# Patient Record
Sex: Female | Born: 1935 | Race: White | Hispanic: No | Marital: Married | Smoking: Former smoker
Health system: Southern US, Community
[De-identification: ages and names within clinical notes are randomized; demographics above are authoritative.]

## PROBLEM LIST (undated history)

## (undated) DIAGNOSIS — I10 Essential (primary) hypertension: Secondary | ICD-10-CM

## (undated) DIAGNOSIS — K5730 Diverticulosis of large intestine without perforation or abscess without bleeding: Secondary | ICD-10-CM

## (undated) DIAGNOSIS — M810 Age-related osteoporosis without current pathological fracture: Secondary | ICD-10-CM

## (undated) DIAGNOSIS — E559 Vitamin D deficiency, unspecified: Secondary | ICD-10-CM

## (undated) DIAGNOSIS — J449 Chronic obstructive pulmonary disease, unspecified: Secondary | ICD-10-CM

## (undated) DIAGNOSIS — E785 Hyperlipidemia, unspecified: Secondary | ICD-10-CM

## (undated) DIAGNOSIS — C801 Malignant (primary) neoplasm, unspecified: Secondary | ICD-10-CM

## (undated) HISTORY — DX: Essential (primary) hypertension: I10

## (undated) HISTORY — DX: Hyperlipidemia, unspecified: E78.5

## (undated) HISTORY — DX: Age-related osteoporosis without current pathological fracture: M81.0

## (undated) HISTORY — DX: Vitamin D deficiency, unspecified: E55.9

## (undated) HISTORY — DX: Diverticulosis of large intestine without perforation or abscess without bleeding: K57.30

## (undated) HISTORY — DX: Chronic obstructive pulmonary disease, unspecified: J44.9

---

## 1997-10-07 ENCOUNTER — Other Ambulatory Visit: Admission: RE | Admit: 1997-10-07 | Discharge: 1997-10-07 | Payer: Self-pay | Admitting: *Deleted

## 1998-01-02 ENCOUNTER — Other Ambulatory Visit: Admission: RE | Admit: 1998-01-02 | Discharge: 1998-01-02 | Payer: Self-pay | Admitting: Gastroenterology

## 1998-05-22 ENCOUNTER — Ambulatory Visit (HOSPITAL_COMMUNITY): Admission: RE | Admit: 1998-05-22 | Discharge: 1998-05-22 | Payer: Self-pay | Admitting: Gastroenterology

## 1999-08-07 ENCOUNTER — Other Ambulatory Visit: Admission: RE | Admit: 1999-08-07 | Discharge: 1999-08-07 | Payer: Self-pay | Admitting: *Deleted

## 1999-08-10 ENCOUNTER — Encounter: Payer: Self-pay | Admitting: *Deleted

## 1999-08-10 ENCOUNTER — Encounter: Admission: RE | Admit: 1999-08-10 | Discharge: 1999-08-10 | Payer: Self-pay | Admitting: *Deleted

## 2001-11-27 ENCOUNTER — Encounter: Admission: RE | Admit: 2001-11-27 | Discharge: 2001-11-27 | Payer: Self-pay

## 2001-12-07 ENCOUNTER — Other Ambulatory Visit: Admission: RE | Admit: 2001-12-07 | Discharge: 2001-12-07 | Payer: Self-pay | Admitting: Family Medicine

## 2002-06-02 ENCOUNTER — Encounter: Payer: Self-pay | Admitting: *Deleted

## 2002-06-02 ENCOUNTER — Encounter: Admission: RE | Admit: 2002-06-02 | Discharge: 2002-06-02 | Payer: Self-pay | Admitting: *Deleted

## 2002-12-17 ENCOUNTER — Encounter: Admission: RE | Admit: 2002-12-17 | Discharge: 2002-12-17 | Payer: Self-pay

## 2003-12-19 ENCOUNTER — Ambulatory Visit (HOSPITAL_COMMUNITY): Admission: RE | Admit: 2003-12-19 | Discharge: 2003-12-19 | Payer: Self-pay | Admitting: Family Medicine

## 2004-03-23 ENCOUNTER — Encounter: Payer: Self-pay | Admitting: Internal Medicine

## 2005-01-31 ENCOUNTER — Encounter: Admission: RE | Admit: 2005-01-31 | Discharge: 2005-01-31 | Payer: Self-pay | Admitting: Family Medicine

## 2005-02-22 ENCOUNTER — Ambulatory Visit (HOSPITAL_COMMUNITY): Admission: RE | Admit: 2005-02-22 | Discharge: 2005-02-22 | Payer: Self-pay | Admitting: Family Medicine

## 2005-03-07 HISTORY — PX: VERTEBROPLASTY: SHX113

## 2005-03-22 ENCOUNTER — Encounter: Admission: RE | Admit: 2005-03-22 | Discharge: 2005-03-22 | Payer: Self-pay | Admitting: Family Medicine

## 2005-03-28 ENCOUNTER — Encounter: Admission: RE | Admit: 2005-03-28 | Discharge: 2005-03-28 | Payer: Self-pay | Admitting: Orthopedic Surgery

## 2005-03-29 ENCOUNTER — Encounter (INDEPENDENT_AMBULATORY_CARE_PROVIDER_SITE_OTHER): Payer: Self-pay | Admitting: *Deleted

## 2005-03-29 ENCOUNTER — Encounter: Admission: RE | Admit: 2005-03-29 | Discharge: 2005-03-29 | Payer: Self-pay | Admitting: Family Medicine

## 2005-04-29 ENCOUNTER — Encounter: Admission: RE | Admit: 2005-04-29 | Discharge: 2005-04-29 | Payer: Self-pay | Admitting: Orthopedic Surgery

## 2005-05-09 ENCOUNTER — Encounter: Admission: RE | Admit: 2005-05-09 | Discharge: 2005-05-09 | Payer: Self-pay | Admitting: Orthopedic Surgery

## 2005-07-05 HISTORY — PX: CYSTOSCOPY: SUR368

## 2006-03-03 ENCOUNTER — Ambulatory Visit (HOSPITAL_COMMUNITY): Admission: RE | Admit: 2006-03-03 | Discharge: 2006-03-03 | Payer: Self-pay | Admitting: Family Medicine

## 2006-06-04 ENCOUNTER — Encounter: Admission: RE | Admit: 2006-06-04 | Discharge: 2006-06-04 | Payer: Self-pay | Admitting: Family Medicine

## 2007-03-06 ENCOUNTER — Ambulatory Visit (HOSPITAL_COMMUNITY): Admission: RE | Admit: 2007-03-06 | Discharge: 2007-03-06 | Payer: Self-pay | Admitting: *Deleted

## 2007-04-24 ENCOUNTER — Encounter: Payer: Self-pay | Admitting: Internal Medicine

## 2007-04-24 LAB — CONVERTED CEMR LAB
AST: 27 units/L
Albumin: 4.6 g/dL
Alkaline Phosphatase: 70 units/L
BUN: 15 mg/dL
Basophils Relative: 1 %
Calcium: 9.9 mg/dL
Cholesterol: 231 mg/dL
Creatinine, Ser: 0.64 mg/dL
Glucose, Bld: 95 mg/dL
HCT: 42.7 %
Hemoglobin: 14.1 g/dL
Platelets: 275 10*3/uL
RBC: 4.5 M/uL
Sodium: 141 meq/L
WBC: 5.9 10*3/uL

## 2007-07-06 LAB — HM COLONOSCOPY: HM Colonoscopy: NORMAL

## 2008-03-02 ENCOUNTER — Encounter: Admission: RE | Admit: 2008-03-02 | Discharge: 2008-03-02 | Payer: Self-pay | Admitting: Orthopedic Surgery

## 2008-07-11 ENCOUNTER — Ambulatory Visit (HOSPITAL_COMMUNITY): Admission: RE | Admit: 2008-07-11 | Discharge: 2008-07-11 | Payer: Self-pay | Admitting: Family Medicine

## 2009-03-08 ENCOUNTER — Encounter: Admission: RE | Admit: 2009-03-08 | Discharge: 2009-03-08 | Payer: Self-pay | Admitting: Orthopedic Surgery

## 2009-03-08 ENCOUNTER — Encounter: Payer: Self-pay | Admitting: Orthopedic Surgery

## 2009-03-10 ENCOUNTER — Ambulatory Visit (HOSPITAL_COMMUNITY): Admission: RE | Admit: 2009-03-10 | Discharge: 2009-03-10 | Payer: Self-pay | Admitting: Interventional Radiology

## 2009-03-17 LAB — HM MAMMOGRAPHY

## 2009-03-18 ENCOUNTER — Encounter: Admission: RE | Admit: 2009-03-18 | Discharge: 2009-03-18 | Payer: Self-pay | Admitting: Family Medicine

## 2009-03-22 ENCOUNTER — Encounter: Payer: Self-pay | Admitting: Internal Medicine

## 2009-03-22 LAB — CONVERTED CEMR LAB
Albumin: 4.2 g/dL
CO2: 26 meq/L
Chloride: 100 meq/L
Creatinine, Ser: 0.58 mg/dL
Eosinophils Relative: 2 %
MCV: 93 fL
Neutrophils Relative %: 68 %
Potassium: 4.3 meq/L
RBC: 4.35 M/uL
RDW: 12.8 %
Total Bilirubin: 0.4 mg/dL
Total Protein: 6.8 g/dL

## 2009-03-24 ENCOUNTER — Encounter: Payer: Self-pay | Admitting: Interventional Radiology

## 2009-10-12 ENCOUNTER — Encounter: Payer: Self-pay | Admitting: Internal Medicine

## 2009-10-12 LAB — CONVERTED CEMR LAB
AST: 36 units/L
Basophils Relative: 0 %
CO2: 24 meq/L
Cholesterol: 239 mg/dL
Eosinophils Relative: 1 %
LDL Cholesterol: 104 mg/dL
Lymphocytes, automated: 20 %
Neutrophils Relative %: 69 %
Platelets: 286 10*3/uL
Potassium: 4.6 meq/L
RDW: 13.7 %
Sodium: 138 meq/L
Triglyceride fasting, serum: 91 mg/dL
WBC: 9 10*3/uL

## 2009-11-17 ENCOUNTER — Encounter: Payer: Self-pay | Admitting: Internal Medicine

## 2009-11-17 ENCOUNTER — Emergency Department (HOSPITAL_COMMUNITY): Admission: EM | Admit: 2009-11-17 | Discharge: 2009-11-17 | Payer: Self-pay | Admitting: Emergency Medicine

## 2009-11-17 LAB — CONVERTED CEMR LAB
BUN: 6 mg/dL
Basophils Relative: 0 %
Creatinine, Ser: 0.5 mg/dL
Glucose, Bld: 95 mg/dL
Hemoglobin: 16.3 g/dL
Platelets: 241 10*3/uL
RBC: 4.53 M/uL
Sodium: 138 meq/L
WBC: 5.9 10*3/uL

## 2009-12-19 ENCOUNTER — Encounter: Admission: RE | Admit: 2009-12-19 | Discharge: 2009-12-19 | Payer: Self-pay | Admitting: Orthopedic Surgery

## 2010-01-11 ENCOUNTER — Encounter: Payer: Self-pay | Admitting: Internal Medicine

## 2010-03-15 ENCOUNTER — Encounter: Payer: Self-pay | Admitting: Internal Medicine

## 2010-03-15 DIAGNOSIS — M810 Age-related osteoporosis without current pathological fracture: Secondary | ICD-10-CM | POA: Insufficient documentation

## 2010-03-19 ENCOUNTER — Ambulatory Visit (INDEPENDENT_AMBULATORY_CARE_PROVIDER_SITE_OTHER): Payer: Medicare Other | Admitting: Internal Medicine

## 2010-03-19 ENCOUNTER — Encounter: Payer: Self-pay | Admitting: Internal Medicine

## 2010-03-19 ENCOUNTER — Other Ambulatory Visit: Payer: Self-pay | Admitting: Internal Medicine

## 2010-03-19 DIAGNOSIS — E559 Vitamin D deficiency, unspecified: Secondary | ICD-10-CM | POA: Insufficient documentation

## 2010-03-19 DIAGNOSIS — I10 Essential (primary) hypertension: Secondary | ICD-10-CM

## 2010-03-19 DIAGNOSIS — Z9189 Other specified personal risk factors, not elsewhere classified: Secondary | ICD-10-CM | POA: Insufficient documentation

## 2010-03-19 DIAGNOSIS — Z1231 Encounter for screening mammogram for malignant neoplasm of breast: Secondary | ICD-10-CM

## 2010-03-19 DIAGNOSIS — K5730 Diverticulosis of large intestine without perforation or abscess without bleeding: Secondary | ICD-10-CM | POA: Insufficient documentation

## 2010-03-19 DIAGNOSIS — M810 Age-related osteoporosis without current pathological fracture: Secondary | ICD-10-CM

## 2010-03-19 DIAGNOSIS — E785 Hyperlipidemia, unspecified: Secondary | ICD-10-CM | POA: Insufficient documentation

## 2010-03-28 NOTE — Assessment & Plan Note (Signed)
Summary: NEW/MEDICARE/UHC/NWS #   Vital Signs:  Patient profile:   77 year old female Height:      58 inches (147.32 cm) Weight:      92.12 pounds (41.87 kg) BMI:     19.32 O2 Sat:      95 % on Room air Temp:     98.5 degrees F (36.94 degrees C) oral Pulse rate:   84 / minute BP sitting:   130 / 86  (left arm) Cuff size:   regular  Vitals Entered By: Orlan Leavens RMA (March 19, 2010 8:12 AM)  O2 Flow:  Room air CC: New patient Is Patient Diabetic? No Pain Assessment Patient in pain? no        Primary Care Provider:  Newt Lukes MD  CC:  New patient.  History of Present Illness: new pt to me and our practice, here to est care prev with mazzochi and stone  reviewed chronic med issues: HTN - started ARB for same fall 2011 -reports compliance with ongoing medical treatment and no changes in medication dose or frequency. denies adverse side effects related to current therapy.   osteoporosis - hx compression fx, KP T10 03/2009-  prev tx efforts with actonel, boniva and fosamax untol due to GI SE - takes Calcium + vit d daily - WB exercise - walking as tol -   dyslipidemia - never rec rx med for same - controls with diet and exercise  Preventive Screening-Counseling & Management  Alcohol-Tobacco     Alcohol drinks/day: <1     Alcohol Counseling: not indicated; patient does not drink     Smoking Status: quit     Year Started: 1980     Tobacco Counseling: not to resume use of tobacco products  Caffeine-Diet-Exercise     Does Patient Exercise: no     Exercise Counseling: not indicated; exercise is adequate     Depression Counseling: not indicated; screening negative for depression  Safety-Violence-Falls     Seat Belt Counseling: not indicated; patient wears seat belts     Helmet Counseling: not applicable     Firearm Counseling: not indicated; uses recommended firearm safety measures     Violence Counseling: not indicated; no violence risk noted     Fall Risk  Counseling: not indicated; no significant falls noted  Clinical Review Panels:  Prevention   [facility]  [facility] No specific mammographic evidence of malignancy.  Assessment: BIRADS 1. (03/17/2009)   Last Colonoscopy:  Pt states done by Dr. Ewing Schlein, results normal (07/06/2007)  Immunizations   Last Tetanus Booster:  Historical (02/04/1997)   Last Flu Vaccine:  Historical (11/04/2009)  Lipid Management   Cholesterol:  239 (10/12/2009)   LDL (bad choesterol):  104 (10/12/2009)   HDL (good cholesterol):  117 (10/12/2009)   Triglycerides:  91 (10/12/2009)  CBC   WBC:  5.9 (11/17/2009)   RBC:  4.53 (11/17/2009)   Hgb:  16.3 (11/17/2009)   Hct:  48.0 (11/17/2009)   Platelets:  241 (11/17/2009)   MCV  94.9 (11/17/2009)   RDW  13.9 (11/17/2009)   PMN:  64 (11/17/2009)   Monos:  11 (11/17/2009)   Eosinophils:  5 (11/17/2009)   Basophil:  0 (11/17/2009)  Complete Metabolic Panel   Glucose:  95 (11/17/2009)   Sodium:  138 (11/17/2009)   Potassium:  4.5 (11/17/2009)   Chloride:  101 (11/17/2009)   CO2:  24 (10/12/2009)   BUN:  6 (11/17/2009)   Creatinine:  0.5 (  11/17/2009)   Albumin:  4.5 (10/12/2009)   Total Protein:  7.7 (10/12/2009)   Calcium:  10.3 (10/12/2009)   Total Bili:  0.6 (10/12/2009)   Alk Phos:  81 (10/12/2009)   SGPT (ALT):  19 (10/12/2009)   SGOT (AST):  36 (10/12/2009)   -  Date:  10/12/2009    TSH: 0.776    CO2 Total: 24    SGOT (AST): 36    SGPT (ALT): 19    T. Bilirubin: 0.6    Alk Phos: 81    Calcium: 10.3    Total Protein: 7.7    Albumin: 4.5    Cholesterol: 239    LDL: 104    HDL: 117    Triglycerides: 91  Date:  03/22/2009    CO2 Total: 26    SGOT (AST): 24    SGPT (ALT): 11    T. Bilirubin: 0.4    Alk Phos: 106    Calcium: 9.2    Total Protein: 6.8    Albumin: 4.2  Date:  04/24/2007    CO2 Total: 24    SGOT (AST): 27    SGPT (ALT): 10    T. Bilirubin: 0.6    Alk Phos: 70    Calcium: 9.9    Total Protein:  7.5    Albumin: 4.6    Cholesterol: 231    LDL: 108    HDL: 101    Triglycerides: 112  Current Medications (verified): 1)  Gummy Multivitamin .... Once Daily 2)  Losartan Potassium 25 Mg Tabs (Losartan Potassium) .... Take 1 By Mouth Once Daily 3)  Citracal Calcium+d 600-40-500 Mg-Mg-Unit Xr24h-Tab (Calcium-Magnesium-Vitamin D) .... Take 2 By Mouth Once Daily 4)  Vitamin D 1000 Unit Tabs (Cholecalciferol) .... Take 1 By Mouth Once Daily  Allergies: 1)  ! Penicillin 2)  ! Aspirin 3)  ! * Shrimp  Past History:  Past Medical History: Osteoporosis Hypertension Diverticulosis, colon Hyperlipidemia  MD roster: ortho - [facility] - [facility]  Past Surgical History: Cystoscopy (07/2005)  Vertebroplasty (03/2005) Dr. Deanne Coffer  Family History: Mother died in her 88's from heart disease, HTN Father died @ 75 from heart disease, HTN 1 brother died from pancreatic cancer age 3y  Social History: Former Smoker, quit 15 years ago married, lives with spouse retired Does Patient Exercise:  no  Review of Systems       see HPI above. I have reviewed all other systems and they were negative.   Physical Exam  General:  small, fit woman, NAD - alert, well-developed, well-nourished, and cooperative to examination.    Head:  Normocephalic and atraumatic without obvious abnormalities. No apparent alopecia or balding. Eyes:  vision grossly intact; pupils equal, round and reactive to light.  conjunctiva and lids normal.    Ears:  R ear normal and L ear normal.   Mouth:  teeth in good repair, receeding gum line; mucous membranes moist, without lesions or ulcers. oropharynx clear without exudate, erythema.  Lungs:  normal respiratory effort, no intercostal retractions or use of accessory muscles; normal breath sounds bilaterally - no crackles and no wheezes.    Heart:  normal rate, regular rhythm, no murmur, and no rub. BLE without edema. normal DP pulses and normal cap refill in all 4  extremities    Abdomen:  soft, non-tender, normal bowel sounds, no distention; no masses and no appreciable hepatomegaly or splenomegaly.   Genitalia:  defer Msk:  No deformity or scoliosis noted of thoracic or lumbar spine.  Neurologic:  alert & oriented X3 and cranial nerves II-XII symetrically intact.  strength normal in all extremities, sensation intact to light touch, and gait normal. speech fluent without dysarthria or aphasia; follows commands with good comprehension.  Skin:  no rashes, vesicles, ulcers, or erythema. No nodules or irregularity to palpation. L palmar surface of thumb with tendon cyst Psych:  Oriented X3, memory intact for recent and remote, normally interactive, good eye contact, not anxious appearing, not depressed appearing, and not agitated.      Impression & Recommendations:  Problem # 1:  HYPERTENSION (ICD-401.9)  inc dose ARB now for better control reviewed prior PCP notes and hx/labs -  Her updated medication list for this problem includes:    Losartan Potassium 50 Mg Tabs (Losartan potassium) .Marland Kitchen... 1 by mouth once daily  BP today: 130/86  Labs Reviewed: K+: 4.5 (11/17/2009) Creat: : 0.5 (11/17/2009)   Chol: 239 (10/12/2009)   HDL: 117 (10/12/2009)   LDL: 104 (10/12/2009)   TG: 91 (10/12/2009)  Orders: Prescription Created Electronically [phone])  Problem # 2:  OSTEOPOROSIS (ICD-733.00) intol several prior oral tx due to GI SE - consider reclast/prolia hx T10 comp fx and KP reviewed -  cont Ca+D as ongoing  Complete Medication List: 1)  Gummy Multivitamin  .... Once daily 2)  Losartan Potassium 50 Mg Tabs (Losartan potassium) .Marland Kitchen.. 1 by mouth once daily 3)  Citracal Calcium+d 600-40-500 Mg-mg-unit Xr24h-tab (Calcium-magnesium-vitamin d) .... Take 2 by mouth once daily 4)  Vitamin D 1000 Unit Tabs (Cholecalciferol) .... Take 1 by mouth once daily  Patient Instructions: 1)  it was good to see you today.  2)  your medical history and medications have  been reviewed today 3)  increase losartan to 50mg  once daily - your prescription has been electronically submitted to your pharmacy. Please take as directed. Contact our office if you believe you're having problems with the medication(s).  4)  read about reclast and prolia - options for treating your osteoporosis - we will choose one of these treatment options next visit and do labs (calcium, kidney function) 5)  Please schedule a follow-up appointment in 6 weeks to recheck blood pressure and treatment for your bones, call sooner if problems.  Prescriptions: LOSARTAN POTASSIUM 50 MG TABS (LOSARTAN POTASSIUM) 1 by mouth once daily  #90 x 1   Entered and Authorized by:   Newt Lukes MD   Signed by:   Newt Lukes MD on 03/19/2010   Method used:   Electronically to        [facility]. [phone]* (retail)       [address]       [place], [place]  [address]       Ph: [phone]       Fax: [phone]   RxID:   [id number]    Orders Added: 1)  New Patient Level III [27253] 2)  Prescription Created Electronically [phone]   Immunization History:  Influenza Immunization History:    Influenza:  historical (11/04/2009)  Tetanus/Td Immunization History:    Tetanus/Td:  historical (02/04/1997)   Immunization History:  Influenza Immunization History:    Influenza:  Historical (11/04/2009)  Tetanus/Td Immunization History:    Tetanus/Td:  Historical (02/04/1997)    Colonoscopy  Procedure date:  07/06/2007  Findings:      Pt states done by Dr. Ewing Schlein, results normal  Bone Density  Procedure date:  01/11/2010  Findings:  Done @ [facility]  Comments:      Assessment:  Osteoporosis.

## 2010-03-29 ENCOUNTER — Ambulatory Visit (HOSPITAL_COMMUNITY)
Admission: RE | Admit: 2010-03-29 | Discharge: 2010-03-29 | Disposition: A | Discharge status: Home / Self Care | Payer: Medicare Other | Source: Ambulatory Visit | Attending: Internal Medicine | Admitting: Internal Medicine

## 2010-03-29 ENCOUNTER — Encounter: Payer: Self-pay | Admitting: Internal Medicine

## 2010-03-29 DIAGNOSIS — Z1231 Encounter for screening mammogram for malignant neoplasm of breast: Secondary | ICD-10-CM

## 2010-04-09 ENCOUNTER — Encounter: Payer: Self-pay | Admitting: Internal Medicine

## 2010-04-09 ENCOUNTER — Other Ambulatory Visit: Payer: Self-pay | Admitting: Internal Medicine

## 2010-04-09 ENCOUNTER — Ambulatory Visit (INDEPENDENT_AMBULATORY_CARE_PROVIDER_SITE_OTHER): Payer: Medicare Other | Admitting: Internal Medicine

## 2010-04-09 ENCOUNTER — Other Ambulatory Visit: Payer: Medicare Other

## 2010-04-09 DIAGNOSIS — I10 Essential (primary) hypertension: Secondary | ICD-10-CM

## 2010-04-09 LAB — BASIC METABOLIC PANEL
CO2: 28 mEq/L (ref 19–32)
Calcium: 9.8 mg/dL (ref 8.4–10.5)
Chloride: 99 mEq/L (ref 96–112)
Sodium: 137 mEq/L (ref 135–145)

## 2010-04-17 NOTE — Assessment & Plan Note (Signed)
Summary: BP IS ELEV/NWS   Vital Signs:  Patient profile:   77 year old female Height:      58 inches (147.32 cm) Weight:      92.25 pounds (41.93 kg) BMI:     19.35 O2 Sat:      96 % on Room air Temp:     97.5 degrees F (36.39 degrees C) oral Pulse rate:   84 / minute Resp:     14 per minute BP sitting:   138 / 74  (left arm) Cuff size:   regular  Vitals Entered By: Burnard Leigh CMA(AAMA) (April 09, 2010 10:58 AM)  O2 Flow:  Room air CC: Pt had elevated BP reading at Plastic Surgeon office this AM/sls,cma   Primary Care Provider:  Newt Lukes MD  CC:  Pt had elevated BP reading at Plastic Surgeon office this AM/sls and cma.  History of Present Illness: here for elev BP - noted BP 188/100 prior to procedure at plastic surg office this AM (local anesth for face injection) no CP, Ha or vision changes - BP has been 110-120s since inc ARB dose few weeks ago (also caused fatigue)  also reviewed chronic med issues: HTN - started ARB for same fall 2011, dose inc 03/2010 -reports compliance with ongoing medical treatment and no changes in medication dose or frequency. denies adverse side effects related to current therapy.   osteoporosis - hx compression fx, KP T10 03/2009-  prev tx efforts with actonel, boniva and fosamax untol due to GI SE - takes Calcium + vit d daily - WB exercise - walking as tol -   dyslipidemia - never rec rx med for same - controls with diet and exercise  Clinical Review Panels:  Prevention   [facility]  [facility] No specific mammographic evidence of malignancy.  Assessment: BIRADS 1. (03/17/2009)   Last Colonoscopy:  Pt states done by Dr. Ewing Schlein, results normal (07/06/2007)  Lipid Management   Cholesterol:  239 (10/12/2009)   LDL (bad choesterol):  104 (10/12/2009)   HDL (good cholesterol):  117 (10/12/2009)   Triglycerides:  91 (10/12/2009)  CBC   WBC:  5.9 (11/17/2009)   RBC:  4.53 (11/17/2009)   Hgb:  16.3 (11/17/2009)  Hct:  48.0 (11/17/2009)   Platelets:  241 (11/17/2009)   MCV  94.9 (11/17/2009)   RDW  13.9 (11/17/2009)   PMN:  64 (11/17/2009)   Monos:  11 (11/17/2009)   Eosinophils:  5 (11/17/2009)   Basophil:  0 (11/17/2009)  Complete Metabolic Panel   Glucose:  95 (11/17/2009)   Sodium:  138 (11/17/2009)   Potassium:  4.5 (11/17/2009)   Chloride:  101 (11/17/2009)   CO2:  24 (10/12/2009)   BUN:  6 (11/17/2009)   Creatinine:  0.5 (11/17/2009)   Albumin:  4.5 (10/12/2009)   Total Protein:  7.7 (10/12/2009)   Calcium:  10.3 (10/12/2009)   Total Bili:  0.6 (10/12/2009)   Alk Phos:  81 (10/12/2009)   SGPT (ALT):  19 (10/12/2009)   SGOT (AST):  36 (10/12/2009)   Current Medications (verified): 1)  Gummy Multivitamin .... Two Times A Day 2)  Losartan Potassium 50 Mg Tabs (Losartan Potassium) .Marland Kitchen.. 1 By Mouth Once Daily 3)  Citracal Calcium+d 600-40-500 Mg-Mg-Unit Xr24h-Tab (Calcium-Magnesium-Vitamin D) .... Take 2 By Mouth Once Daily 4)  Vitamin D 1000 Unit Tabs (Cholecalciferol) .... Take 1 By Mouth Once Daily  Allergies (verified): 1)  ! Penicillin 2)  ! Aspirin 3)  ! *  Shrimp  Past History:  Past Medical History: Osteoporosis Hypertension Diverticulosis, colon Hyperlipidemia  MD roster: ortho - [facility] - [facility] - [facility]  Review of Systems  The patient denies vision loss, chest pain, syncope, headaches, and abdominal pain.    Physical Exam  General:  small, fit woman, NAD - alert, well-developed, well-nourished, and cooperative to examination.    Lungs:  normal respiratory effort, no intercostal retractions or use of accessory muscles; normal breath sounds bilaterally - no crackles and no wheezes.    Heart:  normal rate, regular rhythm, no murmur, and no rub. BLE without edema. Neurologic:  alert & oriented X3 and cranial nerves II-XII symetrically intact.  strength normal in all extremities, sensation intact to light touch, and gait normal. speech fluent  without dysarthria or aphasia; follows commands with good comprehension.    Impression & Recommendations:  Problem # 1:  HYPERTENSION (ICD-401.9)  Her updated medication list for this problem includes:    Losartan Potassium 50 Mg Tabs (Losartan potassium) .Marland Kitchen... 1 by mouth once daily    Hydrochlorothiazide 12.5 Mg Tabs (Hydrochlorothiazide) .Marland Kitchen... 1 by mouth once daily as needed for sbp >140  Orders: TLB-BMP (Basic Metabolic Panel-BMET) (80048-METABOL) Prescription Created Electronically [phone])  s/p titration ARB 03/2010 for better control - improved until anxiety provoking situation today - BP now normal again add as needed diuretic for SBP>140 and xanax as needed for upcoming procedure  BP today: 138/74 Prior BP: 130/86 (03/19/2010)  Labs Reviewed: K+: 4.5 (11/17/2009) Creat: : 0.5 (11/17/2009)   Chol: 239 (10/12/2009)   HDL: 117 (10/12/2009)   LDL: 104 (10/12/2009)   TG: 91 (10/12/2009)  Complete Medication List: 1)  Gummy Multivitamin  .... Two times a day 2)  Losartan Potassium 50 Mg Tabs (Losartan potassium) .Marland Kitchen.. 1 by mouth once daily 3)  Citracal Calcium+d 600-40-500 Mg-mg-unit Xr24h-tab (Calcium-magnesium-vitamin d) .... Take 2 by mouth once daily 4)  Vitamin D 1000 Unit Tabs (Cholecalciferol) .... Take 1 by mouth once daily 5)  Hydrochlorothiazide 12.5 Mg Tabs (Hydrochlorothiazide) .Marland Kitchen.. 1 by mouth once daily as needed for sbp >140 6)  Alprazolam 0.25 Mg Tabs (Alprazolam) .Marland Kitchen.. 1 by mouth every 12hours as needed for anxiety symptoms  Patient Instructions: 1)  it was good to see you today. 2)  test(s) ordered today - your results will be called to you after review 3)  use diuretic as needed for SBP >140 and generic low dose xanax as needed for anxiety - your prescriptions have been electronically to your pharmacy. Please take as directed. Contact our office if you believe you're having problems with the medication(s).  4)  Please reschedule your follow-up appointment from  3/26 to 3-4 months from now, call sooner if problems.  Prescriptions: ALPRAZOLAM 0.25 MG TABS (ALPRAZOLAM) 1 by mouth every 12hours as needed for anxiety symptoms  #10 x 0   Entered and Authorized by:   Newt Lukes MD   Signed by:   Newt Lukes MD on 04/09/2010   Method used:   Printed then faxed to ...       [facility] [phone]* (retail)       [address]       [place], [place]  [address]       Ph: [phone]       Fax: [phone]   RxID:   [id number] HYDROCHLOROTHIAZIDE 12.5 MG TABS (HYDROCHLOROTHIAZIDE) 1 by mouth once daily as needed for SBP >  140  #30 x 3   Entered and Authorized by:   Newt Lukes MD   Signed by:   Newt Lukes MD on 04/09/2010   Method used:   Electronically to        [facility]. [phone]* (retail)       [address]       [place], [place]  [address]       Ph: [phone]       Fax: [phone]   RxID:   [id number]    Orders Added: 1)  TLB-BMP (Basic Metabolic Panel-BMET) [80048-METABOL] 2)  Est. Patient Level IV [75643] 3)  Prescription Created Electronically [phone]

## 2010-04-19 LAB — POCT CARDIAC MARKERS
CKMB, poc: 2.6 ng/mL (ref 1.0–8.0)
Myoglobin, poc: 57 ng/mL (ref 12–200)
Troponin i, poc: <0.05 ng/mL (ref 0.00–0.09)

## 2010-04-19 LAB — CBC
HCT: 43 % (ref 36.0–46.0)
MCV: 94.9 fL (ref 78.0–100.0)
Platelets: 241 10*3/uL (ref 150–400)
RBC: 4.53 MIL/uL (ref 3.87–5.11)
WBC: 5.9 10*3/uL (ref 4.0–10.5)

## 2010-04-19 LAB — D-DIMER, QUANTITATIVE: D-Dimer, Quant: 1.77 ug/mL-FEU — ABNORMAL HIGH (ref 0.00–0.48)

## 2010-04-19 LAB — POCT I-STAT, CHEM 8
BUN: 6 mg/dL (ref 6–23)
Chloride: 101 mEq/L (ref 96–112)
HCT: 48 % — ABNORMAL HIGH (ref 36.0–46.0)
Potassium: 4.5 mEq/L (ref 3.5–5.1)
Sodium: 138 mEq/L (ref 135–145)

## 2010-04-19 LAB — URINALYSIS, ROUTINE W REFLEX MICROSCOPIC
Glucose, UA: NEGATIVE mg/dL
Ketones, ur: NEGATIVE mg/dL
Nitrite: NEGATIVE
Specific Gravity, Urine: 1.008 (ref 1.005–1.030)
pH: 7 (ref 5.0–8.0)

## 2010-04-19 LAB — DIFFERENTIAL
Basophils Absolute: 0 10*3/uL (ref 0.0–0.1)
Eosinophils Relative: 5 % (ref 0–5)
Lymphocytes Relative: 20 % (ref 12–46)
Lymphs Abs: 1.1 10*3/uL (ref 0.7–4.0)
Neutro Abs: 3.7 10*3/uL (ref 1.7–7.7)

## 2010-04-19 LAB — BRAIN NATRIURETIC PEPTIDE: Pro B Natriuretic peptide (BNP): 53.9 pg/mL (ref 0.0–100.0)

## 2010-04-25 ENCOUNTER — Encounter: Payer: Self-pay | Admitting: *Deleted

## 2010-04-26 LAB — CBC
HCT: 44.2 % (ref 36.0–46.0)
Platelets: 283 10*3/uL (ref 150–400)
RDW: 13.1 % (ref 11.5–15.5)
WBC: 5.9 10*3/uL (ref 4.0–10.5)

## 2010-04-26 LAB — BASIC METABOLIC PANEL
BUN: 17 mg/dL (ref 6–23)
Creatinine, Ser: 0.64 mg/dL (ref 0.4–1.2)
GFR calc non Af Amer: >60 mL/min (ref >60)
Glucose, Bld: 159 mg/dL — ABNORMAL HIGH (ref 70–99)

## 2010-04-26 LAB — APTT: aPTT: 33 seconds (ref 24–37)

## 2010-04-26 LAB — PROTIME-INR: Prothrombin Time: 13.3 seconds (ref 11.6–15.2)

## 2010-04-30 ENCOUNTER — Ambulatory Visit: Payer: Medicare Other | Admitting: Internal Medicine

## 2010-06-04 ENCOUNTER — Telehealth: Payer: Self-pay

## 2010-06-04 MED ORDER — ALBUTEROL 90 MCG/ACT IN AERS: 2.0000 | INHALATION_SPRAY | Freq: Four times a day (QID) | RESPIRATORY_TRACT | Status: DC | PRN

## 2010-06-04 NOTE — Telephone Encounter (Signed)
Pt request refill of Ventolin inhaler she was given while in hospital 11/2009. Pt states she uses it occasionally and has advised Dr Felicity Coyer of same at [facility]. Pt has ROV scheduled in June 2012

## 2010-07-03 ENCOUNTER — Ambulatory Visit (INDEPENDENT_AMBULATORY_CARE_PROVIDER_SITE_OTHER): Payer: Medicare Other | Admitting: Internal Medicine

## 2010-07-03 ENCOUNTER — Encounter: Payer: Self-pay | Admitting: Internal Medicine

## 2010-07-03 VITALS — BP 120/82 | HR 75 | Temp 97.5°F | Ht <= 58 in | Wt 91.2 lb

## 2010-07-03 DIAGNOSIS — I951 Orthostatic hypotension: Secondary | ICD-10-CM

## 2010-07-03 DIAGNOSIS — I10 Essential (primary) hypertension: Secondary | ICD-10-CM

## 2010-07-03 NOTE — Progress Notes (Signed)
  Subjective:    Patient ID: Bianca [name], female    DOB: 29-Aug-1930, 77 y.o.   MRN: [id number]  HPI  here for "weakness" - Ongoing x 3-4 weeks - describes as orthostatic: "lightheaded with movement" - symptoms worst in AM until lunch - no HA, falls or ear pressure/pain No vision changes or unilateral weakness No shortness of breath, dyspnea on exertion, chest pain or change meds No fever, no dysuria, no depression   also reviewed chronic med issues:   HTN - started ARB for same fall 2011, dose inc 03/2010 -reports compliance with ongoing medical treatment and no changes in medication dose or frequency. denies adverse side effects related to current therapy.   osteoporosis - hx compression fx, KP T10 03/2009- intol of prev tx efforts with actonel, boniva and fosamax due to GI SE - takes Calcium + vit d daily - WB exercise - walking as tol -   dyslipidemia - never rec rx med for same - controls with diet and exercise   Past Medical History  Diagnosis Date  . DIVERTICULOSIS, COLON   . VITAMIN D DEFICIENCY   . HYPERLIPIDEMIA   . HYPERTENSION   . OSTEOPOROSIS      Review of Systems  Respiratory: Negative for shortness of breath.   Cardiovascular: Negative for chest pain.  Musculoskeletal: Negative for joint swelling.  Neurological: Positive for light-headedness. Negative for tremors, seizures, syncope, numbness and headaches.       Objective:   Physical Exam BP 120/82  Pulse 75  Temp(Src) 97.5 F (36.4 C) (Oral)  Ht 4\' 10"  (1.473 m)  Wt 91 lb 3.2 oz (41.368 kg)  BMI 19.06 kg/m2  SpO2 97% Physical Exam  Constitutional: She is petite; oriented to person, place, and time. She appears well-developed and well-nourished. No distress.  Cardiovascular: Normal rate, regular rhythm and normal heart sounds.  No murmur heard. No BLE edema. Pulmonary/Chest: Effort normal and breath sounds normal. No respiratory distress. She has no wheezes.  Neuro: AAOx4, CN 2-12 symmetrically  intact, no nystagmus, balance and gait normal Psychiatric: She has a normal mood and affect. Her behavior is normal. Judgment and thought content normal.       BP Readings from Last 3 Encounters:  07/03/10 120/82  04/09/10 138/74  03/19/10 130/86   Lab Results  Component Value Date   WBC 5.9 11/17/2009   HGB 16.3* 11/17/2009   HCT 48.0* 11/17/2009   PLT 241 11/17/2009   CHOL 239 10/12/2009   HDL 117 10/12/2009   ALT 19 4/0/9811   AST 36 10/12/2009   NA 137 04/09/2010   K 4.4 04/09/2010   CL 99 04/09/2010   CREATININE 0.6 04/09/2010   BUN 14 04/09/2010   CO2 28 04/09/2010   TSH 0.776 10/12/2009   INR 1.02 03/10/2009     Assessment & Plan:  Orthostatic symptoms - suspect over treated HTN - reduce dose ARB now, off diuretic - exam otherwise benign - reviewed prior labs - if persisting symptoms, will plan lab eval next OV (4weeks), pt to call sooner if worse

## 2010-07-03 NOTE — Patient Instructions (Signed)
It was good to see you today. Reduce dose of losartan by 1/2 (take 25mg  daily) - Other medications reviewed, no other changes at this time. Please keep schedule followup in 07/2010 to review, call sooner if problems.

## 2010-07-03 NOTE — Assessment & Plan Note (Signed)
?  overtx at home contributing to orthostatic symptoms -  Off hctz and will reduce ARB dose - to call for repeat labs if persisting symptoms despite med changes, sooner if worse BP Readings from Last 3 Encounters:  07/03/10 120/82  04/09/10 138/74  03/19/10 130/86

## 2010-07-31 ENCOUNTER — Ambulatory Visit: Payer: PRIVATE HEALTH INSURANCE | Admitting: Internal Medicine

## 2010-11-13 ENCOUNTER — Other Ambulatory Visit: Payer: Self-pay | Admitting: Internal Medicine

## 2011-01-20 ENCOUNTER — Emergency Department (HOSPITAL_COMMUNITY): Payer: Medicare Other

## 2011-01-20 ENCOUNTER — Encounter (HOSPITAL_COMMUNITY): Payer: Self-pay | Admitting: *Deleted

## 2011-01-20 ENCOUNTER — Emergency Department (HOSPITAL_COMMUNITY)
Admission: EM | Admit: 2011-01-20 | Discharge: 2011-01-20 | Disposition: A | Discharge status: Home / Self Care | Payer: Medicare Other | Attending: Emergency Medicine | Admitting: Emergency Medicine

## 2011-01-20 DIAGNOSIS — R0989 Other specified symptoms and signs involving the circulatory and respiratory systems: Secondary | ICD-10-CM | POA: Insufficient documentation

## 2011-01-20 DIAGNOSIS — R1032 Left lower quadrant pain: Secondary | ICD-10-CM | POA: Insufficient documentation

## 2011-01-20 DIAGNOSIS — R05 Cough: Secondary | ICD-10-CM | POA: Insufficient documentation

## 2011-01-20 DIAGNOSIS — R0609 Other forms of dyspnea: Secondary | ICD-10-CM | POA: Insufficient documentation

## 2011-01-20 DIAGNOSIS — R197 Diarrhea, unspecified: Secondary | ICD-10-CM

## 2011-01-20 DIAGNOSIS — Z79899 Other long term (current) drug therapy: Secondary | ICD-10-CM | POA: Insufficient documentation

## 2011-01-20 DIAGNOSIS — M810 Age-related osteoporosis without current pathological fracture: Secondary | ICD-10-CM | POA: Insufficient documentation

## 2011-01-20 DIAGNOSIS — E785 Hyperlipidemia, unspecified: Secondary | ICD-10-CM | POA: Insufficient documentation

## 2011-01-20 DIAGNOSIS — R0602 Shortness of breath: Secondary | ICD-10-CM | POA: Insufficient documentation

## 2011-01-20 DIAGNOSIS — R059 Cough, unspecified: Secondary | ICD-10-CM | POA: Insufficient documentation

## 2011-01-20 DIAGNOSIS — E860 Dehydration: Secondary | ICD-10-CM | POA: Insufficient documentation

## 2011-01-20 DIAGNOSIS — I10 Essential (primary) hypertension: Secondary | ICD-10-CM | POA: Insufficient documentation

## 2011-01-20 LAB — DIFFERENTIAL
Basophils Absolute: 0 10*3/uL (ref 0.0–0.1)
Basophils Relative: 1 % (ref 0–1)
Eosinophils Relative: 5 % (ref 0–5)
Lymphocytes Relative: 15 % (ref 12–46)
Monocytes Absolute: 0.5 10*3/uL (ref 0.1–1.0)
Neutro Abs: 5 10*3/uL (ref 1.7–7.7)

## 2011-01-20 LAB — URINALYSIS, ROUTINE W REFLEX MICROSCOPIC
Bilirubin Urine: NEGATIVE
Glucose, UA: NEGATIVE mg/dL
Hgb urine dipstick: NEGATIVE
Specific Gravity, Urine: 1.015 (ref 1.005–1.030)
pH: 5 (ref 5.0–8.0)

## 2011-01-20 LAB — CBC
HCT: 42.4 % (ref 36.0–46.0)
MCHC: 33 g/dL (ref 30.0–36.0)
MCV: 95.7 fL (ref 78.0–100.0)
Platelets: 287 10*3/uL (ref 150–400)
RDW: 12.9 % (ref 11.5–15.5)
WBC: 7 10*3/uL (ref 4.0–10.5)

## 2011-01-20 LAB — COMPREHENSIVE METABOLIC PANEL
ALT: 14 U/L (ref 0–35)
AST: 27 U/L (ref 0–37)
Albumin: 3.9 g/dL (ref 3.5–5.2)
Calcium: 9.5 mg/dL (ref 8.4–10.5)
Creatinine, Ser: 0.46 mg/dL — ABNORMAL LOW (ref 0.50–1.10)
Sodium: 136 mEq/L (ref 135–145)

## 2011-01-20 MED ORDER — DIPHENOXYLATE-ATROPINE 2.5-0.025 MG PO TABS
2.0000 | ORAL_TABLET | ORAL | Status: AC
  Administered 2011-01-20: 2 via ORAL
  Filled 2011-01-20: qty 2

## 2011-01-20 MED ORDER — ALBUTEROL SULFATE (5 MG/ML) 0.5% IN NEBU
INHALATION_SOLUTION | RESPIRATORY_TRACT | Status: AC
  Administered 2011-01-20: 5 mg via RESPIRATORY_TRACT
  Filled 2011-01-20: qty 1

## 2011-01-20 MED ORDER — SODIUM CHLORIDE 0.9 % IV BOLUS (SEPSIS)
500.0000 mL | Freq: Once | INTRAVENOUS | Status: AC
  Administered 2011-01-20: 1000 mL via INTRAVENOUS

## 2011-01-20 MED ORDER — SODIUM CHLORIDE 0.9 % IV SOLN
Freq: Once | INTRAVENOUS | Status: AC
  Administered 2011-01-20: 08:00:00 via INTRAVENOUS

## 2011-01-20 MED ORDER — ONDANSETRON 4 MG PO TBDP: 4.0000 mg | ORAL_TABLET | Freq: Three times a day (TID) | ORAL | Status: DC | PRN

## 2011-01-20 MED ORDER — ALBUTEROL SULFATE HFA 108 (90 BASE) MCG/ACT IN AERS: 2.0000 | INHALATION_SPRAY | RESPIRATORY_TRACT | Status: DC | PRN

## 2011-01-20 MED ORDER — DIPHENOXYLATE-ATROPINE 2.5-0.025 MG PO TABS: 1.0000 | ORAL_TABLET | Freq: Four times a day (QID) | ORAL | Status: DC | PRN

## 2011-01-20 MED ORDER — ALBUTEROL SULFATE (5 MG/ML) 0.5% IN NEBU
5.0000 mg | INHALATION_SOLUTION | RESPIRATORY_TRACT | Status: AC
  Administered 2011-01-20: 5 mg via RESPIRATORY_TRACT
  Filled 2011-01-20: qty 1

## 2011-01-20 NOTE — ED Notes (Signed)
Pt aware of the need for a urine sample however would like to wait until she gets IV fluids.Will check back later.

## 2011-01-20 NOTE — ED Notes (Signed)
ZOX:[id number]<AV> Expected date:01/20/11<BR> Expected time: 5:31 AM<BR> Means of arrival:Ambulance<BR> Comments:<BR> Shortness of breath, wheezing, diarrhea

## 2011-01-20 NOTE — ED Notes (Signed)
Pt c/o runny stool since Friday.  More than ten stools in 24hrs.  The pt states that she has not taken any meds for runny stool or SOB. She also c/o SOB and chest congestion.

## 2011-01-20 NOTE — Discharge Instructions (Signed)
Please followup with your family Dr. in the next 2 days for a recheck. Return to the emergency department if you develop severe or worsening diarrhea, vomiting, abdominal pain. Please use your albuterol inhaler 2 puffs every 4 hours as needed for shortness of breath

## 2011-01-20 NOTE — ED Notes (Signed)
Per EMS Pt is from home lives with  Husband c/o N/V/D unable to keep food down x 2 day. EMS called Pt became SOB while walking to EMS truck 02 was 90% on R/A .5mg  of  Albuterol neb given  pt tol well, sat 100% after neb treatment. Hr 110 family at bedside will monitor.

## 2011-01-20 NOTE — ED Provider Notes (Addendum)
History     CSN: [id number] Arrival date & time: 01/20/2011  5:55 AM   First MD Initiated Contact with Patient 01/20/11 [phone]      Chief Complaint  Patient presents with  . Shortness of Breath  . Diarrhea    (Consider location/radiation/quality/duration/timing/severity/associated sxs/prior treatment) HPI Comments: 77 year old female with a history of hyperlipidemia and hypertension who presents with 2 days of persistent watery diarrhea and lower abdominal pain, progressive shortness of breath with dyspnea on exertion and cough. She states that her symptoms came on gradually, gradually getting worse and not associated with swelling, rash, chest pain, back pain.  Patient is a 77 y.o. female presenting with shortness of breath and diarrhea. The history is provided by the patient and a relative.  Shortness of Breath  Associated symptoms include shortness of breath.  Diarrhea The primary symptoms include diarrhea.    Past Medical History  Diagnosis Date  . DIVERTICULOSIS, COLON   . VITAMIN D DEFICIENCY   . HYPERLIPIDEMIA   . HYPERTENSION   . OSTEOPOROSIS     Past Surgical History  Procedure Date  . Cystoscopy 07/2005  . Vertebroplasty 03/2005    Dr. Deanne Coffer    Family History  Problem Relation Age of Onset  . Heart disease Mother   . Hypertension Mother   . Heart disease Father   . Hypertension Father     History  Substance Use Topics  . Smoking status: Former Smoker -- 20 years  . Smokeless tobacco: Not on file   Comment: Married, lives with spouse. retired  . Alcohol Use: 1.2 oz/week    2 Glasses of wine per week    OB History    Grav Para Term Preterm Abortions TAB SAB Ect Mult Living                  Review of Systems  Respiratory: Positive for shortness of breath.   Gastrointestinal: Positive for diarrhea.  All other systems reviewed and are negative.    Allergies  Aspirin; Penicillins; and Shrimp  Home Medications   Current Outpatient Rx  Name  Route Sig Dispense Refill  . ALBUTEROL 90 MCG/ACT IN AERS Inhalation Inhale 2 puffs into the lungs every 6 (six) hours as needed for wheezing. 17 g 1  . VITAMIN D 1000 UNITS PO TABS Oral Take 1,000 Units by mouth daily.      Marland Kitchen LOSARTAN POTASSIUM 50 MG PO TABS  TAKE 1 TABLET BY MOUTH EVERY DAY 90 tablet 1  . NON FORMULARY  Adult Gummy multivitamin take 2 daily     . ALBUTEROL SULFATE HFA 108 (90 BASE) MCG/ACT IN AERS Inhalation Inhale 2 puffs into the lungs every 4 (four) hours as needed for wheezing or shortness of breath. 1 Inhaler 3  . DIPHENOXYLATE-ATROPINE 2.5-0.025 MG PO TABS Oral Take 1 tablet by mouth 4 (four) times daily as needed for diarrhea or loose stools. 30 tablet 0  . ONDANSETRON 4 MG PO TBDP Oral Take 1 tablet (4 mg total) by mouth every 8 (eight) hours as needed for nausea. 10 tablet 0    BP 126/64  Pulse 88  Temp(Src) 98.3 F (36.8 C) (Oral)  Resp 20  SpO2 96%  Physical Exam  Nursing note and vitals reviewed. Constitutional: She appears well-developed and well-nourished. No distress.  HENT:  Head: Normocephalic and atraumatic.  Mouth/Throat: Oropharynx is clear and moist. No oropharyngeal exudate.  Eyes: Conjunctivae and EOM are normal. Pupils are equal, round, and reactive to light. Right  eye exhibits no discharge. Left eye exhibits no discharge. No scleral icterus.  Neck: Normal range of motion. Neck supple. No JVD present. No thyromegaly present.  Cardiovascular: Normal rate, regular rhythm, normal heart sounds and intact distal pulses.  Exam reveals no gallop and no friction rub.   No murmur heard. Pulmonary/Chest: Effort normal and breath sounds normal. No respiratory distress. She has no wheezes. She has no rales.  Abdominal: Soft. Bowel sounds are normal. She exhibits no distension and no mass. There is tenderness ( Mild left lower quadrant tenderness).  Musculoskeletal: Normal range of motion. She exhibits no edema and no tenderness.  Lymphadenopathy:    She  has no cervical adenopathy.  Neurological: She is alert. Coordination normal.  Skin: Skin is warm and dry. No rash noted. No erythema.  Psychiatric: She has a normal mood and affect. Her behavior is normal.    ED Course  Procedures (including critical care time)  Labs Reviewed  COMPREHENSIVE METABOLIC PANEL - Abnormal; Notable for the following:    Glucose, Bld 110 (*)    Creatinine, Ser 0.46 (*)    All other components within normal limits  GLUCOSE, CAPILLARY - Abnormal; Notable for the following:    Glucose-Capillary 105 (*)    All other components within normal limits  CBC  DIFFERENTIAL  LIPASE, BLOOD  URINALYSIS, ROUTINE W REFLEX MICROSCOPIC  POCT CBG MONITORING   Dg Chest 2 View  01/20/2011  *RADIOLOGY REPORT*  Clinical Data: Shortness of breath, weakness, cough.  History of hypertension, smoking.  CHEST - 2 VIEW  Comparison: 11/17/2009  Findings: The lungs are hyperinflated.  There are perihilar bronchitic changes.  Heart size is normal.  No evidence for pulmonary edema.  There are no focal consolidations or pleural effusions.  Patient has had prior to level vertebral plasty.  Bones appear osteopenic.  Multiple old bilateral rib fractures.  IMPRESSION:  1.  COPD and bronchitic changes. 2. No focal pulmonary abnormality.  Original Report Authenticated By: Patterson Hammersmith, M.D.     1. Diarrhea   2. Dehydration       MDM  Patient has improved significantly after IV fluids and Lomotil, pulse has improved to 97, blood pressure remained normal, afebrile. Oxygen saturation 95% or better on room air, chest x-ray negative for infiltrates showing only chronic changes. Blood work without electrolyte abnormalities or renal dysfunction.  Urinalysis pending at this time.  Patient states that she would like to go home if possible and is willing to followup with her family Dr.  Verl Bangs of shift - care signed out to Dr. Preston Fleeting pending UA.   Urinalysis has come back unremarkable.  Patient will be discharged.    Vida Roller, MD 01/20/11 1610  Dione Booze, MD 01/20/11 [phone]

## 2011-01-20 NOTE — ED Notes (Signed)
Pt deneis need for w/c at d/c

## 2011-01-23 ENCOUNTER — Encounter: Payer: Self-pay | Admitting: Internal Medicine

## 2011-01-23 ENCOUNTER — Ambulatory Visit (INDEPENDENT_AMBULATORY_CARE_PROVIDER_SITE_OTHER): Payer: Medicare Other | Admitting: Internal Medicine

## 2011-01-23 VITALS — BP 132/90 | HR 86 | Temp 98.2°F | Wt 88.8 lb

## 2011-01-23 DIAGNOSIS — R197 Diarrhea, unspecified: Secondary | ICD-10-CM

## 2011-01-23 DIAGNOSIS — I10 Essential (primary) hypertension: Secondary | ICD-10-CM

## 2011-01-23 DIAGNOSIS — J209 Acute bronchitis, unspecified: Secondary | ICD-10-CM

## 2011-01-23 MED ORDER — AZITHROMYCIN 250 MG PO TABS: ORAL_TABLET | ORAL | Status: DC

## 2011-01-23 NOTE — Assessment & Plan Note (Signed)
?  overtx at home contributing to orthostatic symptoms -  Off hctz and reduced ARB dose 06/2010 - tPt monitors at home and takes 25-50mg  qd prn for blood pressure  BP Readings from Last 3 Encounters:  01/23/11 132/90  01/20/11 150/79  07/03/10 120/82

## 2011-01-23 NOTE — Progress Notes (Signed)
Subjective:    Patient ID: Bianca [name], female    DOB: 1930/04/25, 77 y.o.   MRN: [id number]  HPI   here for ER follow up - seen 12/16 for diarrhea and cough symptoms -  tx dehydration with IVF Home rx : zofran, lomotil and refill Alb MDI GI symptoms resolved, less weak - Still with chest congestion, but better with Alb MDI  also reviewed chronic med issues:   HTN - started ARB for same fall 2011, dose inc 03/2010 -reports compliance with ongoing medical treatment and no changes in medication dose or frequency. denies adverse side effects related to current therapy.   osteoporosis - hx compression fx, KP T10 03/2009- intol of prev tx efforts with actonel, boniva and fosamax due to GI SE - takes Calcium + vit d daily - WB exercise - walking as tol -   dyslipidemia - never rec rx med for same - controls with diet and exercise   Past Medical History  Diagnosis Date  . DIVERTICULOSIS, COLON   . VITAMIN D DEFICIENCY   . HYPERLIPIDEMIA   . HYPERTENSION   . OSTEOPOROSIS      Review of Systems  Respiratory: Positive for cough, shortness of breath and wheezing.   Cardiovascular: Negative for chest pain and leg swelling.  Musculoskeletal: Negative for joint swelling.  Neurological: Negative for tremors, seizures, syncope, light-headedness, numbness and headaches.       Objective:   Physical Exam  BP 132/90  Pulse 86  Temp(Src) 98.2 F (36.8 C) (Oral)  Wt 88 lb 12.8 oz (40.279 kg)  SpO2 90% Wt Readings from Last 3 Encounters:  01/23/11 88 lb 12.8 oz (40.279 kg)  07/03/10 91 lb 3.2 oz (41.368 kg)  04/09/10 92 lb 4 oz (41.844 kg)     Constitutional: She is petite; but appears well-developed and well-nourished. No distress.  Cardiovascular: Normal rate, regular rhythm and normal heart sounds.  No murmur. No BLE edema. Pulmonary/Chest: Effort normal  But few R side rhonchi - no wheeze or increase wob Neuro: AAOx4, CN 2-12 symmetrically intact, no nystagmus, balance and gait  normal Psychiatric: She has a normal mood and affect. Her behavior is normal. Judgment and thought content normal.       Lab Results  Component Value Date   WBC 7.0 01/20/2011   HGB 14.0 01/20/2011   HCT 42.4 01/20/2011   PLT 287 01/20/2011   CHOL 239 10/12/2009   HDL 117 10/12/2009   ALT 14 78/29/5621   AST 27 01/20/2011   NA 136 01/20/2011   K 3.6 01/20/2011   CL 98 01/20/2011   CREATININE 0.46* 01/20/2011   BUN 10 01/20/2011   CO2 27 01/20/2011   TSH 0.776 10/12/2009   INR 1.02 03/10/2009   Dg Chest 2 View  01/20/2011  *RADIOLOGY REPORT*  Clinical Data: Shortness of breath, weakness, cough.  History of hypertension, smoking.  CHEST - 2 VIEW  Comparison: 11/17/2009  Findings: The lungs are hyperinflated.  There are perihilar bronchitic changes.  Heart size is normal.  No evidence for pulmonary edema.  There are no focal consolidations or pleural effusions.  Patient has had prior to level vertebral plasty.  Bones appear osteopenic.  Multiple old bilateral rib fractures.  IMPRESSION:  1.  COPD and bronchitic changes. 2. No focal pulmonary abnormality.  Original Report Authenticated By: Patterson Hammersmith, M.D.   Assessment & Plan:   Acute bronchitis with cough - Zpak, continue Alb MDI and OTC robitussin - CXR 12/16  reviewed  GI illness with diarrhea and dehydration - resolved

## 2011-01-23 NOTE — Patient Instructions (Signed)
It was good to see you today. We have reviewed your prior records including labs and tests today Add Zpak antibiotics - Your prescription(s) have been submitted to your pharmacy. Please take as directed and contact our office if you believe you are having problem(s) with the medication(s). Continue Robitussin and Inhaler - also stay hydrated and rest

## 2011-01-28 ENCOUNTER — Telehealth: Payer: Self-pay

## 2011-01-28 NOTE — Telephone Encounter (Signed)
No additional antibiotics at this time - should take Mucinex DM 2x/day with lots of hydration x 1 week - also continue the Alb inhaler for cough/shortness of breath and lomotil as needed for diarrhea (as rx'd by ER and reviewed at OV last week) - if symptoms worse or unimproved after this tx for next 7-10d, please ROV for recheck

## 2011-01-28 NOTE — Telephone Encounter (Signed)
Patients husband called to inform the patient still has congestion, shortness of breath and some diarrhea. Please advise, call back number is [phone]

## 2011-01-28 NOTE — Telephone Encounter (Signed)
Patient informed of MD's instructions

## 2011-02-06 ENCOUNTER — Encounter: Payer: Self-pay | Admitting: Internal Medicine

## 2011-02-06 ENCOUNTER — Ambulatory Visit (INDEPENDENT_AMBULATORY_CARE_PROVIDER_SITE_OTHER): Payer: Medicare Other | Admitting: Internal Medicine

## 2011-02-06 VITALS — BP 130/90 | HR 90 | Temp 97.1°F | Wt 84.0 lb

## 2011-02-06 DIAGNOSIS — J441 Chronic obstructive pulmonary disease with (acute) exacerbation: Secondary | ICD-10-CM

## 2011-02-06 DIAGNOSIS — J449 Chronic obstructive pulmonary disease, unspecified: Secondary | ICD-10-CM | POA: Insufficient documentation

## 2011-02-06 MED ORDER — PREDNISONE (PAK) 10 MG PO TABS: 10.0000 mg | ORAL_TABLET | ORAL | Status: AC

## 2011-02-06 MED ORDER — TIOTROPIUM BROMIDE MONOHYDRATE 18 MCG IN CAPS: 18.0000 ug | ORAL_CAPSULE | Freq: Every day | RESPIRATORY_TRACT | Status: DC

## 2011-02-06 MED ORDER — DOXYCYCLINE HYCLATE 100 MG PO TABS: 100.0000 mg | ORAL_TABLET | Freq: Two times a day (BID) | ORAL | Status: AC

## 2011-02-06 NOTE — Patient Instructions (Addendum)
It was good to see you today. We have reviewed your prior records including chest xray, labs and tests from ER 12/16 visit today Add doxycycline antibiotics, prednisone taper and Spiriva inhaler  - Your prescription(s) have been submitted to your pharmacy. Please take as directed and contact our office if you believe you are having problem(s) with the medication(s). Continue Robitussin and rescue Albuterol Inhaler as needed - also stay hydrated and rest Please schedule followup in 3-4 weeks to recheck breathing/cough, call sooner if problems.

## 2011-02-06 NOTE — Progress Notes (Signed)
Subjective:    Patient ID: Bianca [name], female    DOB: 06/05/1930, 78 y.o.   MRN: [id number]  HPI   here for follow up - seen in ER 12/16 for diarrhea and cough symptoms -  tx dehydration with IVF Home rx : zofran, lomotil and refill Alb MDI GI symptoms resolved, less weak - Still with chest congestion, but better with Alb MDI 12/19 OV here> tx bronchitis with Zpak  Continued "congestion and cough", mild wheeze and dyspnea on exertion - no fever or leg swelling  also reviewed chronic med issues:   HTN - started ARB for same fall 2011, dose inc 03/2010 -reports compliance with ongoing medical treatment and no changes in medication dose or frequency. denies adverse side effects related to current therapy.   osteoporosis - hx compression fx, KP T10 03/2009- intol of prev tx efforts with actonel, boniva and fosamax due to GI SE - takes Calcium + vit d daily - WB exercise - walking as tol -   dyslipidemia - never rec rx med for same - controls with diet and exercise   Past Medical History  Diagnosis Date  . DIVERTICULOSIS, COLON   . VITAMIN D DEFICIENCY   . HYPERLIPIDEMIA   . HYPERTENSION   . OSTEOPOROSIS   . COPD (chronic obstructive pulmonary disease)      Review of Systems  Respiratory: Positive for cough, shortness of breath and wheezing.   Cardiovascular: Negative for chest pain and leg swelling.  Musculoskeletal: Negative for joint swelling.  Neurological: Negative for tremors, seizures, syncope, light-headedness, numbness and headaches.       Objective:   Physical Exam  BP 130/90  Pulse 90  Temp(Src) 97.1 F (36.2 C) (Oral)  Wt 84 lb (38.102 kg)  SpO2 91% Wt Readings from Last 3 Encounters:  02/06/11 84 lb (38.102 kg)  01/23/11 88 lb 12.8 oz (40.279 kg)  07/03/10 91 lb 3.2 oz (41.368 kg)     Constitutional: She is petite; but appears well-developed and well-nourished. No distress.  Cardiovascular: Normal rate, regular rhythm and normal heart sounds.  No  murmur. No BLE edema. Pulmonary/Chest: Effort normal  But few L base rhonchi - no wheeze or increased wob Neuro: AAOx4, CN 2-12 symmetrically intact, no nystagmus, balance and gait normal Psychiatric: She has a normal mood and affect. Her behavior is normal. Judgment and thought content normal.       Lab Results  Component Value Date   WBC 7.0 01/20/2011   HGB 14.0 01/20/2011   HCT 42.4 01/20/2011   PLT 287 01/20/2011   CHOL 239 10/12/2009   HDL 117 10/12/2009   ALT 14 40/98/1191   AST 27 01/20/2011   NA 136 01/20/2011   K 3.6 01/20/2011   CL 98 01/20/2011   CREATININE 0.46* 01/20/2011   BUN 10 01/20/2011   CO2 27 01/20/2011   TSH 0.776 10/12/2009   INR 1.02 03/10/2009   Dg Chest 2 View  01/20/2011  *RADIOLOGY REPORT*  Clinical Data: Shortness of breath, weakness, cough.  History of hypertension, smoking.  CHEST - 2 VIEW  Comparison: 11/17/2009  Findings: The lungs are hyperinflated.  There are perihilar bronchitic changes.  Heart size is normal.  No evidence for pulmonary edema.  There are no focal consolidations or pleural effusions.  Patient has had prior to level vertebral plasty.  Bones appear osteopenic.  Multiple old bilateral rib fractures.  IMPRESSION:  1.  COPD and bronchitic changes. 2. No focal pulmonary abnormality.  Original  Report Authenticated By: Patterson Hammersmith, M.D.   Assessment & Plan:  COPD - ongoing exac with bronchitis Repeat antibiotics - doxy x 7 d and tx pred taper Add Spiriva daily to rescue inhaler follow up 2-3 week, sooner if problems  CXR 12/16 reviewed  GI illness with diarrhea and dehydration - resolved

## 2011-02-27 ENCOUNTER — Telehealth: Payer: Self-pay | Admitting: *Deleted

## 2011-02-27 NOTE — Telephone Encounter (Signed)
Pt left on vm Dr. Charlett Blake is recommending that she get the bone growth injection. MD suppose to be send Dr. Felicity Coyer office notes recommending her to have pt set-up...02/27/11@11 :35am/LMB

## 2011-02-28 NOTE — Telephone Encounter (Signed)
Notified pt with md response...02/28/11@12 :39pm/LMB

## 2011-02-28 NOTE — Telephone Encounter (Signed)
i am uncertain what pt is referring to... Prolia? i will await and review Dr Eilleen Kempf note once available and call pt with information after review - Pt to check back in 2 weeks if she has not heard from [place] by that time - thanks

## 2011-04-15 ENCOUNTER — Other Ambulatory Visit: Payer: Self-pay | Admitting: Internal Medicine

## 2011-04-15 DIAGNOSIS — Z1231 Encounter for screening mammogram for malignant neoplasm of breast: Secondary | ICD-10-CM

## 2011-05-08 ENCOUNTER — Ambulatory Visit (HOSPITAL_COMMUNITY): Payer: Medicare Other

## 2011-05-22 ENCOUNTER — Ambulatory Visit (HOSPITAL_COMMUNITY)
Admission: RE | Admit: 2011-05-22 | Discharge: 2011-05-22 | Disposition: A | Discharge status: Home / Self Care | Payer: Medicare Other | Source: Ambulatory Visit | Attending: Internal Medicine | Admitting: Internal Medicine

## 2011-05-22 DIAGNOSIS — Z1231 Encounter for screening mammogram for malignant neoplasm of breast: Secondary | ICD-10-CM | POA: Insufficient documentation

## 2011-05-24 ENCOUNTER — Other Ambulatory Visit: Payer: Self-pay | Admitting: Internal Medicine

## 2011-05-24 DIAGNOSIS — R928 Other abnormal and inconclusive findings on diagnostic imaging of breast: Secondary | ICD-10-CM

## 2011-05-27 ENCOUNTER — Ambulatory Visit
Admission: RE | Admit: 2011-05-27 | Discharge: 2011-05-27 | Disposition: A | Discharge status: Home / Self Care | Payer: Medicare Other | Source: Ambulatory Visit | Attending: Internal Medicine | Admitting: Internal Medicine

## 2011-05-27 ENCOUNTER — Encounter: Payer: Self-pay | Admitting: Internal Medicine

## 2011-05-27 DIAGNOSIS — R928 Other abnormal and inconclusive findings on diagnostic imaging of breast: Secondary | ICD-10-CM

## 2011-05-27 LAB — HM MAMMOGRAPHY

## 2011-06-05 ENCOUNTER — Telehealth: Payer: Self-pay

## 2011-06-05 ENCOUNTER — Other Ambulatory Visit: Payer: Self-pay | Admitting: Internal Medicine

## 2011-06-05 MED ORDER — LOSARTAN POTASSIUM 50 MG PO TABS: 50.0000 mg | ORAL_TABLET | Freq: Every day | ORAL | Status: DC

## 2011-06-05 NOTE — Telephone Encounter (Signed)
Notified pt rx sent to [facility],,,, 06/05/11@10 :43am/LMB

## 2011-06-05 NOTE — Telephone Encounter (Signed)
Pt requesting losartan--pt says she is out and she said pharm called this office and have not heard from this office.  Pharm will not fill prescription until they hear from this office--[facility]--pt ph# [phone]

## 2011-06-05 NOTE — Telephone Encounter (Signed)
A user error has taken place: encounter opened in error, closed for administrative reasons.

## 2011-09-06 ENCOUNTER — Encounter: Payer: Self-pay | Admitting: Internal Medicine

## 2011-09-06 ENCOUNTER — Ambulatory Visit (INDEPENDENT_AMBULATORY_CARE_PROVIDER_SITE_OTHER): Payer: Medicare Other | Admitting: Internal Medicine

## 2011-09-06 VITALS — BP 120/70 | HR 92 | Temp 97.9°F | Ht <= 58 in | Wt 83.4 lb

## 2011-09-06 DIAGNOSIS — J209 Acute bronchitis, unspecified: Secondary | ICD-10-CM

## 2011-09-06 DIAGNOSIS — M810 Age-related osteoporosis without current pathological fracture: Secondary | ICD-10-CM

## 2011-09-06 DIAGNOSIS — I10 Essential (primary) hypertension: Secondary | ICD-10-CM

## 2011-09-06 DIAGNOSIS — J440 Chronic obstructive pulmonary disease with acute lower respiratory infection: Secondary | ICD-10-CM

## 2011-09-06 MED ORDER — DOXYCYCLINE HYCLATE 100 MG PO TABS: 100.0000 mg | ORAL_TABLET | Freq: Two times a day (BID) | ORAL | Status: AC

## 2011-09-06 MED ORDER — TRAMADOL HCL 50 MG PO TABS: 50.0000 mg | ORAL_TABLET | Freq: Four times a day (QID) | ORAL | Status: AC | PRN

## 2011-09-06 MED ORDER — PREDNISONE (PAK) 10 MG PO TABS: 10.0000 mg | ORAL_TABLET | ORAL | Status: AC

## 2011-09-06 MED ORDER — VITAMIN D 400 UNITS PO TABS: 1200.0000 [IU] | ORAL_TABLET | Freq: Every day | ORAL | Status: DC

## 2011-09-06 MED ORDER — LOSARTAN POTASSIUM 50 MG PO TABS: 25.0000 mg | ORAL_TABLET | Freq: Every day | ORAL | Status: DC

## 2011-09-06 NOTE — Assessment & Plan Note (Signed)
Severe dz - working with ortho and receiving Prolia through their office Continue Vit D and calcium - reviewed dosing nd med mgmt today No new bone pain

## 2011-09-06 NOTE — Patient Instructions (Signed)
It was good to see you today. We have reviewed your interval medical records including meds, labs and tests today Use doxycycline antibiotics 2x/day, prednisone taper x 6 days Your prescription(s) have been submitted to your pharmacy. Please take as directed and contact our office if you believe you are having problem(s) with the medication(s). Use the Spiriva inhaler daily to prevent flares-  Continue Robitussin and rescue Albuterol Inhaler as needed for shortness of breath - also stay hydrated and rest Other medications reviewed, updated at this time.  Please schedule followup in 6 months to recheck blood pressure, call sooner if problems.

## 2011-09-06 NOTE — Progress Notes (Signed)
  Subjective:    Patient ID: Bianca [name], female    DOB: [birth year], 78 y.o.   MRN: [id number]  HPI  here for "congestion and cough" associated with mild wheeze and dyspnea on exertion -  Also green-yellow sputum x 2 days no fever or leg swelling  also reviewed chronic med issues:   hypertension - started ARB for same fall 2011 -reports compliance with ongoing medical treatment and no changes in medication dose or frequency. denies adverse side effects related to current therapy.   osteoporosis - hx compression fx, KP T10 03/2009- intol of prev tx efforts with actonel, boniva and fosamax due to GI SE, started Prolia 04/2011 thru [facility]/ortho office- also takes Calcium + vit d daily - WB exercise - walking as tolerated by pain -   dyslipidemia - never rec rx med for same - controls with diet and exercise   Past Medical History  Diagnosis Date  . DIVERTICULOSIS, COLON   . VITAMIN D DEFICIENCY   . HYPERLIPIDEMIA   . HYPERTENSION   . OSTEOPOROSIS   . COPD (chronic obstructive pulmonary disease)      Review of Systems  Constitutional: Negative for fever and fatigue.  Respiratory: Positive for cough and wheezing.   Cardiovascular: Negative for chest pain and leg swelling.  Musculoskeletal: Negative for joint swelling.  Neurological: Negative for tremors, seizures, syncope, light-headedness and numbness.       Objective:   Physical Exam  BP 120/70  Pulse 92  Temp 97.9 F (36.6 C) (Oral)  Ht 4\' 10"  (1.473 m)  Wt 83 lb 6 oz (37.819 kg)  BMI 17.43 kg/m2  SpO2 95% Wt Readings from Last 3 Encounters:  09/06/11 83 lb 6 oz (37.819 kg)  02/06/11 84 lb (38.102 kg)  01/23/11 88 lb 12.8 oz (40.279 kg)     Constitutional: She is petite; but appears well-developed and well-nourished. No distress.  Cardiovascular: Normal rate, regular rhythm and normal heart sounds.  No murmur. No BLE edema. Pulmonary/Chest: Effort normal  But few scattered rhonchi - R side exp wheeze, but no  increased wob Psychiatric: She has a normal mood and affect. Her behavior is normal. Judgment and thought content normal.       Lab Results  Component Value Date   WBC 7.0 01/20/2011   HGB 14.0 01/20/2011   HCT 42.4 01/20/2011   PLT 287 01/20/2011   CHOL 239 10/12/2009   HDL 117 10/12/2009   ALT 14 40/98/1191   AST 27 01/20/2011   NA 136 01/20/2011   K 3.6 01/20/2011   CL 98 01/20/2011   CREATININE 0.46* 01/20/2011   BUN 10 01/20/2011   CO2 27 01/20/2011   TSH 0.776 10/12/2009   INR 1.02 03/10/2009     Assessment & Plan:  COPD - ongoing exacerbation with bronchitis treat antibiotics - doxy x 7 d and tx pred taper contnue Spiriva daily with prn rescue inhaler  CXR 01/20/11 reviewed

## 2011-09-06 NOTE — Assessment & Plan Note (Signed)
Off hctz and reduced ARB dose 06/2010 -  Pt monitors at home and takes 25-50mg  qd prn for blood pressure  The current medical regimen is effective;  continue present plan and medications.  BP Readings from Last 3 Encounters:  09/06/11 120/70  02/06/11 130/90  01/23/11 132/90

## 2011-09-30 ENCOUNTER — Ambulatory Visit (INDEPENDENT_AMBULATORY_CARE_PROVIDER_SITE_OTHER): Payer: Medicare Other | Admitting: Internal Medicine

## 2011-09-30 ENCOUNTER — Ambulatory Visit (INDEPENDENT_AMBULATORY_CARE_PROVIDER_SITE_OTHER)
Admission: RE | Admit: 2011-09-30 | Discharge: 2011-09-30 | Disposition: A | Discharge status: Home / Self Care | Payer: Medicare Other | Source: Ambulatory Visit | Attending: Internal Medicine | Admitting: Internal Medicine

## 2011-09-30 ENCOUNTER — Encounter: Payer: Self-pay | Admitting: Internal Medicine

## 2011-09-30 ENCOUNTER — Telehealth: Payer: Self-pay | Admitting: Internal Medicine

## 2011-09-30 VITALS — BP 122/90 | HR 95 | Temp 98.1°F | Ht <= 58 in | Wt 82.1 lb

## 2011-09-30 DIAGNOSIS — J441 Chronic obstructive pulmonary disease with (acute) exacerbation: Secondary | ICD-10-CM

## 2011-09-30 MED ORDER — METHYLPREDNISOLONE 4 MG PO KIT: PACK | ORAL | Status: AC

## 2011-09-30 MED ORDER — LEVOFLOXACIN 500 MG PO TABS: 500.0000 mg | ORAL_TABLET | Freq: Every day | ORAL | Status: AC

## 2011-09-30 MED ORDER — METHYLPREDNISOLONE ACETATE 80 MG/ML IJ SUSP
120.0000 mg | Freq: Once | INTRAMUSCULAR | Status: AC
  Administered 2011-09-30: 120 mg via INTRAMUSCULAR

## 2011-09-30 MED ORDER — GUAIFENESIN ER 600 MG PO TB12: 1200.0000 mg | ORAL_TABLET | Freq: Two times a day (BID) | ORAL | Status: DC

## 2011-09-30 NOTE — Progress Notes (Signed)
Subjective:    Patient ID: Bianca [name], female    DOB: July 26, 1930, 78 y.o.   MRN: [id number]  Cough This is a recurrent problem. The current episode started 1 to 4 weeks ago. The problem has been waxing and waning. The problem occurs every few minutes. The cough is productive of sputum. Associated symptoms include shortness of breath and wheezing. Pertinent negatives include no chest pain, chills, fever, headaches, heartburn, hemoptysis, nasal congestion, postnasal drip, rhinorrhea, sore throat or sweats. The symptoms are aggravated by lying down. Risk factors for lung disease include smoking/tobacco exposure. She has tried a beta-agonist inhaler, ipratropium inhaler and OTC cough suppressant for the symptoms. The treatment provided mild relief. Her past medical history is significant for bronchitis and COPD. There is no history of environmental allergies.   also reviewed chronic medical issues:   hypertension - started ARB for same fall 2011 -reports compliance with ongoing medical treatment and no changes in medication dose or frequency. denies adverse side effects related to current therapy.   osteoporosis - hx compression fx, KP T10 03/2009- intol of prev tx efforts with actonel, boniva and fosamax due to GI SE, started Prolia 04/2011 thru sport med/ortho office- also takes Calcium + vit d daily - WB exercise - walking as tolerated by pain -   dyslipidemia - never rec rx med for same due to excellent HDL>100 - controls with diet and exercise   Past Medical History  Diagnosis Date  . DIVERTICULOSIS, COLON   . VITAMIN D DEFICIENCY   . HYPERLIPIDEMIA   . HYPERTENSION   . OSTEOPOROSIS   . COPD (chronic obstructive pulmonary disease)      Review of Systems  Constitutional: Negative for fever, chills and fatigue.  HENT: Negative for sore throat, rhinorrhea and postnasal drip.   Respiratory: Positive for cough, shortness of breath and wheezing. Negative for hemoptysis.   Cardiovascular:  Negative for chest pain and leg swelling.  Gastrointestinal: Negative for heartburn.  Musculoskeletal: Negative for joint swelling.  Neurological: Negative for tremors, seizures, syncope, light-headedness, numbness and headaches.  Hematological: Negative for environmental allergies.       Objective:   Physical Exam  BP 122/90  Pulse 95  Temp 98.1 F (36.7 C) (Oral)  Ht 4\' 10"  (1.473 m)  Wt 82 lb 1.9 oz (37.249 kg)  BMI 17.16 kg/m2  SpO2 89% Wt Readings from Last 3 Encounters:  09/30/11 82 lb 1.9 oz (37.249 kg)  09/06/11 83 lb 6 oz (37.819 kg)  02/06/11 84 lb (38.102 kg)     Constitutional: She is petite; but appears well-developed and well-nourished. No distress.  Cardiovascular: Normal rate, regular rhythm and normal heart sounds.  No murmur. No BLE edema. Pulmonary/Chest: Effort normal at rest and conversation, but deep congested cough with bilateral rhonchi - mild exp wheeze, but no increased work of breathing at rest Psychiatric: She has a normal mood and affect. Her behavior is normal. Judgment and thought content normal.       Lab Results  Component Value Date   WBC 7.0 01/20/2011   HGB 14.0 01/20/2011   HCT 42.4 01/20/2011   PLT 287 01/20/2011   CHOL 239 10/12/2009   HDL 117 10/12/2009   ALT 14 82/95/6213   AST 27 01/20/2011   NA 136 01/20/2011   K 3.6 01/20/2011   CL 98 01/20/2011   CREATININE 0.46* 01/20/2011   BUN 10 01/20/2011   CO2 27 01/20/2011   TSH 0.776 10/12/2009   INR 1.02 03/10/2009  Assessment & Plan:  COPD - ongoing exacerbation with bronchitis s/p doxy and pred taper 2 weeks ago - temporary improved Check CXR now tx Levaquin and steroid taper + IM medrol now contnue Spiriva daily with prn rescue inhaler  CXR 01/20/11 reviewed

## 2011-09-30 NOTE — Patient Instructions (Signed)
It was good to see you today. Medrol shot given today in office Chest xray ordered today. Your results will be called to you after review (48-72hours after test completion). If any changes need to be made, you will be notified at that time. Levaquin antibiotics daily x 10 days and Medrol taper in place of prior prednisone Your prescription(s) have been submitted to your pharmacy. Please take as directed and contact our office if you believe you are having problem(s) with the medication(s). Continue to use the Spiriva inhaler daily to prevent flares- no samples in office today but can call as needed to check on same Continue the rescue Albuterol Inhaler as needed for shortness of breath -  Mucinex 2x/day x 10 days (over the counter) for mucus production Other medications reviewed, updated at this time.  Please schedule followup in 6 months to recheck blood pressure, call sooner if problems.

## 2011-09-30 NOTE — Telephone Encounter (Signed)
Caller: Maurice/Patient; Patient Name: Vita Erm; PCP: [name] (Adults only); Best Callback Phone Number: [phone] Seen in office first week of August for deep hacking cough and was prescribed Prednisone and Doxycycline. Symptoms had improved for one week  and now cough is back and is as bad as it was when first seen. Takes Nyquil at [facility] and Spriva Inhaler once daily which helps. Afebrile. Cough Non productive, feels short of breath and chest hurts after coughing. Triage and Care advice per Cough Protocol and appnt advised within 4 hours for "...worsening cough AND asthma with increasing frequency of flair-ups since last scheduled appnt". Appnt scheduled for 09/30/11 @ 1445 with Dr. Felicity Coyer.

## 2011-10-25 ENCOUNTER — Telehealth: Payer: Self-pay | Admitting: Internal Medicine

## 2011-10-25 ENCOUNTER — Encounter: Payer: Self-pay | Admitting: Internal Medicine

## 2011-10-25 ENCOUNTER — Ambulatory Visit (INDEPENDENT_AMBULATORY_CARE_PROVIDER_SITE_OTHER): Payer: Medicare Other | Admitting: Internal Medicine

## 2011-10-25 VITALS — BP 110/82 | HR 99 | Temp 100.6°F | Ht 59.0 in | Wt 79.5 lb

## 2011-10-25 DIAGNOSIS — J209 Acute bronchitis, unspecified: Secondary | ICD-10-CM | POA: Insufficient documentation

## 2011-10-25 DIAGNOSIS — J441 Chronic obstructive pulmonary disease with (acute) exacerbation: Secondary | ICD-10-CM

## 2011-10-25 DIAGNOSIS — I10 Essential (primary) hypertension: Secondary | ICD-10-CM

## 2011-10-25 MED ORDER — LEVOFLOXACIN 500 MG PO TABS: 500.0000 mg | ORAL_TABLET | Freq: Every day | ORAL | Status: DC

## 2011-10-25 MED ORDER — METHYLPREDNISOLONE ACETATE 80 MG/ML IJ SUSP: 120.0000 mg | Freq: Once | INTRAMUSCULAR | Status: DC

## 2011-10-25 MED ORDER — METHYLPREDNISOLONE ACETATE 80 MG/ML IJ SUSP
120.0000 mg | Freq: Once | INTRAMUSCULAR | Status: AC
  Administered 2011-10-25: 120 mg via INTRAMUSCULAR

## 2011-10-25 MED ORDER — HYDROCODONE-HOMATROPINE 5-1.5 MG/5ML PO SYRP: 5.0000 mL | ORAL_SOLUTION | Freq: Four times a day (QID) | ORAL | Status: DC | PRN

## 2011-10-25 MED ORDER — PREDNISONE 10 MG PO TABS: ORAL_TABLET | ORAL | Status: DC

## 2011-10-25 NOTE — Progress Notes (Signed)
Subjective:    Patient ID: Bianca [name], female    DOB: 1930-12-30, 78 y.o.   MRN: [id number]  HPI  Here with acute onset mild to mod 4-5 days ST, HA, general weakness and malaise, with prod cough greenish sputum, but Pt denies chest pain, increased sob or doe, wheezing, orthopnea, PND, increased LE swelling, palpitations, dizziness or syncope, but with onset midl to mod sob/wheezing x 3 days as well.  No chills,   Denies urinary symptoms such as dysuria, frequency, urgency,or hematuria.  3rd episode similar this yr, this episode seems to be the worst. Pt denies new neurological symptoms such as new headache, or facial or extremity weakness or numbness   Pt denies polydipsia, polyuria Past Medical History  Diagnosis Date  . DIVERTICULOSIS, COLON   . VITAMIN D DEFICIENCY   . HYPERLIPIDEMIA   . HYPERTENSION   . OSTEOPOROSIS   . COPD (chronic obstructive pulmonary disease)    Past Surgical History  Procedure Date  . Cystoscopy 07/2005  . Vertebroplasty 03/2005    Dr. Deanne Coffer    reports that she has quit smoking. She does not have any smokeless tobacco history on file. She reports that she drinks about 1.2 ounces of alcohol per week. She reports that she does not use illicit drugs. family history includes Heart disease in her father and mother and Hypertension in her father and mother. Allergies  Allergen Reactions  . Aspirin   . Penicillins   . Shrimp (Shellfish Allergy)    Current Outpatient Prescriptions on File Prior to Visit  Medication Sig Dispense Refill  . albuterol (PROVENTIL HFA;VENTOLIN HFA) 108 (90 BASE) MCG/ACT inhaler Inhale 2 puffs into the lungs every 4 (four) hours as needed for wheezing or shortness of breath.  1 Inhaler  3  . cholecalciferol 400 UNITS tablet Take 3 tablets (1,200 Units total) by mouth daily.      Marland Kitchen denosumab (PROLIA) 60 MG/ML SOLN injection Inject 60 mg into the skin every 6 (six) months. Administer in upper arm, thigh, or abdomen. Dr Hoy Register office   1 mL    . guaiFENesin (MUCINEX) 600 MG 12 hr tablet Take 2 tablets (1,200 mg total) by mouth 2 (two) times daily.  40 tablet  0  . losartan (COZAAR) 50 MG tablet Take 0.5 tablets (25 mg total) by mouth daily.  30 tablet  5  . NON FORMULARY Adult Gummy multivitamin take 2 daily       . tiotropium (SPIRIVA HANDIHALER) 18 MCG inhalation capsule Place 1 capsule (18 mcg total) into inhaler and inhale daily.  30 capsule  12   No current facility-administered medications on file prior to visit.   Review of Systems  Constitutional: Negative for wt loss  HENT: Negative for tinnitus.   Eyes: Negative for photophobia and visual disturbance.  Respiratory: Negative for choking and stridor.   Gastrointestinal: Negative for vomiting and blood in stool.  Genitourinary: Negative for hematuria and decreased urine volume.  Musculoskeletal: Negative for gait problem.  Skin: Negative for color change and wound.  Neurological: Negative for tremors and numbness.     Objective:   Physical Exam BP 110/82  Pulse 99  Temp 100.6 F (38.1 C) (Oral)  Ht 4\' 11"  (1.499 m)  Wt 79 lb 8 oz (36.061 kg)  BMI 16.06 kg/m2  SpO2 91% Physical Exam  VS noted, mild ill Constitutional: Pt appears well-developed and well-nourished.  HENT: Head: Normocephalic.  Right Ear: External ear normal.  Left Ear: External ear normal.  Bilat tm's mild erythema.  Sinus nontender.  Pharynx mild erythema Eyes: Conjunctivae and EOM are normal. Pupils are equal, round, and reactive to light.  Neck: Normal range of motion. Neck supple.  Cardiovascular: Normal rate and regular rhythm.   Pulmonary/Chest: Effort normal and breath sounds mild decreased bilat with mild wheeze bilat Neurological: Pt is alert. Not confused  Skin: Skin is warm. No erythema.  Psychiatric: Pt behavior is normal. Thought content normal.     Assessment & Plan:

## 2011-10-25 NOTE — Assessment & Plan Note (Signed)
stable overall by hx and exam, most recent data reviewed with pt, and pt to continue medical treatment as before SpO2 Readings from Last 3 Encounters:  10/25/11 91%  09/30/11 89%  09/06/11 95%

## 2011-10-25 NOTE — Assessment & Plan Note (Addendum)
Mild to mod, for depomedrol IM, predpack asd,  to f/u any worsening symptoms or concerns, consider add steroid inhaler given 3rd episode this yr, to f/u with PCP

## 2011-10-25 NOTE — Telephone Encounter (Signed)
Called the patient and she agreed to see Dr. Jonny Ruiz at 4:30 today per MD request. Transferred to scheduler.

## 2011-10-25 NOTE — Patient Instructions (Addendum)
You had the steroid shot today Take all new medications as prescribed - the antibiotic, cough medicine, and prednisone Continue all other medications as before

## 2011-10-25 NOTE — Telephone Encounter (Signed)
Please advise in Dr Leschber's absence, thanks! 

## 2011-10-25 NOTE — Telephone Encounter (Signed)
EMERGENT CALL:THE PATIENT REFUSED 911: Caller: [name]/Patient; Patient Name: Bianca [name]; PCP: [name] (Adults only); Best Callback Phone Number: [phone]; Reason for call: Cough/Congestion; symptoms started approximately sometime this month in September; pt was seen in the office and treated with Prednisone around 10/06/11; symptoms worsened on 10/20/11; was sleeping in the chair due to the coughing; up all night with cough last night; used Spiriva and helped some; completed 2nd round of Prednisone and Doxicycline; denies shortness of breath at this time; Triaged per Cough-Adult Guideline; See in ED immediately due to new or worsening cough and know respiratory condition not responding to treatment; pt prefers office visit over ED; no appointments available;  OFFICE PLEASE REVIEW AND CALL PT BACK REGARDING APPOINTMENT  FOR A SEE IN ED DISPOSTION; pt aware that she will be receiving a call back shortly and if symptoms worsen prior to call please call back; will comply

## 2011-10-25 NOTE — Assessment & Plan Note (Signed)
Mild to mod, for antibx course,  to f/u any worsening symptoms or concerns, and cough med 

## 2011-10-25 NOTE — Telephone Encounter (Signed)
Ok for 430 today if ok with pt, or to ER or UC

## 2011-12-02 ENCOUNTER — Ambulatory Visit (INDEPENDENT_AMBULATORY_CARE_PROVIDER_SITE_OTHER): Payer: Medicare Other | Admitting: Internal Medicine

## 2011-12-02 ENCOUNTER — Encounter: Payer: Self-pay | Admitting: Internal Medicine

## 2011-12-02 VITALS — BP 128/88 | HR 98 | Temp 97.7°F

## 2011-12-02 DIAGNOSIS — J449 Chronic obstructive pulmonary disease, unspecified: Secondary | ICD-10-CM

## 2011-12-02 DIAGNOSIS — M810 Age-related osteoporosis without current pathological fracture: Secondary | ICD-10-CM

## 2011-12-02 DIAGNOSIS — J441 Chronic obstructive pulmonary disease with (acute) exacerbation: Secondary | ICD-10-CM

## 2011-12-02 DIAGNOSIS — R05 Cough: Secondary | ICD-10-CM

## 2011-12-02 MED ORDER — OMEPRAZOLE 20 MG PO CPDR: 20.0000 mg | DELAYED_RELEASE_CAPSULE | Freq: Every day | ORAL | Status: DC

## 2011-12-02 MED ORDER — BENZONATATE 200 MG PO CAPS
200.0000 mg | ORAL_CAPSULE | Freq: Three times a day (TID) | ORAL | Status: DC | PRN
Start: 2011-12-02 — End: 2012-01-11

## 2011-12-02 MED ORDER — LEVOFLOXACIN 500 MG PO TABS: 500.0000 mg | ORAL_TABLET | Freq: Every day | ORAL | Status: DC

## 2011-12-02 NOTE — Patient Instructions (Addendum)
It was good to see you today. We have reviewed your prior records including labs and tests today Levaquin antibiotics daily x 10days Also use Tessalon 3 times a day and Prilosec once daily for the next 30 days to help suppress cough Your prescription(s) have been submitted to your pharmacy. Please take as directed and contact our office if you believe you are having problem(s) with the medication(s). we'll make referral to pulmonary specialist to help [place] with the treatment of your lung disease and cough. Our office will contact you regarding appointment(s) once made. Please schedule followup in 3-4 months, call sooner if problems.

## 2011-12-02 NOTE — Progress Notes (Signed)
  Subjective:    Patient ID: Bianca [name], female    DOB: 06/25/1930, 78 y.o.   MRN: [id number]  HPI  complains of cough Ongoing greater than 6 weeks Associated with chest congestion but little sputum Has been seen here for same and treated for acute exacerbation of COPD and bronchitis Denies improvement while on prednisone Compliant with inhalers albuterol and Spiriva as prescribed Denies chest pain or ankle swelling  Past Medical History  Diagnosis Date  . DIVERTICULOSIS, COLON   . VITAMIN D DEFICIENCY   . HYPERLIPIDEMIA   . HYPERTENSION   . OSTEOPOROSIS   . COPD (chronic obstructive pulmonary disease)      Review of Systems  Constitutional: Positive for activity change and fatigue. Negative for fever.  Respiratory: Positive for cough and shortness of breath. Negative for choking, chest tightness and wheezing.   Cardiovascular: Negative for chest pain, palpitations and leg swelling.       Objective:   Physical Exam  BP 128/88  Pulse 98  Temp 97.7 F (36.5 C) (Oral)  SpO2 91% Wt Readings from Last 3 Encounters:  10/25/11 79 lb 8 oz (36.061 kg)  09/30/11 82 lb 1.9 oz (37.249 kg)  09/06/11 83 lb 6 oz (37.819 kg)   General: Frail, elderly white woman. Deep chest congestion with cough intermittent during history and exam Lungs: Decreased breath sounds bilateral bases, scattered rhonchi without wheeze -no increased work of breathing at rest Cardiovascular: Regular rate and rhythm, no edema bilateral ankles Musculoskeletal: Kyphotic, petite. No gross peripheral deformities  Lab Results  Component Value Date   WBC 7.0 01/20/2011   HGB 14.0 01/20/2011   HCT 42.4 01/20/2011   PLT 287 01/20/2011   GLUCOSE 110* 01/20/2011   CHOL 239 10/12/2009   HDL 117 10/12/2009   LDLCALC 104 10/12/2009   ALT 14 01/20/2011   AST 27 01/20/2011   NA 136 01/20/2011   K 3.6 01/20/2011   CL 98 01/20/2011   CREATININE 0.46* 01/20/2011   BUN 10 01/20/2011   CO2 27 01/20/2011   TSH 0.776  10/12/2009   INR 1.02 03/10/2009   Dg Chest 2 View  09/30/2011  *RADIOLOGY REPORT*  Clinical Data: Chronic obstructive lung disease.  Chest congestion.  CHEST - 2 VIEW  Comparison: 01/20/2011  Findings: Heart size and pulmonary vascularity are normal.  Lungs are hyperinflated with chronic emphysematous changes.  No acute infiltrates or effusions.  No acute osseous abnormality.  Diffuse osteopenia with multiple old compression fractures treated with vertebroplasty.  IMPRESSION: No acute abnormality.  Extensive emphysematous changes.   Original Report Authenticated By: Gwynn Burly, M.D.      Assessment & Plan:   Cough Advanced COPD with exacerbation Osteoporosis, on prolia  Reviewed prior eval and tx -denies improvement on steroid course x3 in past 6 weeks On Spiriva with prn Alb MDI Add alternate cough suppression with tessalon and PPI daily times next 30 days Also retreat 10d Levaquin for ?subacute infection given advanced COPD Refer to pulm for additional assistance with med mgmt

## 2011-12-05 ENCOUNTER — Encounter: Payer: Self-pay | Admitting: Internal Medicine

## 2011-12-05 ENCOUNTER — Ambulatory Visit (INDEPENDENT_AMBULATORY_CARE_PROVIDER_SITE_OTHER): Payer: Medicare Other | Admitting: Internal Medicine

## 2011-12-05 VITALS — BP 138/86 | HR 113 | Temp 98.0°F | Ht 60.0 in | Wt 77.2 lb

## 2011-12-05 DIAGNOSIS — J449 Chronic obstructive pulmonary disease, unspecified: Secondary | ICD-10-CM

## 2011-12-05 MED ORDER — MOMETASONE FURO-FORMOTEROL FUM 100-5 MCG/ACT IN AERO: INHALATION_SPRAY | RESPIRATORY_TRACT | Status: DC

## 2011-12-05 MED ORDER — FAMOTIDINE 20 MG PO TABS: ORAL_TABLET | ORAL | Status: DC

## 2011-12-05 NOTE — Progress Notes (Signed)
  Subjective:    Patient ID: Bianca [name], female    DOB: 18-Jul-1930   MRN: [id number]  HPI  60 yowf remote smoker quit 1983 with new onset cough  referred by Bayard Hugger 12/05/2011 to pulmonary clinic.   12/05/2011 1st pulmonary eval cc persistent cough x 8 months indolent onset mostly dry only better p prednisone for about a month after finished course, then  Some better p use albuterol vs not much change with spiriva.  Variably productive, mucoid sputum not more than a tbsp or two esp in ams. No obvious daytime variabilty or assoc  cp or chest tightness, subjective wheeze overt sinus or hb symptoms. No unusual exp hx - does note doe x > slow adls assoc with cough, also some better p saba.   Sleeping ok without nocturnal  or early am exacerbation  of respiratory  c/o's or need for noct saba. Also denies any obvious fluctuation of symptoms with weather or environmental changes or other aggravating or alleviating factors except as outlined above   Review of Systems  Constitutional: Negative for fever, chills and unexpected weight change.  HENT: Negative for ear pain, nosebleeds, congestion, sore throat, rhinorrhea, sneezing, trouble swallowing, dental problem, voice change, postnasal drip and sinus pressure.   Eyes: Negative for visual disturbance.  Respiratory: Positive for cough and shortness of breath. Negative for choking.   Cardiovascular: Negative for chest pain and leg swelling.  Gastrointestinal: Negative for vomiting, abdominal pain and diarrhea.  Genitourinary: Negative for difficulty urinating.  Musculoskeletal: Negative for arthralgias.  Skin: Negative for rash.  Neurological: Negative for tremors, syncope and headaches.  Hematological: Does not bruise/bleed easily.       Objective:   Physical Exam   Wt Readings from Last 3 Encounters:  12/05/11 77 lb 3.2 oz (35.018 kg)  10/25/11 79 lb 8 oz (36.061 kg)  09/30/11 82 lb 1.9 oz (37.249 kg)   HEENT mild turbinate edema.   Oropharynx no thrush or excess pnd or cobblestoning.  No JVD or cervical adenopathy. Mild accessory muscle hypertrophy. Trachea midline, nl thryroid. Chest was hyperinflated by percussion with diminished breath sounds and moderate increased exp time without wheeze. Hoover sign positive at mid inspiration. Regular rate and rhythm without murmur gallop or rub or increase P2 or edema.  Abd: no hsm, nl excursion. Ext warm without cyanosis or clubbing.   cxr 09/30/11 No acute abnormality. Extensive emphysematous changes      Assessment & Plan:

## 2011-12-05 NOTE — Patient Instructions (Addendum)
Pepcid 20 mg one at bedtime  Omeprazole (prilosec) 20mg   Take 30-60 min before first meal of the day   Dulera 100 Take 2 puffs first thing in am and then another 2 puffs about 12 hours later.   Only use your albuterol (ventolin)as a rescue medication to be used if you can't catch your breath by resting or doing a relaxed purse lip breathing pattern. The less you use it, the better it will work when you need it.   Work on inhaler technique:  relax and gently blow all the way out then take a nice smooth deep breath back in, triggering the inhaler at same time you start breathing in.  Hold for up to 5 seconds if you can.  Rinse and gargle with water when done   If your mouth or throat starts to bother you,   I suggest you time the inhaler to your dental care and after using the inhaler(s) brush teeth and tongue with a baking soda containing toothpaste and when you rinse this out, gargle with it first to see if this helps your mouth and throat.     Please schedule a follow up office visit in 2 weeks, sooner if needed

## 2011-12-06 NOTE — Assessment & Plan Note (Signed)
Clinically moderate with very significant ab/ streroid responsive component not better on lama so rec  The proper method of use, as well as anticipated side effects, of a metered-dose inhaler are discussed and demonstrated to the patient. Improved effectiveness after extensive coaching during this visit to a level of approximately  50% > trial of dulera 100 (the mildere strenght since cough so much of an issue)  ? gerd related tendency to aecop (not typical purulent sputum hx) > trial of gerd rx and diet.Marland Kitchen

## 2011-12-17 ENCOUNTER — Ambulatory Visit (INDEPENDENT_AMBULATORY_CARE_PROVIDER_SITE_OTHER): Payer: Medicare Other | Admitting: Internal Medicine

## 2011-12-17 ENCOUNTER — Encounter: Payer: Self-pay | Admitting: Internal Medicine

## 2011-12-17 VITALS — BP 110/78 | HR 112 | Temp 98.0°F | Ht 60.0 in | Wt 76.0 lb

## 2011-12-17 DIAGNOSIS — J449 Chronic obstructive pulmonary disease, unspecified: Secondary | ICD-10-CM

## 2011-12-17 DIAGNOSIS — J9610 Chronic respiratory failure, unspecified whether with hypoxia or hypercapnia: Secondary | ICD-10-CM

## 2011-12-17 NOTE — Progress Notes (Signed)
  Subjective:    Patient ID: Bianca [name], female    DOB: [birth year]   MRN: [id number]  HPI  78 yowf remote smoker quit 1983 with new onset cough  referred by Bayard Hugger 12/05/2011 to pulmonary clinic.   12/05/2011 1st pulmonary eval cc persistent cough x 8 months indolent onset mostly dry only better p prednisone for about a month after finished course, then  Some better p use albuterol vs not much change with spiriva.  Variably productive, mucoid sputum not more than a tbsp or two esp in ams.  rec Pepcid 20 mg one at bedtime Omeprazole (prilosec) 20mg   Take 30-60 min before first meal of the day  Dulera 100 Take 2 puffs first thing in am and then another 2 puffs about 12 hours later.  Only use your albuterol (ventolin)as a rescue medication to be used if you can't catch your breath Work on inhaler technique:      12/17/2011 f/u ov/Infantof Villagomez stopped spiriva and not needing rescue much vs before started dulera despite very poor hfa (see a/p) .  No obvious daytime variabilty or assoc chronic cough or cp or chest tightness, subjective wheeze overt sinus or hb symptoms. No unusual exp hx     Sleeping ok without nocturnal  or early am exacerbation  of respiratory  c/o's or need for noct saba. Also denies any obvious fluctuation of symptoms with weather or environmental changes or other aggravating or alleviating factors except as outlined above.  ROS  The following are not active complaints unless bolded sore throat, dysphagia, dental problems, itching, sneezing,  nasal congestion or excess/ purulent secretions, ear ache,   fever, chills, sweats, unintended wt loss, pleuritic or exertional cp, hemoptysis,  orthopnea pnd or leg swelling, presyncope, palpitations, heartburn, abdominal pain, anorexia, nausea, vomiting, diarrhea  or change in bowel or urinary habits, change in stools or urine, dysuria,hematuria,  rash, arthralgias, visual complaints, headache, numbness weakness or ataxia or problems with  walking or coordination,  change in mood/affect or memory.           Objective:   Physical Exam  Wt 12/17/2011  78 Wt Readings from Last 3 Encounters:  12/05/11 77 lb 3.2 oz (35.018 kg)  10/25/11 79 lb 8 oz (36.061 kg)  09/30/11 82 lb 1.9 oz (37.249 kg)   HEENT mild turbinate edema.  Oropharynx no thrush or excess pnd or cobblestoning.  No JVD or cervical adenopathy. Mild accessory muscle hypertrophy. Trachea midline, nl thryroid. Chest was hyperinflated by percussion with diminished breath sounds and moderate increased exp time without wheeze. Hoover sign positive at mid inspiration. Regular rate and rhythm without murmur gallop or rub or increase P2 or edema.  Abd: no hsm, nl excursion. Ext warm without cyanosis or clubbing.   cxr 09/30/11 No acute abnormality. Extensive emphysematous changes      Assessment & Plan:

## 2011-12-17 NOTE — Assessment & Plan Note (Signed)
-   sats 80% on arrival to clinic 12/17/2011   She qualifies for amb 02 but is not enthusiastic about using it > will try to optimize rx then bring back for pft's and walking sats next ov

## 2011-12-17 NOTE — Patient Instructions (Addendum)
Add back spiriva each am  Continue dulera Take 2 puffs first thing in am and then another 2 puffs about 12 hours later.   Only use your albuterol (ventolin) as a rescue medication to be used if you can't catch your breath by resting or doing a relaxed purse lip breathing pattern. The less you use it, the better it will work when you need it.   Please schedule a follow up office visit in 6 weeks, call sooner if needed with pft's on return.

## 2011-12-17 NOTE — Assessment & Plan Note (Signed)
-   hfa 50% 12/06/2011 > 50% 12/17/2011   She is symptomatically improved despite lack of efficacy with hfa and did much better with dpi so will add back spiriva then return for pft's.    Each maintenance medication was reviewed in detail including most importantly the difference between maintenance and as needed and under what circumstances the prns are to be used.  Please see instructions for details which were reviewed in writing and the patient given a copy.    The proper method of use, as well as anticipated side effects, of a metered-dose inhaler are discussed and demonstrated to the patient. Improved effectiveness after extensive coaching during this visit to a level of approximately  50% at most, no better

## 2012-01-06 ENCOUNTER — Emergency Department (HOSPITAL_COMMUNITY): Payer: Medicare Other

## 2012-01-06 ENCOUNTER — Emergency Department (HOSPITAL_COMMUNITY)
Admission: EM | Admit: 2012-01-06 | Discharge: 2012-01-06 | Disposition: A | Discharge status: Home / Self Care | Payer: Medicare Other | Attending: Emergency Medicine | Admitting: Emergency Medicine

## 2012-01-06 ENCOUNTER — Encounter (HOSPITAL_COMMUNITY): Payer: Self-pay | Admitting: Emergency Medicine

## 2012-01-06 DIAGNOSIS — Z79899 Other long term (current) drug therapy: Secondary | ICD-10-CM | POA: Insufficient documentation

## 2012-01-06 DIAGNOSIS — E785 Hyperlipidemia, unspecified: Secondary | ICD-10-CM | POA: Insufficient documentation

## 2012-01-06 DIAGNOSIS — M62838 Other muscle spasm: Secondary | ICD-10-CM

## 2012-01-06 DIAGNOSIS — Z8739 Personal history of other diseases of the musculoskeletal system and connective tissue: Secondary | ICD-10-CM | POA: Insufficient documentation

## 2012-01-06 DIAGNOSIS — I10 Essential (primary) hypertension: Secondary | ICD-10-CM | POA: Insufficient documentation

## 2012-01-06 DIAGNOSIS — J4489 Other specified chronic obstructive pulmonary disease: Secondary | ICD-10-CM | POA: Insufficient documentation

## 2012-01-06 DIAGNOSIS — IMO0002 Reserved for concepts with insufficient information to code with codable children: Secondary | ICD-10-CM | POA: Insufficient documentation

## 2012-01-06 DIAGNOSIS — Z8719 Personal history of other diseases of the digestive system: Secondary | ICD-10-CM | POA: Insufficient documentation

## 2012-01-06 DIAGNOSIS — E559 Vitamin D deficiency, unspecified: Secondary | ICD-10-CM | POA: Insufficient documentation

## 2012-01-06 DIAGNOSIS — J189 Pneumonia, unspecified organism: Secondary | ICD-10-CM | POA: Insufficient documentation

## 2012-01-06 DIAGNOSIS — Z87891 Personal history of nicotine dependence: Secondary | ICD-10-CM | POA: Insufficient documentation

## 2012-01-06 DIAGNOSIS — J449 Chronic obstructive pulmonary disease, unspecified: Secondary | ICD-10-CM

## 2012-01-06 LAB — CBC WITH DIFFERENTIAL/PLATELET
Eosinophils Absolute: 0 10*3/uL (ref 0.0–0.7)
Hemoglobin: 12.8 g/dL (ref 12.0–15.0)
Lymphocytes Relative: 3 % — ABNORMAL LOW (ref 12–46)
Lymphs Abs: 0.5 10*3/uL — ABNORMAL LOW (ref 0.7–4.0)
MCH: 32.2 pg (ref 26.0–34.0)
MCV: 95.7 fL (ref 78.0–100.0)
Monocytes Relative: 10 % (ref 3–12)
Neutrophils Relative %: 87 % — ABNORMAL HIGH (ref 43–77)
RBC: 3.98 MIL/uL (ref 3.87–5.11)
WBC: 17.6 10*3/uL — ABNORMAL HIGH (ref 4.0–10.5)

## 2012-01-06 LAB — BASIC METABOLIC PANEL
CO2: 25 mEq/L (ref 19–32)
GFR calc non Af Amer: >90 mL/min (ref >90)
Glucose, Bld: 99 mg/dL (ref 70–99)
Potassium: 3.8 mEq/L (ref 3.5–5.1)
Sodium: 131 mEq/L — ABNORMAL LOW (ref 135–145)

## 2012-01-06 MED ORDER — IPRATROPIUM BROMIDE 0.02 % IN SOLN
0.5000 mg | Freq: Once | RESPIRATORY_TRACT | Status: AC
  Administered 2012-01-06: 0.5 mg via RESPIRATORY_TRACT
  Filled 2012-01-06: qty 2.5

## 2012-01-06 MED ORDER — ALBUTEROL SULFATE (5 MG/ML) 0.5% IN NEBU
2.5000 mg | INHALATION_SOLUTION | Freq: Once | RESPIRATORY_TRACT | Status: AC
  Administered 2012-01-06: 2.5 mg via RESPIRATORY_TRACT
  Filled 2012-01-06: qty 0.5

## 2012-01-06 MED ORDER — LEVOFLOXACIN 750 MG PO TABS: 750.0000 mg | ORAL_TABLET | Freq: Every day | ORAL | Status: AC

## 2012-01-06 MED ORDER — MIDAZOLAM HCL 2 MG/ML PO SYRP
2.0000 mg | ORAL_SOLUTION | Freq: Once | ORAL | Status: AC
  Administered 2012-01-06: 2 mg via ORAL
  Filled 2012-01-06: qty 1
  Filled 2012-01-06: qty 2

## 2012-01-06 MED ORDER — METHOCARBAMOL 500 MG PO TABS
500.0000 mg | ORAL_TABLET | Freq: Once | ORAL | Status: AC
  Administered 2012-01-06: 500 mg via ORAL
  Filled 2012-01-06: qty 1

## 2012-01-06 MED ORDER — LEVOFLOXACIN 500 MG PO TABS
750.0000 mg | ORAL_TABLET | Freq: Every day | ORAL | Status: DC
  Administered 2012-01-06: 750 mg via ORAL
  Filled 2012-01-06: qty 2

## 2012-01-06 MED ORDER — METHOCARBAMOL 500 MG PO TABS: 500.0000 mg | ORAL_TABLET | Freq: Two times a day (BID) | ORAL | Status: DC

## 2012-01-06 MED ORDER — IBUPROFEN 600 MG PO TABS: 600.0000 mg | ORAL_TABLET | Freq: Four times a day (QID) | ORAL | Status: DC | PRN

## 2012-01-06 NOTE — ED Notes (Signed)
Per EMS, pt has a "crick" in her neck on the left side that radiates to the shoulder causing significant pain. Started 3 days ago. Pt also been treated recently x 3 for bronchitis.

## 2012-01-06 NOTE — ED Notes (Signed)
AVW:[id number]<XB> Expected date:01/06/12<BR> Expected time: 6:29 AM<BR> Means of arrival:Ambulance<BR> Comments:<BR> 72 Female left shoulder pain

## 2012-01-06 NOTE — ED Notes (Addendum)
Pt states that she has had bronchitis for many weeks. She has had 3 different types of abx therapy and is still rhonchus with dyspnea upon exertion. Pt also has pain in her left neck that runs to her shoulder with limited ROM. C/o nausea/diarrhea but denies vomiting. Pt denies chest pain.

## 2012-01-06 NOTE — ED Provider Notes (Addendum)
History     CSN: [id number]  Arrival date & time 01/06/12  4098   First MD Initiated Contact with Patient 01/06/12 [phone]      Chief Complaint  Patient presents with  . Torticollis    (Consider location/radiation/quality/duration/timing/severity/associated sxs/prior treatment) HPI Comments: Pt comes in with cc of neck pain. Pt reports waking up on Friday with pain in the neck, and that the pain has progressively gotten worse. The pain is primarily located on the left side of her neck, and it radiates up to her occiput and down to her scapula, and is worse with any kind of movement of the neck. The pain is mostly constant. There is no associated n/v/f/c/visual complains/weakness/numbness. She has no hx of similar complains in the past. Denies any sore throat.  Pt has hx of COPD, sat in the 80s at baseline, and is noted to be saturating in the 80s in the ED. Denies any sob, chest pain, has baseline cough. Deneis any hx pf PE, DVT. Has a pulmonologist following.  The history is provided by the patient, medical records and the spouse.    Past Medical History  Diagnosis Date  . DIVERTICULOSIS, COLON   . VITAMIN D DEFICIENCY   . HYPERLIPIDEMIA   . HYPERTENSION   . OSTEOPOROSIS   . COPD (chronic obstructive pulmonary disease)     Past Surgical History  Procedure Date  . Cystoscopy 07/2005  . Vertebroplasty 03/2005    Dr. Deanne Coffer    Family History  Problem Relation Age of Onset  . Heart disease Mother   . Hypertension Mother   . Heart disease Father   . Hypertension Father     History  Substance Use Topics  . Smoking status: Former Smoker -- 0.3 packs/day for 31 years    Types: Cigarettes    Start date: 02/04/1950    Quit date: 02/04/1981  . Smokeless tobacco: Never Used     Comment: Married, lives with spouse. retired  . Alcohol Use: 1.2 oz/week    2 Glasses of wine per week    OB History    Grav Para Term Preterm Abortions TAB SAB Ect Mult Living                   Review of Systems  Constitutional: Positive for activity change.  HENT: Positive for neck pain and neck stiffness.   Respiratory: Negative for shortness of breath.   Cardiovascular: Negative for chest pain.  Gastrointestinal: Negative for nausea, vomiting and abdominal pain.  Genitourinary: Negative for dysuria.  Neurological: Negative for dizziness, syncope, facial asymmetry, speech difficulty, weakness, numbness and headaches.    Allergies  Aspirin; Codeine; Penicillins; and Shrimp  Home Medications   Current Outpatient Rx  Name  Route  Sig  Dispense  Refill  . ALBUTEROL SULFATE HFA 108 (90 BASE) MCG/ACT IN AERS   Inhalation   Inhale 2 puffs into the lungs every 4 (four) hours as needed for wheezing or shortness of breath.   1 Inhaler   3   . BENZONATATE 200 MG PO CAPS   Oral   Take 1 capsule (200 mg total) by mouth 3 (three) times daily as needed for cough.   30 capsule   1   . VITAMIN D 400 UNITS PO TABS   Oral   Take 3 tablets (1,200 Units total) by mouth daily.         Marland Kitchen FAMOTIDINE 20 MG PO TABS      One at bedtime  30 tablet   11   . LOSARTAN POTASSIUM 50 MG PO TABS   Oral   Take 0.5 tablets (25 mg total) by mouth daily.   30 tablet   5   . MOMETASONE FURO-FORMOTEROL FUM 100-5 MCG/ACT IN AERO      Take 2 puffs first thing in am and then another 2 puffs about 12 hours later.   1 Inhaler   0   . NON FORMULARY      Adult Gummy multivitamin take 2 daily          . TIOTROPIUM BROMIDE MONOHYDRATE 18 MCG IN CAPS   Inhalation   Place 18 mcg into inhaler and inhale daily.         . DENOSUMAB 60 MG/ML [employer] SOLN   Subcutaneous   Inject 60 mg into the skin every 6 (six) months. Administer in upper arm, thigh, or abdomen. Dr Hoy Register office   1 mL        BP 127/81  Pulse 115  Temp 98.8 F (37.1 C) (Axillary)  Resp 17  Ht 5' (1.524 m)  Wt 75 lb (34.02 kg)  BMI 14.65 kg/m2  SpO2 92%  Physical Exam  Nursing note and vitals  reviewed. Constitutional: She is oriented to person, place, and time. She appears well-developed and well-nourished.  HENT:  Head: Normocephalic and atraumatic.       No bruits  Eyes: EOM are normal. Pupils are equal, round, and reactive to light.  Neck: Neck supple.  Cardiovascular: Normal rate, regular rhythm and normal heart sounds.   No murmur heard. Pulmonary/Chest: Effort normal. No respiratory distress.  Abdominal: Soft. She exhibits no distension. There is no tenderness. There is no rebound and no guarding.  Musculoskeletal:       No spasms appreciated in the neck, however, mild tenderness to palpation of the left paraspinal muscles, and reproducible tenderness with movement of the neck.  Neurological: She is alert and oriented to person, place, and time. No cranial nerve deficit. Coordination normal.       Equal and firm grip strength with normal sensation in the upper extremities bilaterally  Skin: Skin is warm and dry.    ED Course  Procedures (including critical care time)  Labs Reviewed - No data to display No results found.   No diagnosis found.    MDM  Pt comes in with cc of neck pain. DDx included m-s pain - cervical strain, torticollis and vascular etiologies including dissection.  The neuro exam is completely normal - and so vascular problem is less likely, but i truly dont appreciate neck spasms either. The pain is reproducible with movement, favoring the M-S pain.  Will give versed, and reassess.  Hypoxia, it is due to COPD, baseline.   Derwood Kaplan, MD 01/06/12 0750  CXR does show possible infection. Patient has no fevers, cough is not new, and her O2 sats are in the upper 80s and low 90s as before. She doesn't think she needs to stay, and i feel if we have close followup, she weill do well in outpatient setting. Called Dr. Renato Shin clinic and patient will be seen this week for sure. Will send her home with levoquine. Neck pain feels a lot  better. She denied any trauma to me, but has subacute rib fratures - so i am getting ct cpine now,   Derwood Kaplan, MD 01/06/12 0900

## 2012-01-06 NOTE — ED Notes (Signed)
Pt refused IV  

## 2012-01-11 ENCOUNTER — Emergency Department (HOSPITAL_COMMUNITY): Payer: Medicare Other

## 2012-01-11 ENCOUNTER — Inpatient Hospital Stay (HOSPITAL_COMMUNITY)
Admission: EM | Admit: 2012-01-11 | Discharge: 2012-01-17 | DRG: 193 | Disposition: A | Discharge status: Home Health | Payer: Medicare Other | Attending: Internal Medicine | Admitting: Internal Medicine

## 2012-01-11 ENCOUNTER — Encounter (HOSPITAL_COMMUNITY): Payer: Self-pay | Admitting: *Deleted

## 2012-01-11 DIAGNOSIS — E871 Hypo-osmolality and hyponatremia: Secondary | ICD-10-CM | POA: Diagnosis present

## 2012-01-11 DIAGNOSIS — I10 Essential (primary) hypertension: Secondary | ICD-10-CM

## 2012-01-11 DIAGNOSIS — J9620 Acute and chronic respiratory failure, unspecified whether with hypoxia or hypercapnia: Secondary | ICD-10-CM | POA: Diagnosis present

## 2012-01-11 DIAGNOSIS — R0781 Pleurodynia: Secondary | ICD-10-CM

## 2012-01-11 DIAGNOSIS — J9610 Chronic respiratory failure, unspecified whether with hypoxia or hypercapnia: Secondary | ICD-10-CM

## 2012-01-11 DIAGNOSIS — I5021 Acute systolic (congestive) heart failure: Secondary | ICD-10-CM | POA: Diagnosis present

## 2012-01-11 DIAGNOSIS — E876 Hypokalemia: Secondary | ICD-10-CM | POA: Diagnosis present

## 2012-01-11 DIAGNOSIS — J189 Pneumonia, unspecified organism: Principal | ICD-10-CM

## 2012-01-11 DIAGNOSIS — J449 Chronic obstructive pulmonary disease, unspecified: Secondary | ICD-10-CM

## 2012-01-11 DIAGNOSIS — A0472 Enterocolitis due to Clostridium difficile, not specified as recurrent: Secondary | ICD-10-CM

## 2012-01-11 DIAGNOSIS — D649 Anemia, unspecified: Secondary | ICD-10-CM | POA: Diagnosis present

## 2012-01-11 DIAGNOSIS — I509 Heart failure, unspecified: Secondary | ICD-10-CM | POA: Diagnosis present

## 2012-01-11 DIAGNOSIS — J441 Chronic obstructive pulmonary disease with (acute) exacerbation: Secondary | ICD-10-CM

## 2012-01-11 DIAGNOSIS — J209 Acute bronchitis, unspecified: Secondary | ICD-10-CM

## 2012-01-11 DIAGNOSIS — I2699 Other pulmonary embolism without acute cor pulmonale: Secondary | ICD-10-CM

## 2012-01-11 DIAGNOSIS — R197 Diarrhea, unspecified: Secondary | ICD-10-CM

## 2012-01-11 DIAGNOSIS — R0602 Shortness of breath: Secondary | ICD-10-CM

## 2012-01-11 DIAGNOSIS — R7989 Other specified abnormal findings of blood chemistry: Secondary | ICD-10-CM

## 2012-01-11 LAB — COMPREHENSIVE METABOLIC PANEL
ALT: 17 U/L (ref 0–35)
BUN: 7 mg/dL (ref 6–23)
CO2: 29 mEq/L (ref 19–32)
Calcium: 9 mg/dL (ref 8.4–10.5)
Creatinine, Ser: 0.46 mg/dL — ABNORMAL LOW (ref 0.50–1.10)
GFR calc Af Amer: >90 mL/min (ref >90)
GFR calc non Af Amer: >90 mL/min (ref >90)
Glucose, Bld: 102 mg/dL — ABNORMAL HIGH (ref 70–99)
Total Protein: 6.9 g/dL (ref 6.0–8.3)

## 2012-01-11 LAB — CBC WITH DIFFERENTIAL/PLATELET
Eosinophils Absolute: 0.2 10*3/uL (ref 0.0–0.7)
Eosinophils Relative: 1 % (ref 0–5)
HCT: 40 % (ref 36.0–46.0)
Hemoglobin: 13.3 g/dL (ref 12.0–15.0)
Lymphocytes Relative: 6 % — ABNORMAL LOW (ref 12–46)
Lymphs Abs: 1.2 10*3/uL (ref 0.7–4.0)
MCH: 31 pg (ref 26.0–34.0)
MCV: 93.2 fL (ref 78.0–100.0)
Monocytes Absolute: 2.1 10*3/uL — ABNORMAL HIGH (ref 0.1–1.0)
Monocytes Relative: 11 % (ref 3–12)
Platelets: 513 10*3/uL — ABNORMAL HIGH (ref 150–400)
RBC: 4.29 MIL/uL (ref 3.87–5.11)
WBC: 19.2 10*3/uL — ABNORMAL HIGH (ref 4.0–10.5)

## 2012-01-11 LAB — URINE MICROSCOPIC-ADD ON

## 2012-01-11 LAB — POCT I-STAT TROPONIN I: Troponin i, poc: 0.09 ng/mL (ref 0.00–0.08)

## 2012-01-11 LAB — URINALYSIS, ROUTINE W REFLEX MICROSCOPIC
Bilirubin Urine: NEGATIVE
Nitrite: NEGATIVE
Specific Gravity, Urine: 1.019 (ref 1.005–1.030)
Urobilinogen, UA: 0.2 mg/dL (ref 0.0–1.0)
pH: 5.5 (ref 5.0–8.0)

## 2012-01-11 LAB — LACTIC ACID, PLASMA: Lactic Acid, Venous: 1.5 mmol/L (ref 0.5–2.2)

## 2012-01-11 MED ORDER — VANCOMYCIN HCL IN DEXTROSE 1-5 GM/200ML-% IV SOLN
1000.0000 mg | Freq: Once | INTRAVENOUS | Status: AC
  Administered 2012-01-12: 1000 mg via INTRAVENOUS
  Filled 2012-01-11: qty 200

## 2012-01-11 MED ORDER — ACETAMINOPHEN 325 MG PO TABS: 650.0000 mg | ORAL_TABLET | Freq: Four times a day (QID) | ORAL | Status: DC | PRN

## 2012-01-11 MED ORDER — SODIUM CHLORIDE 0.9 % IV BOLUS (SEPSIS)
500.0000 mL | Freq: Once | INTRAVENOUS | Status: AC
  Administered 2012-01-11: 500 mL via INTRAVENOUS

## 2012-01-11 MED ORDER — ONDANSETRON HCL 4 MG PO TABS: 4.0000 mg | ORAL_TABLET | Freq: Four times a day (QID) | ORAL | Status: DC | PRN

## 2012-01-11 MED ORDER — SODIUM CHLORIDE 0.9 % IV SOLN: INTRAVENOUS | Status: AC

## 2012-01-11 MED ORDER — ALUM & MAG HYDROXIDE-SIMETH 200-200-20 MG/5ML PO SUSP: 30.0000 mL | Freq: Four times a day (QID) | ORAL | Status: DC | PRN

## 2012-01-11 MED ORDER — ALBUTEROL SULFATE (5 MG/ML) 0.5% IN NEBU: 2.5000 mg | INHALATION_SOLUTION | RESPIRATORY_TRACT | Status: DC | PRN

## 2012-01-11 MED ORDER — ACETAMINOPHEN 650 MG RE SUPP: 650.0000 mg | Freq: Four times a day (QID) | RECTAL | Status: DC | PRN

## 2012-01-11 MED ORDER — SODIUM CHLORIDE 0.9 % IV SOLN
1.0000 g | Freq: Three times a day (TID) | INTRAVENOUS | Status: DC
  Administered 2012-01-11: 1 g via INTRAVENOUS
  Filled 2012-01-11 (×2): qty 1

## 2012-01-11 MED ORDER — SODIUM CHLORIDE 0.9 % IV SOLN
500.0000 mg | Freq: Three times a day (TID) | INTRAVENOUS | Status: DC
  Administered 2012-01-12: 500 mg via INTRAVENOUS
  Filled 2012-01-11 (×2): qty 0.5

## 2012-01-11 MED ORDER — HEPARIN (PORCINE) IN NACL 100-0.45 UNIT/ML-% IJ SOLN
700.0000 [IU]/h | INTRAMUSCULAR | Status: DC
  Administered 2012-01-11: 550 [IU]/h via INTRAVENOUS
  Filled 2012-01-11: qty 250

## 2012-01-11 MED ORDER — SODIUM CHLORIDE 0.9 % IV SOLN: INTRAVENOUS | Status: DC

## 2012-01-11 MED ORDER — HYDROMORPHONE HCL PF 1 MG/ML IJ SOLN
0.5000 mg | INTRAMUSCULAR | Status: DC | PRN
  Administered 2012-01-12 (×3): 1 mg via INTRAVENOUS
  Filled 2012-01-11 (×2): qty 1

## 2012-01-11 MED ORDER — ENOXAPARIN SODIUM 30 MG/0.3ML ~~LOC~~ SOLN
30.0000 mg | Freq: Every day | SUBCUTANEOUS | Status: DC
  Filled 2012-01-11: qty 0.3

## 2012-01-11 MED ORDER — HEPARIN BOLUS VIA INFUSION
1000.0000 [IU] | Freq: Once | INTRAVENOUS | Status: AC
  Administered 2012-01-11: 1000 [IU] via INTRAVENOUS

## 2012-01-11 MED ORDER — ONDANSETRON HCL 4 MG/2ML IJ SOLN: 4.0000 mg | Freq: Four times a day (QID) | INTRAMUSCULAR | Status: DC | PRN

## 2012-01-11 MED ORDER — ZOLPIDEM TARTRATE 5 MG PO TABS: 5.0000 mg | ORAL_TABLET | Freq: Every evening | ORAL | Status: DC | PRN

## 2012-01-11 MED ORDER — ALBUTEROL SULFATE (5 MG/ML) 0.5% IN NEBU
2.5000 mg | INHALATION_SOLUTION | Freq: Four times a day (QID) | RESPIRATORY_TRACT | Status: DC
  Filled 2012-01-11: qty 0.5

## 2012-01-11 NOTE — ED Notes (Signed)
MD notified about troponin.

## 2012-01-11 NOTE — ED Notes (Signed)
[id number]<QM> Expected date:<BR> Expected time:<BR> Means of arrival:<BR> Comments:<BR> Hold for tri 3

## 2012-01-11 NOTE — ED Notes (Signed)
Pt from home with c/o coughing, congestion and shob. Diagnosed with pneumonia Monday- sts she has had no relief from abx. O2 st 70 on room air on arrival to [facility].

## 2012-01-11 NOTE — ED Provider Notes (Signed)
History     CSN: [id number]  Arrival date & time 01/11/12  1931   First MD Initiated Contact with Patient 01/11/12 2029      Chief Complaint  Patient presents with  . Congestion     (Consider location/radiation/quality/duration/timing/severity/associated sxs/prior treatment) HPI Pt seen on 12/2 and diagnosed with torticollis and PNA. Prescribed abx and pain meds. Pt states she has almost finished Levaquin Rx and is having worse SOB, cough, L pleuritic chest pain. States her neck is felling better. No stiffness. No lower ext swelling or pain. No focal weakness, no HA.  Past Medical History  Diagnosis Date  . DIVERTICULOSIS, COLON   . VITAMIN D DEFICIENCY   . HYPERLIPIDEMIA   . HYPERTENSION   . OSTEOPOROSIS   . COPD (chronic obstructive pulmonary disease)     Past Surgical History  Procedure Date  . Cystoscopy 07/2005  . Vertebroplasty 03/2005    Dr. Deanne Coffer    Family History  Problem Relation Age of Onset  . Heart disease Mother   . Hypertension Mother   . Heart disease Father   . Hypertension Father     History  Substance Use Topics  . Smoking status: Former Smoker -- 0.3 packs/day for 31 years    Types: Cigarettes    Start date: 02/04/1950    Quit date: 02/04/1981  . Smokeless tobacco: Never Used     Comment: Married, lives with spouse. retired  . Alcohol Use: 1.2 oz/week    2 Glasses of wine per week    OB History    Grav Para Term Preterm Abortions TAB SAB Ect Mult Living                  Review of Systems  Constitutional: Positive for appetite change and fatigue. Negative for fever and chills.  HENT: Positive for neck pain. Negative for neck stiffness.   Respiratory: Positive for cough, shortness of breath and wheezing.   Cardiovascular: Positive for chest pain. Negative for palpitations and leg swelling.  Gastrointestinal: Negative for nausea, vomiting and abdominal pain.  Genitourinary: Negative for dysuria and frequency.  Musculoskeletal:  Negative for myalgias and back pain.  Skin: Negative for pallor, rash and wound.  Neurological: Negative for dizziness, weakness, light-headedness, numbness and headaches.    Allergies  Aspirin; Codeine; Penicillins; and Shrimp  Home Medications   No current outpatient prescriptions on file.  BP 114/68  Pulse 94  Temp 98.4 F (36.9 C) (Oral)  Resp 22  Ht 5' (1.524 m)  Wt 76 lb 15.1 oz (34.9 kg)  BMI 15.03 kg/m2  SpO2 94%  Physical Exam  Nursing note and vitals reviewed. Constitutional: She is oriented to person, place, and time. She appears well-developed and well-nourished. No distress.  HENT:  Head: Normocephalic and atraumatic.  Mouth/Throat: Oropharynx is clear and moist.  Eyes: EOM are normal. Pupils are equal, round, and reactive to light.  Neck: Normal range of motion. Neck supple.       No meningismus, Mild midline focal pain at C7 with extension.   Cardiovascular: Regular rhythm.        tachycardia  Pulmonary/Chest: Effort normal. No respiratory distress. She has no wheezes. She has rales (scattered rhonchi). She exhibits no tenderness.  Abdominal: Soft. Bowel sounds are normal. She exhibits no distension and no mass. There is no tenderness. There is no rebound and no guarding.  Musculoskeletal: Normal range of motion. She exhibits no edema and no tenderness.       No  calf swelling or pain  Neurological: She is alert and oriented to person, place, and time.       5/5 motor in all ext, sensation intact  Skin: Skin is warm and dry. No rash noted. No erythema.  Psychiatric: She has a normal mood and affect. Her behavior is normal.    ED Course  Procedures (including critical care time)  Labs Reviewed  CBC WITH DIFFERENTIAL - Abnormal; Notable for the following:    WBC 19.2 (*)     Platelets 513 (*)     Neutrophils Relative 82 (*)     Neutro Abs 15.7 (*)     Lymphocytes Relative 6 (*)     Monocytes Absolute 2.1 (*)     All other components within normal  limits  COMPREHENSIVE METABOLIC PANEL - Abnormal; Notable for the following:    Sodium 134 (*)     Potassium 3.3 (*)     Chloride 93 (*)     Glucose, Bld 102 (*)     Creatinine, Ser 0.46 (*)     Albumin 2.7 (*)     All other components within normal limits  URINALYSIS, ROUTINE W REFLEX MICROSCOPIC - Abnormal; Notable for the following:    APPearance CLOUDY (*)     Leukocytes, UA SMALL (*)     All other components within normal limits  D-DIMER, QUANTITATIVE - Abnormal; Notable for the following:    D-Dimer, Quant 1.46 (*)     All other components within normal limits  POCT I-STAT TROPONIN I - Abnormal; Notable for the following:    Troponin i, poc 0.09 (*)     All other components within normal limits  URINE MICROSCOPIC-ADD ON - Abnormal; Notable for the following:    Squamous Epithelial / LPF FEW (*)     All other components within normal limits  BASIC METABOLIC PANEL - Abnormal; Notable for the following:    Sodium 134 (*)     Potassium 3.2 (*)     BUN 4 (*)     Creatinine, Ser 0.37 (*)     Calcium 7.8 (*)     All other components within normal limits  CBC - Abnormal; Notable for the following:    WBC 17.2 (*)     RBC 3.82 (*)     HCT 35.7 (*)     Platelets 469 (*)     All other components within normal limits  APTT - Abnormal; Notable for the following:    aPTT 60 (*)     All other components within normal limits  HEPARIN LEVEL (UNFRACTIONATED) - Abnormal; Notable for the following:    Heparin Unfractionated 0.10 (*)     All other components within normal limits  CBC - Abnormal; Notable for the following:    WBC 16.0 (*)     RBC 3.83 (*)     Hemoglobin 11.9 (*)     HCT 35.8 (*)     Platelets 480 (*)     All other components within normal limits  TROPONIN I - Abnormal; Notable for the following:    Troponin I 1.41 (*)     All other components within normal limits  TROPONIN I - Abnormal; Notable for the following:    Troponin I 0.63 (*)     All other components  within normal limits  TROPONIN I - Abnormal; Notable for the following:    Troponin I 0.40 (*)     All other components within normal limits  CK TOTAL AND CKMB - Abnormal; Notable for the following:    CK, MB 5.9 (*)     All other components within normal limits  CK TOTAL AND CKMB - Abnormal; Notable for the following:    CK, MB 5.7 (*)     All other components within normal limits  CK TOTAL AND CKMB - Abnormal; Notable for the following:    CK, MB 5.3 (*)     All other components within normal limits  LACTIC ACID, PLASMA  PROTIME-INR  CULTURE, BLOOD (ROUTINE X 2)  CULTURE, BLOOD (ROUTINE X 2)  STREP PNEUMONIAE URINARY ANTIGEN  LEGIONELLA ANTIGEN, URINE  CULTURE, EXPECTORATED SPUTUM-ASSESSMENT  CLOSTRIDIUM DIFFICILE BY PCR  CLOSTRIDIUM DIFFICILE BY PCR  HEPARIN LEVEL (UNFRACTIONATED)   Dg Chest 2 View  01/11/2012  *RADIOLOGY REPORT*  Clinical Data: Cough, shortness of breath and chest congestion.  CHEST - 2 VIEW  Comparison: 01/06/2012.  Findings: Normal sized heart.  Multifocal patchy airspace opacity in both lungs with improvement in the lower lung zones and mild progression in the mid lung zones.  Minimal bilateral pleural fluid with improvement.  Stable lower thoracic vertebral compression deformities and kyphoplasty material.  Stable diffuse peribronchial thickening, accentuation of the interstitial markings and hyperexpansion of the lungs.  Old bilateral rib fractures.  IMPRESSION:  1.  Mild overall improvement in bilateral pneumonia. 2.  Stable changes of COPD and chronic bronchitis.   Original Report Authenticated By: Beckie Salts, M.D.    Nm Pulmonary Perf And Vent  01/12/2012  *RADIOLOGY REPORT*  Clinical Data:  79 year old female with chest pain, shortness of breath and elevated D-dimer.  NUCLEAR MEDICINE VENTILATION - PERFUSION LUNG SCAN  Technique:  Ventilation images were obtained in multiple projections using inhaled aerosol technetium 99 M DTPA.  Perfusion images were  obtained in multiple projections after intravenous injection of Tc-94m MAA.  Radiopharmaceuticals:  Tc-61m DTPA aerosol and 5 mCi Tc-23m MAA.  Comparison: 01/11/2012 chest radiograph  Findings:  Ventilation:  This is a suboptimal ventilation study with clumping of agent within the lungs.  Multiple scattered areas of decreased ventilation are noted, some which may be artifactual.  Perfusion:  Multiple scattered bilateral perfusion defects are identified, primarily in the mid and upper lungs.  Many of the perfusion defects appear to match ventilation defects.  IMPRESSION: Indeterminate probability for pulmonary embolus (20-80%).   Original Report Authenticated By: Harmon Pier, M.D.      1. Community acquired pneumonia   2. Pleuritic chest pain   3. Healthcare-associated pneumonia   4. COPD exacerbation   5. COPD (chronic obstructive pulmonary disease)   6. SOB (shortness of breath)   7. Chronic respiratory failure   8. Elevated troponin   9. Diarrhea      Date: 01/11/2012  Rate: 101  Rhythm: normal sinus rhythm  QRS Axis: normal  Intervals: normal  ST/T Wave abnormalities: normal  Conduction Disutrbances:none  Narrative Interpretation:   Old EKG Reviewed: Questionable new q wave in V2    MDM   [facility] to admit       Loren Racer, MD 01/12/12 1536

## 2012-01-11 NOTE — Progress Notes (Signed)
ANTICOAGULATION CONSULT NOTE - Initial Consult  Pharmacy Consult for Heparin Indication: R/O Pulmonary Embolism  Allergies  Allergen Reactions  . Aspirin Nausea And Vomiting  . Codeine Hives  . Penicillins Swelling  . Shrimp (Shellfish Allergy) Swelling    Patient Measurements: Height: 5' (152.4 cm) Weight: 76 lb (34.473 kg) IBW/kg (Calculated) : 45.5    Vital Signs: Temp: 98.8 F (37.1 C) (12/07 1956) Temp src: Oral (12/07 1956) BP: 144/84 mmHg (12/07 1956) Pulse Rate: 104  (12/07 1956)  Labs:  Basename 01/11/12 2030  HGB 13.3  HCT 40.0  PLT 513*  APTT --  LABPROT --  INR --  HEPARINUNFRC --  CREATININE 0.46*  CKTOTAL --  CKMB --  TROPONINI --    Estimated Creatinine Clearance: 31.1 ml/min (by C-G formula based on Cr of 0.46).   Medical History: Past Medical History  Diagnosis Date  . DIVERTICULOSIS, COLON   . VITAMIN D DEFICIENCY   . HYPERLIPIDEMIA   . HYPERTENSION   . OSTEOPOROSIS   . COPD (chronic obstructive pulmonary disease)     Medications:  Scheduled:    . albuterol  2.5 mg Nebulization Q6H  . enoxaparin (LOVENOX) injection  30 mg Subcutaneous QHS  . vancomycin  1,000 mg Intravenous Once   Infusions:    . sodium chloride    . sodium chloride    . meropenem (MERREM) IV 1 g (01/11/12 2304)  . meropenem (MERREM) IV    . [COMPLETED] sodium chloride Stopped (01/11/12 2246)  . sodium chloride 500 mL (01/11/12 2301)    Assessment:  78 year old female with recent diagnosis of pneumonia (on Levaquin PTA) presents with cough, congestion and shortness of breath  D-dimer elevated  IV heparin to begin empirically for R/O pulmonary embolism  Goal of Therapy:  Heparin level 0.3-0.7 units/ml Monitor platelets by anticoagulation protocol: Yes   Plan:   Obtain baseline PTT, PT/INR  Heparin 1000 unit IV bolus x 1 then begin heparin infusion @ 550 units/hr  Check heparin level 8 hrs after heparin started  Follow daily heparin level &  CBC while on heparin  Aldair Rickel, Joselyn Glassman, PharmD 01/11/2012,11:38 PM

## 2012-01-11 NOTE — H&P (Signed)
[facility] History and Physical  [name]:[id number] DOB: Feb 03, 1931 DOA: 01/11/2012  Referring physician:  PCP: [name], MD  Specialists:   Chief Complaint: Cough and Pleuritc Chest Pain  HPI: Bianca [name] is a 78 y.o. female who presents to the ED with complaints of worsening Cough and Pleuritic Left sided Chest Pain over the past 24 hours.  She was diagnosed with pneumonia 5 days ago and was discharged form the ED on Levaquin.     Review of Systems: The patient denies anorexia, fever, weight loss, vision loss, decreased hearing, hoarseness, syncope, dyspnea on exertion, peripheral edema, balance deficits, hemoptysis, abdominal pain, melena, hematochezia, severe indigestion/heartburn, hematuria, incontinence, genital sores, muscle weakness, suspicious skin lesions, transient blindness, difficulty walking, depression, unusual weight change, abnormal bleeding, enlarged lymph nodes, angioedema, and breast masses.    Past Medical History  Diagnosis Date  . DIVERTICULOSIS, COLON   . VITAMIN D DEFICIENCY   . HYPERLIPIDEMIA   . HYPERTENSION   . OSTEOPOROSIS   . COPD (chronic obstructive pulmonary disease)    Past Surgical History  Procedure Date  . Cystoscopy 07/2005  . Vertebroplasty 03/2005    Dr. Deanne Coffer    Medications:  HOME MEDS: Prior to Admission medications   Medication Sig Start Date End Date Taking? Authorizing Provider  albuterol (PROVENTIL HFA;VENTOLIN HFA) 108 (90 BASE) MCG/ACT inhaler Inhale 2 puffs into the lungs every 4 (four) hours as needed for wheezing or shortness of breath. 01/20/11 01/20/12 Yes Vida Roller, MD  benzonatate (TESSALON) 200 MG capsule Take 200 mg by mouth 3 (three) times daily as needed. Cough 12/02/11  Yes Newt Lukes, MD  famotidine (PEPCID) 20 MG tablet One at bedtime 12/05/11  Yes Nyoka Cowden, MD  levofloxacin (LEVAQUIN) 750 MG tablet Take 750 mg by mouth every other day.   Yes Historical Provider, MD   losartan (COZAAR) 50 MG tablet Take 25 mg by mouth daily. 09/06/11  Yes Newt Lukes, MD  methocarbamol (ROBAXIN) 500 MG tablet Take 500 mg by mouth 2 (two) times daily. 01/06/12  Yes Ankit Rhunette Croft, MD  mometasone-formoterol (DULERA) 100-5 MCG/ACT AERO 2 puffs. Take 2 puffs first thing in am and then another 2 puffs about 12 hours later. 12/05/11  Yes Nyoka Cowden, MD  NON FORMULARY Adult Gummy multivitamin take 2 daily    Yes Historical Provider, MD  Pseudoeph-Doxylamine-DM-APAP (NYQUIL) 60-7.07-03-998 MG/30ML LIQD Take 30 mLs by mouth as needed. Cold symptoms   Yes Historical Provider, MD  tiotropium (SPIRIVA) 18 MCG inhalation capsule Place 18 mcg into inhaler and inhale daily.   Yes Historical Provider, MD  traMADol (ULTRAM) 50 MG tablet Take 50 mg by mouth every 6 (six) hours as needed. Pain   Yes Historical Provider, MD  vitamin D, CHOLECALCIFEROL, 400 UNITS tablet Take 1,200 Units by mouth daily. 09/06/11  Yes Newt Lukes, MD  denosumab (PROLIA) 60 MG/ML SOLN injection Inject 60 mg into the skin every 6 (six) months. Administer in upper arm, thigh, or abdomen. Dr Hoy Register office 04/15/11   Newt Lukes, MD  levofloxacin (LEVAQUIN) 750 MG tablet Take 1 tablet (750 mg total) by mouth daily. 01/07/12 01/10/12  Derwood Kaplan, MD    Allergies:  Allergies  Allergen Reactions  . Aspirin Nausea And Vomiting  . Codeine Hives  . Penicillins Swelling  . Shrimp (Shellfish Allergy) Swelling    Social History:   reports that she quit smoking about 30 years ago. Her smoking use included Cigarettes. She  started smoking about 61 years ago. She has a 9.3 pack-year smoking history. She has never used smokeless tobacco. She reports that she drinks about 1.2 ounces of alcohol per week. She reports that she does not use illicit drugs.    Family History  Problem Relation Age of Onset  . Heart disease Mother   . Hypertension Mother   . Heart disease Father   . Hypertension Father       Physical Exam:  GEN:  Pleasant Thin elderly sickly appearing 78 year old Caucasian examined  and in no acute distress; cooperative with exam Filed Vitals:   01/11/12 1940 01/11/12 1956  BP: 106/80 144/84  Pulse: 111 104  Temp: 98.5 F (36.9 C) 98.8 F (37.1 C)  TempSrc: Oral Oral  Resp: 24 24  Height: 5' (1.524 m)   Weight: 34.473 kg (76 lb)   SpO2: 92% 95%   Blood pressure 144/84, pulse 104, temperature 98.8 F (37.1 C), temperature source Oral, resp. rate 24, height 5' (1.524 m), weight 34.473 kg (76 lb), SpO2 95.00%. PSYCH: She is alert and oriented x4; does not appear anxious does not appear depressed; affect is normal HEENT: Normocephalic and Atraumatic, Mucous membranes pink; PERRLA; EOM intact; Fundi:  Benign;  No scleral icterus, Nares: Patent, Oropharynx: Clear, Fair Dentition, Neck:  FROM, no cervical lymphadenopathy nor thyromegaly or carotid bruit; no JVD; Breasts:: Not examined CHEST WALL: No tenderness CHEST: Normal respiration, clear to auscultation bilaterally HEART: Regular rate and rhythm; no murmurs rubs or gallops BACK: No kyphosis or scoliosis; no CVA tenderness ABDOMEN: Positive Bowel Sounds, Scaphoid, soft non-tender; no masses, no organomegaly, no pannus; no intertriginous candida. Rectal Exam: Not done EXTREMITIES: No bone or joint deformity; age-appropriate arthropathy of the hands and knees; no cyanosis, clubbing or edema; no ulcerations. Genitalia: not examined PULSES: 2+ and symmetric SKIN: Normal hydration no rash or ulceration CNS: Cranial nerves 2-12 grossly intact no focal neurologic deficit    Labs on Admission:  Basic Metabolic Panel:  Lab 01/11/12 1610 01/06/12 0920  NA 134* 131*  K 3.3* 3.8  CL 93* 94*  CO2 29 25  GLUCOSE 102* 99  BUN 7 10  CREATININE 0.46* 0.37*  CALCIUM 9.0 8.3*  MG -- --  PHOS -- --   Liver Function Tests:  Lab 01/11/12 2030  AST 31  ALT 17  ALKPHOS 106  BILITOT 0.3  PROT 6.9  ALBUMIN 2.7*   No  results found for this basename: LIPASE:5,AMYLASE:5 in the last 168 hours No results found for this basename: AMMONIA:5 in the last 168 hours CBC:  Lab 01/11/12 2030 01/06/12 0920  WBC 19.2* 17.6*  NEUTROABS 15.7* 15.3*  HGB 13.3 12.8  HCT 40.0 38.1  MCV 93.2 95.7  PLT 513* 298   Cardiac Enzymes: No results found for this basename: CKTOTAL:5,CKMB:5,CKMBINDEX:5,TROPONINI:5 in the last 168 hours  BNP (last 3 results) No results found for this basename: PROBNP:3 in the last 8760 hours CBG: No results found for this basename: GLUCAP:5 in the last 168 hours  Radiological Exams on Admission: Dg Chest 2 View  01/11/2012  *RADIOLOGY REPORT*  Clinical Data: Cough, shortness of breath and chest congestion.  CHEST - 2 VIEW  Comparison: 01/06/2012.  Findings: Normal sized heart.  Multifocal patchy airspace opacity in both lungs with improvement in the lower lung zones and mild progression in the mid lung zones.  Minimal bilateral pleural fluid with improvement.  Stable lower thoracic vertebral compression deformities and kyphoplasty material.  Stable diffuse peribronchial  thickening, accentuation of the interstitial markings and hyperexpansion of the lungs.  Old bilateral rib fractures.  IMPRESSION:  1.  Mild overall improvement in bilateral pneumonia. 2.  Stable changes of COPD and chronic bronchitis.   Original Report Authenticated By: Beckie Salts, M.D.     EKG: Independently reviewed. Sinus Tachycardia Q-waves in V1 and V2.  (Old versus New)    Assessment: Principal Problem:  *Healthcare-associated pneumonia Active Problems:  Pleuritic chest pain  COPD exacerbation  SOB (shortness of breath)  HYPERTENSION  COPD (chronic obstructive pulmonary disease)  Chronic respiratory failure    Plan:    Admit to Telemetry Bed IV Meropenem Nebs, O2 Cycle Cardiac Enzymes IV Heparin drip until Rule out PE V/Q scan in AM Reconcile Home Medications    Code Status: FULL CODE Family  Communication:  Family at Bedside Disposition Plan: Return to Home  Time spent:   60 Minutes  Hughes Wyndham C [facility] Pager [phone]   If 7PM-7AM, please contact night-coverage [email or web address] Password [facility] 01/11/2012, 11:29 PM

## 2012-01-12 ENCOUNTER — Observation Stay (HOSPITAL_COMMUNITY): Payer: Medicare Other

## 2012-01-12 DIAGNOSIS — R7989 Other specified abnormal findings of blood chemistry: Secondary | ICD-10-CM

## 2012-01-12 DIAGNOSIS — A0472 Enterocolitis due to Clostridium difficile, not specified as recurrent: Secondary | ICD-10-CM

## 2012-01-12 DIAGNOSIS — R197 Diarrhea, unspecified: Secondary | ICD-10-CM

## 2012-01-12 DIAGNOSIS — J189 Pneumonia, unspecified organism: Principal | ICD-10-CM

## 2012-01-12 LAB — CBC
Hemoglobin: 12 g/dL (ref 12.0–15.0)
Hemoglobin: 11.9 g/dL — ABNORMAL LOW (ref 12.0–15.0)
MCH: 31.4 pg (ref 26.0–34.0)
MCH: 31.1 pg (ref 26.0–34.0)
MCHC: 33.6 g/dL (ref 30.0–36.0)
MCV: 93.5 fL (ref 78.0–100.0)
Platelets: 480 10*3/uL — ABNORMAL HIGH (ref 150–400)
RBC: 3.83 MIL/uL — ABNORMAL LOW (ref 3.87–5.11)
RDW: 13.5 % (ref 11.5–15.5)
WBC: 16 10*3/uL — ABNORMAL HIGH (ref 4.0–10.5)

## 2012-01-12 LAB — TROPONIN I
Troponin I: 1.41 ng/mL (ref <0.30)
Troponin I: 0.4 ng/mL (ref <0.30)

## 2012-01-12 LAB — BASIC METABOLIC PANEL
BUN: 4 mg/dL — ABNORMAL LOW (ref 6–23)
Calcium: 7.8 mg/dL — ABNORMAL LOW (ref 8.4–10.5)
Creatinine, Ser: 0.37 mg/dL — ABNORMAL LOW (ref 0.50–1.10)
GFR calc Af Amer: >90 mL/min (ref >90)
GFR calc non Af Amer: >90 mL/min (ref >90)
Glucose, Bld: 90 mg/dL (ref 70–99)
Potassium: 3.2 mEq/L — ABNORMAL LOW (ref 3.5–5.1)

## 2012-01-12 LAB — PROTIME-INR: INR: 1.19 (ref 0.00–1.49)

## 2012-01-12 LAB — CK TOTAL AND CKMB (NOT AT ARMC)
CK, MB: 5.9 ng/mL — ABNORMAL HIGH (ref 0.3–4.0)
CK, MB: 5.3 ng/mL — ABNORMAL HIGH (ref 0.3–4.0)
Relative Index: INVALID (ref 0.0–2.5)
Total CK: 66 U/L (ref 7–177)

## 2012-01-12 LAB — APTT: aPTT: 60 seconds — ABNORMAL HIGH (ref 24–37)

## 2012-01-12 LAB — HEPARIN LEVEL (UNFRACTIONATED): Heparin Unfractionated: 0.12 IU/mL — ABNORMAL LOW (ref 0.30–0.70)

## 2012-01-12 MED ORDER — TIOTROPIUM BROMIDE MONOHYDRATE 18 MCG IN CAPS
18.0000 ug | ORAL_CAPSULE | Freq: Every day | RESPIRATORY_TRACT | Status: DC
  Administered 2012-01-12 – 2012-01-17 (×6): 18 ug via RESPIRATORY_TRACT
  Filled 2012-01-12: qty 5

## 2012-01-12 MED ORDER — POTASSIUM CHLORIDE CRYS ER 20 MEQ PO TBCR
40.0000 meq | EXTENDED_RELEASE_TABLET | Freq: Once | ORAL | Status: AC
  Administered 2012-01-12: 40 meq via ORAL
  Filled 2012-01-12: qty 2

## 2012-01-12 MED ORDER — METHOCARBAMOL 500 MG PO TABS
500.0000 mg | ORAL_TABLET | Freq: Two times a day (BID) | ORAL | Status: DC
  Administered 2012-01-12 – 2012-01-16 (×11): 500 mg via ORAL
  Filled 2012-01-12 (×13): qty 1

## 2012-01-12 MED ORDER — ALBUTEROL SULFATE (5 MG/ML) 0.5% IN NEBU
2.5000 mg | INHALATION_SOLUTION | Freq: Four times a day (QID) | RESPIRATORY_TRACT | Status: DC
  Administered 2012-01-12 – 2012-01-16 (×13): 2.5 mg via RESPIRATORY_TRACT
  Filled 2012-01-12 (×14): qty 0.5

## 2012-01-12 MED ORDER — HEPARIN (PORCINE) IN NACL 100-0.45 UNIT/ML-% IJ SOLN
850.0000 [IU]/h | INTRAMUSCULAR | Status: DC
  Filled 2012-01-12: qty 250

## 2012-01-12 MED ORDER — LOSARTAN POTASSIUM 25 MG PO TABS
25.0000 mg | ORAL_TABLET | Freq: Every day | ORAL | Status: DC
  Administered 2012-01-12 – 2012-01-17 (×6): 25 mg via ORAL
  Filled 2012-01-12 (×6): qty 1

## 2012-01-12 MED ORDER — HEPARIN BOLUS VIA INFUSION
1000.0000 [IU] | Freq: Once | INTRAVENOUS | Status: AC
  Administered 2012-01-12: 1000 [IU] via INTRAVENOUS
  Filled 2012-01-12: qty 1000

## 2012-01-12 MED ORDER — METRONIDAZOLE 500 MG PO TABS
500.0000 mg | ORAL_TABLET | Freq: Three times a day (TID) | ORAL | Status: DC
  Administered 2012-01-12 – 2012-01-17 (×14): 500 mg via ORAL
  Filled 2012-01-12 (×22): qty 1

## 2012-01-12 MED ORDER — SODIUM CHLORIDE 0.9 % IV SOLN
1.0000 g | Freq: Two times a day (BID) | INTRAVENOUS | Status: DC
  Administered 2012-01-12 – 2012-01-16 (×8): 1 g via INTRAVENOUS
  Filled 2012-01-12 (×10): qty 1

## 2012-01-12 MED ORDER — CHOLECALCIFEROL 10 MCG (400 UNIT) PO TABS
1200.0000 [IU] | ORAL_TABLET | Freq: Every day | ORAL | Status: DC
  Administered 2012-01-12 – 2012-01-17 (×6): 1200 [IU] via ORAL
  Filled 2012-01-12 (×6): qty 3

## 2012-01-12 MED ORDER — DEXTROMETHORPHAN POLISTIREX 30 MG/5ML PO LQCR
30.0000 mg | Freq: Two times a day (BID) | ORAL | Status: DC
  Administered 2012-01-12 – 2012-01-13 (×4): 30 mg via ORAL
  Filled 2012-01-12 (×5): qty 5

## 2012-01-12 MED ORDER — ALBUTEROL SULFATE (5 MG/ML) 0.5% IN NEBU: 2.5000 mg | INHALATION_SOLUTION | RESPIRATORY_TRACT | Status: DC | PRN

## 2012-01-12 MED ORDER — TECHNETIUM TO 99M ALBUMIN AGGREGATED
5.0000 | Freq: Once | INTRAVENOUS | Status: AC | PRN
  Administered 2012-01-12: 5 via INTRAVENOUS

## 2012-01-12 MED ORDER — FAMOTIDINE 20 MG PO TABS
20.0000 mg | ORAL_TABLET | Freq: Two times a day (BID) | ORAL | Status: DC
  Administered 2012-01-12 – 2012-01-17 (×12): 20 mg via ORAL
  Filled 2012-01-12 (×14): qty 1

## 2012-01-12 MED ORDER — TECHNETIUM TC 99M DIETHYLENETRIAME-PENTAACETIC ACID: 46.0000 | Freq: Once | INTRAVENOUS | Status: AC | PRN

## 2012-01-12 NOTE — Progress Notes (Signed)
[facility] progress note. Chief complaint. Elevated troponin. History of present illness. This 78 year old female admitted with complaints of cough and pleuritic chest pain. Initial impression is of a health care associated pneumonia though the patient did have an elevated d-dimer and pulmonary emboli is still within the differential. Patient is to have a CT angiogram of the chest to rule out PE but this is still pending. As part of her workup she also had cardiac enzymes and the first troponin has resulted elevated 1.41. The patient is on a heparin drip pending the CT chest results. Any EKG was obtained and the reason normal sinus rhythm with a septal infarct age indeterminate. Patient does appear to have some ST elevation in V2 but otherwise does not look remarkable. Again the patient currently denies dyspnea or chest pain. Vital signs. Temperature 98.8, pulse 98, respiration 27, blood pressure 112/64. O2 sats 93%. On nasal cannula oxygen. General appearance. This is a thin elderly female who is alert, cooperative and in no distress. Cardiac. Rate and rhythm regular. No jugular venous distention or significant edema. Lungs. Breath sounds are clear. Stable O2 sats. Abdomen. Soft with positive bowel sounds. No pain. Impression/plan. Problem #1. Elevated troponin. I did discuss the case with Dr. Terressa Koyanagi on-call for [facility] cardiology. He requests we added aspirin to the current regime but I have not done so due to patient allergy. Cardiology does agree with the heparin drip. Cardiology will see the patient later this a.m. Currently the patient appears clinically stable and in no distress.

## 2012-01-12 NOTE — Progress Notes (Signed)
01-12-12 NSG:  Between 0015 and 0045 I called to ED x3, to get report and nurse unavailable to give report until 0045.

## 2012-01-12 NOTE — Progress Notes (Signed)
01-12-12  NSG:  Lab reports her troponi is now 1.41, her CKmb5.9, pt is resting comfortably in bed, vss, continues to have a congested, non productive cough, reports her left cp, and r neck pain after iv pain medication is gone, only sob c exertion, have notified Lenny Pastel np and am awaiting acknowledgement of labs.

## 2012-01-12 NOTE — ED Notes (Signed)
Attempted to give report. Receiving RN busy. 

## 2012-01-12 NOTE — Progress Notes (Signed)
ANTICOAGULATION CONSULT NOTE - Follow Up Consult  Pharmacy Consult for Heparin Indication: R/O PE, ACS  Allergies  Allergen Reactions  . Aspirin Nausea And Vomiting  . Codeine Hives  . Penicillins Swelling  . Shrimp (Shellfish Allergy) Swelling    Patient Measurements: Height: 5' (152.4 cm) Weight: 76 lb 15.1 oz (34.9 kg) IBW/kg (Calculated) : 45.5    Vital Signs: Temp: 98.4 F (36.9 C) (12/08 1314) Temp src: Oral (12/08 1314) BP: 114/68 mmHg (12/08 1314) Pulse Rate: 94  (12/08 1314)  Labs:  Basename 01/12/12 1820 01/12/12 1210 01/12/12 0825 01/12/12 0641 01/12/12 0040 01/11/12 2030  HGB -- -- 11.9* 12.0 -- --  HCT -- -- 35.8* 35.7* -- 40.0  PLT -- -- 480* 469* -- 513*  APTT -- -- -- -- 60* --  LABPROT -- -- -- -- 14.9 --  INR -- -- -- -- 1.19 --  HEPARINUNFRC 0.12* -- 0.10* -- -- --  CREATININE -- -- -- 0.37* -- 0.46*  CKTOTAL -- 59 -- 63 66 --  CKMB -- 5.3* -- 5.7* 5.9* --  TROPONINI -- 0.40* -- 0.63* 1.41* --    Estimated Creatinine Clearance: 31.4 ml/min (by C-G formula based on Cr of 0.37).   Medications:  Scheduled:    . sodium chloride   Intravenous STAT  . albuterol  2.5 mg Nebulization Q6H WA  . cholecalciferol  1,200 Units Oral Daily  . dextromethorphan  30 mg Oral BID  . famotidine  20 mg Oral BID  . [COMPLETED] heparin  1,000 Units Intravenous Once  . [COMPLETED] heparin  1,000 Units Intravenous Once  . losartan  25 mg Oral Daily  . meropenem (MERREM) IV  1 g Intravenous Q12H  . methocarbamol  500 mg Oral BID  . [COMPLETED] potassium chloride  40 mEq Oral Once  . [COMPLETED] sodium chloride  500 mL Intravenous Once  . [COMPLETED] sodium chloride  500 mL Intravenous Once  . tiotropium  18 mcg Inhalation Daily  . [COMPLETED] vancomycin  1,000 mg Intravenous Once  . [DISCONTINUED] albuterol  2.5 mg Nebulization Q6H  . [DISCONTINUED] enoxaparin (LOVENOX) injection  30 mg Subcutaneous QHS  . [DISCONTINUED] meropenem (MERREM) IV  1 g Intravenous  Q8H  . [DISCONTINUED] meropenem (MERREM) IV  500 mg Intravenous Q8H   Infusions:    . sodium chloride    . heparin 700 Units/hr (01/12/12 0947)   PRN: acetaminophen, acetaminophen, albuterol, alum & mag hydroxide-simeth, HYDROmorphone (DILAUDID) injection, ondansetron (ZOFRAN) IV, ondansetron, [COMPLETED] technetium albumin aggregated, technetium TC 15M diethylenetriame-pentaacetic acid, zolpidem, [DISCONTINUED] albuterol  Assessment: . Hep level is subtherapeutic at 700 units/hr; no IV line problem per RN, no bleeding reported . VQ Scan indeterminate probability for PE  Goal of Therapy:  Heparin level 0.3-0.7 units/ml Monitor platelets by anticoagulation protocol: Yes   Plan:  . Rebolus with heparin 1000 units IV x 1 then increase heparin rate to 850 units/hr . Will check 6 hr HL  Dorethea Clan 01/12/2012,7:24 PM

## 2012-01-12 NOTE — Progress Notes (Signed)
[facility] PROGRESS NOTE  Bianca [name] ZOX:[id number] DOB: 01/13/1931 DOA: 01/11/2012 PCP: [name], MD  Assessment/Plan:  SOB:  DDX includes HCAP, ACS, PE, COPD exacerbation:  Healthcare-associated pneumonia: -  Continue meropenem -  BCx 12/7 pending -  Sputum culture -  Legionella Ag  -  Step pneumo Ag -  Continue oxygen prn  Chest pain:  Pneumonia versus PE versus NSTEMI.  Most likely related to pneumonia.  Elevated troponin and subtle changes to ECG.  Per cardiology, low suspicion for ACS, however, it troponins continue to rise, will need to start BB and statin -  Appreciate cardiology assistance -  F/u troponins -  Continue telemetry -  F/u CTa chest -  Continue heparin and ASA -  ECHO  COPD exacerbation: -  Albuterol scheduled and prn -  Spiriva -  Consider pulmicort or steroids if no improvement with above therapies  Also:  Loose stools:  At risk for C. Diff given recent Abx -  C. Diff PCR  Hypertension:    -  Continue losartan  Leukocytosis, trending down on antibiotics, trend WBC Acute normocytic anemia, likely due to IVF and marrow suppression from acute infection, trend hgb Thrombocytosis, likely elevated as acute phase reactant:  Trend  Hyponatremia, mild:  May have some SIADH 2/2 pneumonia versus dehydration from insensible losses -  Trend -  Hydrate.  If no improvement with hydration and euvolemic, then will free water restrict Hypokalemia, likely due to dehydration and medication side effect -  Potassium po once  DIET:  Clear liquid diet, advance as tolerated ACCESS:  PIV IVF:  NS at 72ml/h PROPH:  Heparin gtt  Code Status: Full code Family Communication: spoke to patient who was alone at bedside Disposition Plan: pending improvement in respiratory status, further work up of elevated troponin and rule out PE   Consultants:  Cardiology  Procedures:  CT a chest  CXR  Antibiotics:  Vancomycin x 1 12/7  Meropenem  12/7 >>   HPI/Subjective:  Denies SOB while on oxygen.  Has cough that sounds rhonchorous, but has not been able to cough up sputum.  Denies wheeze.  Neck pain has improved.  Denies fevers, chills, nausea, vomiting.  She had two loose stools overnight.  Denies blood in stools, abdominal pain.   Objective: Filed Vitals:   01/12/12 0000 01/12/12 0004 01/12/12 0100 01/12/12 0543  BP:  112/64 151/94 105/63  Pulse: 100 98 95 118  Temp:   97.5 F (36.4 C) 98.5 F (36.9 C)  TempSrc:   Oral Oral  Resp: 25 27 22 20   Height:   5' (1.524 m)   Weight:   34.9 kg (76 lb 15.1 oz)   SpO2: 95% 93% 95% 93%    Intake/Output Summary (Last 24 hours) at 01/12/12 0916 Last data filed at 01/12/12 0606  Gross per 24 hour  Intake 1048.5 ml  Output      0 ml  Net 1048.5 ml   Filed Weights   01/11/12 1940 01/12/12 0100  Weight: 34.473 kg (76 lb) 34.9 kg (76 lb 15.1 oz)    Exam:   General:  Thin CF, no acute distress, lying in bed with nasal canula in place  HEENT:  MMM  Cardiovascular: RRR, no murmurs, rubs, or gallops, 2+ pulses  Respiratory: Bronchial BS and rales bilaterally  Abdomen: NABS, soft, nondistended, nontender  MSK:  No LEE  Data Reviewed: Basic Metabolic Panel:  Lab 01/12/12 8119 01/11/12 2030 01/06/12 0920  NA 134*  134* 131*  K 3.2* 3.3* 3.8  CL 98 93* 94*  CO2 26 29 25   GLUCOSE 90 102* 99  BUN 4* 7 10  CREATININE 0.37* 0.46* 0.37*  CALCIUM 7.8* 9.0 8.3*  MG -- -- --  PHOS -- -- --   Liver Function Tests:  Lab 01/11/12 2030  AST 31  ALT 17  ALKPHOS 106  BILITOT 0.3  PROT 6.9  ALBUMIN 2.7*   No results found for this basename: LIPASE:5,AMYLASE:5 in the last 168 hours No results found for this basename: AMMONIA:5 in the last 168 hours CBC:  Lab 01/12/12 0825 01/12/12 0641 01/11/12 2030 01/06/12 0920  WBC 16.0* 17.2* 19.2* 17.6*  NEUTROABS -- -- 15.7* 15.3*  HGB 11.9* 12.0 13.3 12.8  HCT 35.8* 35.7* 40.0 38.1  MCV 93.5 93.5 93.2 95.7  PLT 480* 469*  513* 298   Cardiac Enzymes:  Lab 01/12/12 0040  CKTOTAL 66  CKMB 5.9*  CKMBINDEX --  TROPONINI 1.41*   BNP (last 3 results) No results found for this basename: PROBNP:3 in the last 8760 hours CBG: No results found for this basename: GLUCAP:5 in the last 168 hours  No results found for this or any previous visit (from the past 240 hour(s)).   Studies: Dg Chest 2 View  01/11/2012  *RADIOLOGY REPORT*  Clinical Data: Cough, shortness of breath and chest congestion.  CHEST - 2 VIEW  Comparison: 01/06/2012.  Findings: Normal sized heart.  Multifocal patchy airspace opacity in both lungs with improvement in the lower lung zones and mild progression in the mid lung zones.  Minimal bilateral pleural fluid with improvement.  Stable lower thoracic vertebral compression deformities and kyphoplasty material.  Stable diffuse peribronchial thickening, accentuation of the interstitial markings and hyperexpansion of the lungs.  Old bilateral rib fractures.  IMPRESSION:  1.  Mild overall improvement in bilateral pneumonia. 2.  Stable changes of COPD and chronic bronchitis.   Original Report Authenticated By: Beckie Salts, M.D.     Scheduled Meds:   . sodium chloride   Intravenous STAT  . albuterol  2.5 mg Nebulization Q6H WA  . cholecalciferol  1,200 Units Oral Daily  . dextromethorphan  30 mg Oral BID  . famotidine  20 mg Oral BID  . [COMPLETED] heparin  1,000 Units Intravenous Once  . losartan  25 mg Oral Daily  . meropenem (MERREM) IV  500 mg Intravenous Q8H  . methocarbamol  500 mg Oral BID  . potassium chloride  40 mEq Oral Once  . [COMPLETED] sodium chloride  500 mL Intravenous Once  . [COMPLETED] sodium chloride  500 mL Intravenous Once  . tiotropium  18 mcg Inhalation Daily  . [COMPLETED] vancomycin  1,000 mg Intravenous Once  . [DISCONTINUED] albuterol  2.5 mg Nebulization Q6H  . [DISCONTINUED] enoxaparin (LOVENOX) injection  30 mg Subcutaneous QHS  . [DISCONTINUED] meropenem (MERREM)  IV  1 g Intravenous Q8H   Continuous Infusions:   . sodium chloride    . heparin 550 Units/hr (01/11/12 2358)    Principal Problem:  *Healthcare-associated pneumonia Active Problems:  HYPERTENSION  COPD (chronic obstructive pulmonary disease)  COPD exacerbation  Chronic respiratory failure  Pleuritic chest pain  SOB (shortness of breath)  Elevated troponin    Time spent: 30    Fay Bagg, [facility]  [facility] Pager [phone]. If 8PM-8AM, please contact night-coverage at [email or web address], password [facility] 01/12/2012, 9:16 AM  LOS: 1 day

## 2012-01-12 NOTE — Consult Note (Addendum)
CARDIOLOGY CONSULT NOTE  Patient ID: Bianca [name] MRN: [id number] DOB/AGE: 06-05-1930 78 y.o.  Admit date: 01/11/2012 Primary PhysicianValerie Felicity Coyer, MD Primary Cardiologist  HPI: The patient is here with a cough and respiratory illness. She was recently here with pneumonia and discharged on Levaquin. She has return feeling worse with increased cough. D-dimer is elevated. It has been elevated in the past with a negative VQ scan at that time. Chest CT is ordered for this admission. The patient has no prior cardiac history. There is no evidence of a prior echo being done. There is one troponin value from the past that was not elevated. With this admission the patient has had some discomfort in her side related to coughing. A single EKG reveals decreased R wave in V2. The first troponin is 0.9. A second troponin is 1.4. Total CPK is normal but MB is slightly increased. Cardiology is consulted to help with further decisions about possible cardiac workup. Prior to admission the patient was not having any significant chest pain.   Past Medical History  Diagnosis Date  . DIVERTICULOSIS, COLON   . VITAMIN D DEFICIENCY   . HYPERLIPIDEMIA   . HYPERTENSION   . OSTEOPOROSIS   . COPD (chronic obstructive pulmonary disease)     Family History  Problem Relation Age of Onset  . Heart disease Mother   . Hypertension Mother   . Heart disease Father   . Hypertension Father     History   Social History  . Marital Status: Married    Spouse Name: N/A    Number of Children: N/A  . Years of Education: N/A   Occupational History  . Not on file.   Social History Main Topics  . Smoking status: Former Smoker -- 0.3 packs/day for 31 years    Types: Cigarettes    Start date: 02/04/1950    Quit date: 02/04/1981  . Smokeless tobacco: Never Used     Comment: Married, lives with spouse. retired  . Alcohol Use: 1.2 oz/week    2 Glasses of wine per week  . Drug Use: No  . Sexually Active: Not  on file   Other Topics Concern  . Not on file   Social History Narrative  . No narrative on file    Past Surgical History  Procedure Date  . Cystoscopy 07/2005  . Vertebroplasty 03/2005    Dr. Deanne Coffer     Prescriptions prior to admission  Medication Sig Dispense Refill  . albuterol (PROVENTIL HFA;VENTOLIN HFA) 108 (90 BASE) MCG/ACT inhaler Inhale 2 puffs into the lungs every 4 (four) hours as needed for wheezing or shortness of breath.  1 Inhaler  3  . benzonatate (TESSALON) 200 MG capsule Take 200 mg by mouth 3 (three) times daily as needed. Cough      . famotidine (PEPCID) 20 MG tablet One at bedtime  30 tablet  11  . levofloxacin (LEVAQUIN) 750 MG tablet Take 750 mg by mouth every other day.      . losartan (COZAAR) 50 MG tablet Take 25 mg by mouth daily.      . methocarbamol (ROBAXIN) 500 MG tablet Take 500 mg by mouth 2 (two) times daily.      . mometasone-formoterol (DULERA) 100-5 MCG/ACT AERO 2 puffs. Take 2 puffs first thing in am and then another 2 puffs about 12 hours later.      . NON FORMULARY Adult Gummy multivitamin take 2 daily       .  Pseudoeph-Doxylamine-DM-APAP (NYQUIL) 60-7.07-03-998 MG/30ML LIQD Take 30 mLs by mouth as needed. Cold symptoms      . tiotropium (SPIRIVA) 18 MCG inhalation capsule Place 18 mcg into inhaler and inhale daily.      . traMADol (ULTRAM) 50 MG tablet Take 50 mg by mouth every 6 (six) hours as needed. Pain      . vitamin D, CHOLECALCIFEROL, 400 UNITS tablet Take 1,200 Units by mouth daily.      Marland Kitchen denosumab (PROLIA) 60 MG/ML SOLN injection Inject 60 mg into the skin every 6 (six) months. Administer in upper arm, thigh, or abdomen. Dr Hoy Register office  1 mL      Review of systems:  At this time the patient denies headache, rash, change in vision, change in hearing, nausea vomiting, urinary symptoms. All other systems are reviewed and are negative other than the history of present illness.  Physical Exam: Blood pressure 105/63, pulse 118,  temperature 98.5 F (36.9 C), temperature source Oral, resp. rate 20, height 5' (1.524 m), weight 76 lb 15.1 oz (34.9 kg), SpO2 93.00%.    Patient is stable at this time. She is thin and frail. She is oriented to person time and place. Affect is normal. She is coughing intermittently during the exam. She does not yet have sputum production. There is no jugulovenous distention. Lungs reveal diffuse rhonchi. Cardiac exam reveals S1 and S2. There no clicks or significant murmurs. The abdomen is soft. There is no peripheral edema. There are no skin rashes.  Labs:   Lab Results  Component Value Date   WBC 17.2* 01/12/2012   HGB 12.0 01/12/2012   HCT 35.7* 01/12/2012   MCV 93.5 01/12/2012   PLT 469* 01/12/2012   Lab Results  Component Value Date   CKTOTAL 66 01/12/2012   CKMB 5.9* 01/12/2012   TROPONINI 1.41* 01/12/2012       Radiology: Dg Chest 2 View  01/11/2012  *RADIOLOGY REPORT*  Clinical Data: Cough, shortness of breath and chest congestion.  CHEST - 2 VIEW  Comparison: 01/06/2012.  Findings: Normal sized heart.  Multifocal patchy airspace opacity in both lungs with improvement in the lower lung zones and mild progression in the mid lung zones.  Minimal bilateral pleural fluid with improvement.  Stable lower thoracic vertebral compression deformities and kyphoplasty material.  Stable diffuse peribronchial thickening, accentuation of the interstitial markings and hyperexpansion of the lungs.  Old bilateral rib fractures.  IMPRESSION:  1.  Mild overall improvement in bilateral pneumonia. 2.  Stable changes of COPD and chronic bronchitis.   Original Report Authenticated By: Beckie Salts, M.D.    EKG:  I have reviewed the EKG from today. There is decreased R wave in lead V2. I wonder if this is lead placement. There is an R wave in V1. EKG will be repeated.   ASSESSMENT AND PLAN:    *Healthcare-associated pneumonia    Patient has significant ongoing pulmonary process. This is being managed by the  primary team.   Chronic respiratory failure    The patient is worse now. I suspect that the majority of all of the issues are related to her lung disease.   Pleuritic chest pain    It appears that this is related to her cough.   Elevated troponin     At this point there is no definite proof of a coronary process. Further troponins will be checked. CT scan is being done to rule out pulmonary embolus. He will be helpful also to obtain a  2-D echo to further assess LV function. EKG will be repeated to reassess leads V1 and V2. Based on all the information going forward, we will assess the patient with you further. This elevated troponin is probably related to her pulmonary process. Plan to continue aspirin that his are even started. Plan to continue heparin until we have further information.  Hypokalemia  I will give the patient a dose of potassium this morning.  Jerral Bonito, MD

## 2012-01-12 NOTE — Progress Notes (Signed)
ANTICOAGULATION CONSULT NOTE   Pharmacy Consult for Heparin Indication: R/O Pulmonary Embolism, Elevated troponin  Allergies  Allergen Reactions  . Aspirin Nausea And Vomiting  . Codeine Hives  . Penicillins Swelling  . Shrimp (Shellfish Allergy) Swelling    Patient Measurements: Height: 5' (152.4 cm) Weight: 76 lb 15.1 oz (34.9 kg) IBW/kg (Calculated) : 45.5   Vital Signs: Temp: 98.5 F (36.9 C) (12/08 0543) Temp src: Oral (12/08 0543) BP: 105/63 mmHg (12/08 0543) Pulse Rate: 118  (12/08 0543)  Labs:  Basename 01/12/12 0641 01/12/12 0040 01/11/12 2030  HGB 12.0 -- 13.3  HCT 35.7* -- 40.0  PLT 469* -- 513*  APTT -- 60* --  LABPROT -- 14.9 --  INR -- 1.19 --  HEPARINUNFRC -- -- --  CREATININE 0.37* -- 0.46*  CKTOTAL -- 66 --  CKMB -- 5.9* --  TROPONINI -- 1.41* --   Estimated Creatinine Clearance: 31.4 ml/min (by C-G formula based on Cr of 0.37).  Medications:  Scheduled:     . sodium chloride   Intravenous STAT  . albuterol  2.5 mg Nebulization Q6H WA  . cholecalciferol  1,200 Units Oral Daily  . dextromethorphan  30 mg Oral BID  . famotidine  20 mg Oral BID  . [COMPLETED] heparin  1,000 Units Intravenous Once  . losartan  25 mg Oral Daily  . meropenem (MERREM) IV  500 mg Intravenous Q8H  . methocarbamol  500 mg Oral BID  . potassium chloride  40 mEq Oral Once  . [COMPLETED] sodium chloride  500 mL Intravenous Once  . [COMPLETED] sodium chloride  500 mL Intravenous Once  . tiotropium  18 mcg Inhalation Daily  . [COMPLETED] vancomycin  1,000 mg Intravenous Once  . [DISCONTINUED] albuterol  2.5 mg Nebulization Q6H  . [DISCONTINUED] enoxaparin (LOVENOX) injection  30 mg Subcutaneous QHS  . [DISCONTINUED] meropenem (MERREM) IV  1 g Intravenous Q8H   Infusions:     . sodium chloride    . heparin 550 Units/hr (01/11/12 2358)    Assessment:  81 YOF admit with pneumonia.  Pharmacy asked to dose IV heparin for elevated D-dimer, CT pending to r/o PE, and  troponin also elevated (1.4).    First heparin level subtherapeutic at 0.1  CBC is stable  Goal of Therapy:  Heparin level 0.3-0.7 units/ml Monitor platelets by anticoagulation protocol: Yes   Plan:   Give heparin 1000 units bolus IV x 1  Increase heparin IV infusion to 700 units/hr  Heparin level 8 hours after rate change  Daily heparin level and CBC  Continue to monitor H&H and platelets   Lynann Beaver PharmD, BCPS Pager [phone] 01/12/2012 9:46 AM

## 2012-01-13 DIAGNOSIS — I2699 Other pulmonary embolism without acute cor pulmonale: Secondary | ICD-10-CM

## 2012-01-13 DIAGNOSIS — R0602 Shortness of breath: Secondary | ICD-10-CM

## 2012-01-13 DIAGNOSIS — J209 Acute bronchitis, unspecified: Secondary | ICD-10-CM

## 2012-01-13 DIAGNOSIS — R071 Chest pain on breathing: Secondary | ICD-10-CM

## 2012-01-13 DIAGNOSIS — R9431 Abnormal electrocardiogram [ECG] [EKG]: Secondary | ICD-10-CM

## 2012-01-13 DIAGNOSIS — A0472 Enterocolitis due to Clostridium difficile, not specified as recurrent: Secondary | ICD-10-CM

## 2012-01-13 DIAGNOSIS — J449 Chronic obstructive pulmonary disease, unspecified: Secondary | ICD-10-CM

## 2012-01-13 DIAGNOSIS — I10 Essential (primary) hypertension: Secondary | ICD-10-CM

## 2012-01-13 DIAGNOSIS — J9610 Chronic respiratory failure, unspecified whether with hypoxia or hypercapnia: Secondary | ICD-10-CM

## 2012-01-13 LAB — CBC
MCHC: 33.6 g/dL (ref 30.0–36.0)
RDW: 13.7 % (ref 11.5–15.5)
WBC: 14.1 10*3/uL — ABNORMAL HIGH (ref 4.0–10.5)

## 2012-01-13 LAB — BASIC METABOLIC PANEL
GFR calc Af Amer: >90 mL/min (ref >90)
GFR calc non Af Amer: >90 mL/min (ref >90)
Potassium: 3.2 mEq/L — ABNORMAL LOW (ref 3.5–5.1)
Sodium: 135 mEq/L (ref 135–145)

## 2012-01-13 LAB — STREP PNEUMONIAE URINARY ANTIGEN: Strep Pneumo Urinary Antigen: NEGATIVE

## 2012-01-13 LAB — LEGIONELLA ANTIGEN, URINE: Legionella Antigen, Urine: NEGATIVE

## 2012-01-13 LAB — PRO B NATRIURETIC PEPTIDE: Pro B Natriuretic peptide (BNP): 2922 pg/mL — ABNORMAL HIGH (ref 0–450)

## 2012-01-13 LAB — HEPARIN LEVEL (UNFRACTIONATED)
Heparin Unfractionated: 0.18 IU/mL — ABNORMAL LOW (ref 0.30–0.70)
Heparin Unfractionated: 0.53 IU/mL (ref 0.30–0.70)

## 2012-01-13 MED ORDER — HEPARIN (PORCINE) IN NACL 100-0.45 UNIT/ML-% IJ SOLN
1000.0000 [IU]/h | INTRAMUSCULAR | Status: AC
  Administered 2012-01-13 (×2): 1000 [IU]/h via INTRAVENOUS
  Filled 2012-01-13 (×3): qty 250

## 2012-01-13 MED ORDER — METHYLPREDNISOLONE SODIUM SUCC 40 MG IJ SOLR
40.0000 mg | Freq: Two times a day (BID) | INTRAMUSCULAR | Status: DC
  Administered 2012-01-13 – 2012-01-14 (×2): 40 mg via INTRAVENOUS
  Filled 2012-01-13 (×4): qty 1

## 2012-01-13 MED ORDER — ENOXAPARIN SODIUM 60 MG/0.6ML ~~LOC~~ SOLN
1.5000 mg/kg | SUBCUTANEOUS | Status: DC
  Filled 2012-01-13: qty 0.6

## 2012-01-13 MED ORDER — HEPARIN (PORCINE) IN NACL 100-0.45 UNIT/ML-% IJ SOLN
1000.0000 [IU]/h | INTRAMUSCULAR | Status: DC
  Filled 2012-01-13: qty 250

## 2012-01-13 MED ORDER — WARFARIN SODIUM 4 MG PO TABS
4.0000 mg | ORAL_TABLET | Freq: Once | ORAL | Status: DC
  Filled 2012-01-13: qty 1

## 2012-01-13 MED ORDER — WARFARIN SODIUM 4 MG PO TABS: 4.0000 mg | ORAL_TABLET | Freq: Once | ORAL | Status: DC

## 2012-01-13 MED ORDER — DM-GUAIFENESIN ER 30-600 MG PO TB12
1.0000 | ORAL_TABLET | Freq: Two times a day (BID) | ORAL | Status: DC
  Filled 2012-01-13 (×2): qty 1

## 2012-01-13 MED ORDER — HEPARIN BOLUS VIA INFUSION
1000.0000 [IU] | Freq: Once | INTRAVENOUS | Status: AC
Start: 2012-01-13 — End: 2012-01-13
  Administered 2012-01-13: 1000 [IU] via INTRAVENOUS
  Filled 2012-01-13: qty 1000

## 2012-01-13 MED ORDER — ENOXAPARIN SODIUM 60 MG/0.6ML ~~LOC~~ SOLN: 1.5000 mg/kg | SUBCUTANEOUS | Status: DC

## 2012-01-13 MED ORDER — POTASSIUM CHLORIDE 20 MEQ/15ML (10%) PO LIQD
40.0000 meq | Freq: Once | ORAL | Status: AC
  Administered 2012-01-13: 40 meq via ORAL
  Filled 2012-01-13: qty 30

## 2012-01-13 MED ORDER — BUDESONIDE 0.5 MG/2ML IN SUSP
0.5000 mg | Freq: Two times a day (BID) | RESPIRATORY_TRACT | Status: DC
  Administered 2012-01-13 – 2012-01-17 (×7): 0.5 mg via RESPIRATORY_TRACT
  Filled 2012-01-13 (×11): qty 2

## 2012-01-13 MED ORDER — POTASSIUM CHLORIDE CRYS ER 20 MEQ PO TBCR
40.0000 meq | EXTENDED_RELEASE_TABLET | Freq: Once | ORAL | Status: DC
  Filled 2012-01-13: qty 2

## 2012-01-13 MED ORDER — BENZONATATE 100 MG PO CAPS
100.0000 mg | ORAL_CAPSULE | Freq: Three times a day (TID) | ORAL | Status: DC | PRN
  Filled 2012-01-13: qty 1

## 2012-01-13 MED ORDER — WARFARIN - PHARMACIST DOSING INPATIENT: Freq: Every day | Status: DC

## 2012-01-13 MED ORDER — ENOXAPARIN SODIUM 40 MG/0.4ML ~~LOC~~ SOLN
1.0000 mg/kg | Freq: Once | SUBCUTANEOUS | Status: DC
  Filled 2012-01-13: qty 0.4

## 2012-01-13 NOTE — Progress Notes (Signed)
ANTICOAGULATION CONSULT NOTE - Follow Up Consult  Pharmacy Consult for Heparin Indication: R/O PE, ACS  Allergies  Allergen Reactions  . Aspirin Nausea And Vomiting  . Codeine Hives  . Penicillins Swelling  . Shrimp (Shellfish Allergy) Swelling    Patient Measurements: Height: 5' (152.4 cm) Weight: 76 lb 15.1 oz (34.9 kg) IBW/kg (Calculated) : 45.5    Vital Signs: Temp: 98.7 F (37.1 C) (12/08 2057) Temp src: Oral (12/08 2057) BP: 107/60 mmHg (12/08 2057) Pulse Rate: 90  (12/08 2057)  Labs:  Basename 01/13/12 0205 01/12/12 1820 01/12/12 1210 01/12/12 0825 01/12/12 0641 01/12/12 0040 01/11/12 2030  HGB 11.7* -- -- 11.9* -- -- --  HCT 34.8* -- -- 35.8* 35.7* -- --  PLT 483* -- -- 480* 469* -- --  APTT -- -- -- -- -- 60* --  LABPROT -- -- -- -- -- 14.9 --  INR -- -- -- -- -- 1.19 --  HEPARINUNFRC 0.18* 0.12* -- 0.10* -- -- --  CREATININE -- -- -- -- 0.37* -- 0.46*  CKTOTAL -- -- 59 -- 63 66 --  CKMB -- -- 5.3* -- 5.7* 5.9* --  TROPONINI -- -- 0.40* -- 0.63* 1.41* --    Estimated Creatinine Clearance: 31.4 ml/min (by C-G formula based on Cr of 0.37).   Medications:  Scheduled:     . [EXPIRED] sodium chloride   Intravenous STAT  . albuterol  2.5 mg Nebulization Q6H WA  . cholecalciferol  1,200 Units Oral Daily  . dextromethorphan  30 mg Oral BID  . famotidine  20 mg Oral BID  . [COMPLETED] heparin  1,000 Units Intravenous Once  . [COMPLETED] heparin  1,000 Units Intravenous Once  . losartan  25 mg Oral Daily  . meropenem (MERREM) IV  1 g Intravenous Q12H  . methocarbamol  500 mg Oral BID  . metroNIDAZOLE  500 mg Oral Q8H  . [COMPLETED] potassium chloride  40 mEq Oral Once  . tiotropium  18 mcg Inhalation Daily  . [DISCONTINUED] meropenem (MERREM) IV  500 mg Intravenous Q8H   Infusions:     . sodium chloride    . heparin    . [DISCONTINUED] heparin 700 Units/hr (01/12/12 0947)   PRN: acetaminophen, acetaminophen, albuterol, alum & mag hydroxide-simeth,  HYDROmorphone (DILAUDID) injection, ondansetron (ZOFRAN) IV, ondansetron, [COMPLETED] technetium albumin aggregated, [EXPIRED] technetium TC 55M diethylenetriame-pentaacetic acid, zolpidem  Assessment: . Hep level is subtherapeutic at 850 units/hr; no IV line problem per RN, no bleeding reported.  Confirmed with RN heparin rate infusing at appropriate rate. . VQ Scan indeterminate probability for PE . CBC stable  Goal of Therapy:  Heparin level 0.3-0.7 units/ml Monitor platelets by anticoagulation protocol: Yes   Plan:  . Rebolus with heparin 1000 units IV x 1 then increase heparin rate to 1000 units/hr . Will check 8 hr HL  Laterra Lubinski, Joselyn Glassman, PharmD 01/13/2012,3:07 AM

## 2012-01-13 NOTE — Consult Note (Signed)
PULMONARY  / CRITICAL CARE MEDICINE  Name: Bianca [name] MRN: [id number] DOB: 13-Feb-1930    LOS: 2  REFERRING PROVIDER:  Short  CHIEF COMPLAINT: Questionable PE  BRIEF PATIENT DESCRIPTION: 78 yo remote smoker with chronic bronchitis with VQ scan intermediated for PE>  LINES / TUBES:   CULTURES: 12-8 cdif>>++ 12-7 bcx2>>  ANTIBIOTICS: 12-8 merrem>> 12-8 flagyl>>  SIGNIFICANT EVENTS:    LEVEL OF CARE:  floor PRIMARY SERVICE:  [facility] CONSULTANTS:  pccm CODE STATUS:  full DIET:  reg DVT Px:  heparin GI Px:  pepcid  HISTORY OF PRESENT ILLNESS: 78 yo with SOB/cough x 8 months. Treated as OPT per Dr. Sherene Sires with Bronchodilators, prednisone, abx with improvement but not lasting more than 2 weeks. She is in [facility] on (needed prior to admit not using), co pain starting at head and running down her neck. Admitted and VQ scan was intertmediate for PE. PCCM asked to evaluate. PAST MEDICAL HISTORY :  Past Medical History  Diagnosis Date  . DIVERTICULOSIS, COLON   . VITAMIN D DEFICIENCY   . HYPERLIPIDEMIA   . HYPERTENSION   . OSTEOPOROSIS   . COPD (chronic obstructive pulmonary disease)    Past Surgical History  Procedure Date  . Cystoscopy 07/2005  . Vertebroplasty 03/2005    Dr. Deanne Coffer   Prior to Admission medications   Medication Sig Start Date End Date Taking? Authorizing Provider  albuterol (PROVENTIL HFA;VENTOLIN HFA) 108 (90 BASE) MCG/ACT inhaler Inhale 2 puffs into the lungs every 4 (four) hours as needed for wheezing or shortness of breath. 01/20/11 01/20/12 Yes Vida Roller, MD  benzonatate (TESSALON) 200 MG capsule Take 200 mg by mouth 3 (three) times daily as needed. Cough 12/02/11  Yes Newt Lukes, MD  famotidine (PEPCID) 20 MG tablet One at bedtime 12/05/11  Yes Nyoka Cowden, MD  levofloxacin (LEVAQUIN) 750 MG tablet Take 750 mg by mouth every other day.   Yes Historical Provider, MD  losartan (COZAAR) 50 MG tablet Take 25 mg by mouth daily. 09/06/11  Yes  Newt Lukes, MD  methocarbamol (ROBAXIN) 500 MG tablet Take 500 mg by mouth 2 (two) times daily. 01/06/12  Yes Ankit Rhunette Croft, MD  mometasone-formoterol (DULERA) 100-5 MCG/ACT AERO 2 puffs. Take 2 puffs first thing in am and then another 2 puffs about 12 hours later. 12/05/11  Yes Nyoka Cowden, MD  NON FORMULARY Adult Gummy multivitamin take 2 daily    Yes Historical Provider, MD  Pseudoeph-Doxylamine-DM-APAP (NYQUIL) 60-7.07-03-998 MG/30ML LIQD Take 30 mLs by mouth as needed. Cold symptoms   Yes Historical Provider, MD  tiotropium (SPIRIVA) 18 MCG inhalation capsule Place 18 mcg into inhaler and inhale daily.   Yes Historical Provider, MD  traMADol (ULTRAM) 50 MG tablet Take 50 mg by mouth every 6 (six) hours as needed. Pain   Yes Historical Provider, MD  vitamin D, CHOLECALCIFEROL, 400 UNITS tablet Take 1,200 Units by mouth daily. 09/06/11  Yes Newt Lukes, MD  denosumab (PROLIA) 60 MG/ML SOLN injection Inject 60 mg into the skin every 6 (six) months. Administer in upper arm, thigh, or abdomen. Dr Hoy Register office 04/15/11   Newt Lukes, MD   Allergies  Allergen Reactions  . Aspirin Nausea And Vomiting  . Codeine Hives  . Penicillins Swelling  . Shrimp (Shellfish Allergy) Swelling    FAMILY HISTORY:  Family History  Problem Relation Age of Onset  . Heart disease Mother   . Hypertension Mother   . Heart disease  Father   . Hypertension Father    SOCIAL HISTORY:  reports that she quit smoking about 30 years ago. Her smoking use included Cigarettes. She started smoking about 61 years ago. She has a 9.3 pack-year smoking history. She has never used smokeless tobacco. She reports that she drinks about 1.2 ounces of alcohol per week. She reports that she does not use illicit drugs.  REVIEW OF SYSTEMS:   See hpi  INTERVAL HISTORY:   VITAL SIGNS: Temp:  [98 F (36.7 C)-98.7 F (37.1 C)] 98 F (36.7 C) (12/09 0555) Pulse Rate:  [90-109] 109  (12/09 0555) Resp:   [18-20] 18  (12/09 0555) BP: (107-138)/(60-90) 138/90 mmHg (12/09 0555) SpO2:  [91 %-96 %] 92 % (12/09 0947)  PHYSICAL EXAMINATION: General:  Thin alert WF Neuro:  Intact HEENT:  No LAN, Neck:  No JVD Cardiovascular:  HSR Lungs:  Mild exp wheeze Abdomen:  +bs Musculoskeletal:  Intact wasted Skin:  warm   Lab 01/13/12 0205 01/12/12 0641 01/11/12 2030  NA 135 134* 134*  K 3.2* 3.2* 3.3*  CL 100 98 93*  CO2 26 26 29   BUN 3* 4* 7  CREATININE 0.38* 0.37* 0.46*  GLUCOSE 96 90 102*    Lab 01/13/12 0205 01/12/12 0825 01/12/12 0641  HGB 11.7* 11.9* 12.0  HCT 34.8* 35.8* 35.7*  WBC 14.1* 16.0* 17.2*  PLT 483* 480* 469*   Dg Chest 2 View  01/11/2012  *RADIOLOGY REPORT*  Clinical Data: Cough, shortness of breath and chest congestion.  CHEST - 2 VIEW  Comparison: 01/06/2012.  Findings: Normal sized heart.  Multifocal patchy airspace opacity in both lungs with improvement in the lower lung zones and mild progression in the mid lung zones.  Minimal bilateral pleural fluid with improvement.  Stable lower thoracic vertebral compression deformities and kyphoplasty material.  Stable diffuse peribronchial thickening, accentuation of the interstitial markings and hyperexpansion of the lungs.  Old bilateral rib fractures.  IMPRESSION:  1.  Mild overall improvement in bilateral pneumonia. 2.  Stable changes of COPD and chronic bronchitis.   Original Report Authenticated By: Beckie Salts, M.D.    Nm Pulmonary Perf And Vent  01/12/2012  *RADIOLOGY REPORT*  Clinical Data:  78 year old female with chest pain, shortness of breath and elevated D-dimer.  NUCLEAR MEDICINE VENTILATION - PERFUSION LUNG SCAN  Technique:  Ventilation images were obtained in multiple projections using inhaled aerosol technetium 99 M DTPA.  Perfusion images were obtained in multiple projections after intravenous injection of Tc-42m MAA.  Radiopharmaceuticals:  Tc-22m DTPA aerosol and 5 mCi Tc-11m MAA.  Comparison: 01/11/2012  chest radiograph  Findings:  Ventilation:  This is a suboptimal ventilation study with clumping of agent within the lungs.  Multiple scattered areas of decreased ventilation are noted, some which may be artifactual.  Perfusion:  Multiple scattered bilateral perfusion defects are identified, primarily in the mid and upper lungs.  Many of the perfusion defects appear to match ventilation defects.  IMPRESSION: Indeterminate probability for pulmonary embolus (20-80%).   Original Report Authenticated By: Harmon Pier, M.D.     ASSESSMENT / PLAN:  78 yo former smoker with 8 months of frequent cough, bronchitis , hypoxia and now with VQ ?for PE Plan: -agree with heparin -transition to  Coumadin -consider non contrasted ct chest to evaluate lung dz -O2 as needed(prob needed prior to admit -Head pain sounds like neuralgia associated with non erupting singles?? -wean steroids -BD's for underlying COPD -LEDS ro dvt  -c diff per IM  Brett Canales Minor ACNP [facility] P[facility] Pager [phone] till 3 pm If no answer page [phone] 01/13/2012, 1:22 PM  Patient seen and examined, agree with above note.  I dictated the care and orders written for this patient under my direction.  Koren Bound, M.D. [phone]

## 2012-01-13 NOTE — Progress Notes (Signed)
Patient ID: Bianca [name], female   DOB: 07-Feb-1930, 78 y.o.   MRN: [id number]    Subjective:  Coughing no chest pain  Objective:  Filed Vitals:   01/12/12 1519 01/12/12 2052 01/12/12 2057 01/13/12 0555  BP:   107/60 138/90  Pulse:   90 109  Temp:   98.7 F (37.1 C) 98 F (36.7 C)  TempSrc:   Oral Oral  Resp:   20 18  Height:      Weight:      SpO2: 94% 95% 96% 91%    Intake/Output from previous day:  Intake/Output Summary (Last 24 hours) at 01/13/12 0830 Last data filed at 01/13/12 0600  Gross per 24 hour  Intake   1158 ml  Output    525 ml  Net    633 ml    Physical Exam: Affect appropriate Thin frail female HEENT: normal Neck supple with no adenopathy JVP normal no bruits no thyromegaly Lungs clear with no wheezing and good diaphragmatic motion Heart:  S1/S2 no murmur, no rub, gallop or click PMI normal Abdomen: benighn, BS positve, no tenderness, no AAA no bruit.  No HSM or HJR Distal pulses intact with no bruits No edema Neuro non-focal Skin warm and dry No muscular weakness   Lab Results: Basic Metabolic Panel:  Basename 01/13/12 0205 01/12/12 0641  NA 135 134*  K 3.2* 3.2*  CL 100 98  CO2 26 26  GLUCOSE 96 90  BUN 3* 4*  CREATININE 0.38* 0.37*  CALCIUM 7.4* 7.8*  MG -- --  PHOS -- --   Liver Function Tests:  Basename 01/11/12 2030  AST 31  ALT 17  ALKPHOS 106  BILITOT 0.3  PROT 6.9  ALBUMIN 2.7*   CBC:  Basename 01/13/12 0205 01/12/12 0825 01/11/12 2030  WBC 14.1* 16.0* --  NEUTROABS -- -- 15.7*  HGB 11.7* 11.9* --  HCT 34.8* 35.8* --  MCV 94.8 93.5 --  PLT 483* 480* --   Cardiac Enzymes:  Basename 01/12/12 1210 01/12/12 0641 01/12/12 0040  CKTOTAL 59 63 66  CKMB 5.3* 5.7* 5.9*  CKMBINDEX -- -- --  TROPONINI 0.40* 0.63* 1.41*   D-Dimer:  Basename 01/11/12 2030  DDIMER 1.46*    Imaging: Dg Chest 2 View  01/11/2012  *RADIOLOGY REPORT*  Clinical Data: Cough, shortness of breath and chest congestion.  CHEST - 2  VIEW  Comparison: 01/06/2012.  Findings: Normal sized heart.  Multifocal patchy airspace opacity in both lungs with improvement in the lower lung zones and mild progression in the mid lung zones.  Minimal bilateral pleural fluid with improvement.  Stable lower thoracic vertebral compression deformities and kyphoplasty material.  Stable diffuse peribronchial thickening, accentuation of the interstitial markings and hyperexpansion of the lungs.  Old bilateral rib fractures.  IMPRESSION:  1.  Mild overall improvement in bilateral pneumonia. 2.  Stable changes of COPD and chronic bronchitis.   Original Report Authenticated By: Beckie Salts, M.D.    Nm Pulmonary Perf And Vent  01/12/2012  *RADIOLOGY REPORT*  Clinical Data:  78 year old female with chest pain, shortness of breath and elevated D-dimer.  NUCLEAR MEDICINE VENTILATION - PERFUSION LUNG SCAN  Technique:  Ventilation images were obtained in multiple projections using inhaled aerosol technetium 99 M DTPA.  Perfusion images were obtained in multiple projections after intravenous injection of Tc-40m MAA.  Radiopharmaceuticals:  Tc-73m DTPA aerosol and 5 mCi Tc-14m MAA.  Comparison: 01/11/2012 chest radiograph  Findings:  Ventilation:  This is a suboptimal ventilation  study with clumping of agent within the lungs.  Multiple scattered areas of decreased ventilation are noted, some which may be artifactual.  Perfusion:  Multiple scattered bilateral perfusion defects are identified, primarily in the mid and upper lungs.  Many of the perfusion defects appear to match ventilation defects.  IMPRESSION: Indeterminate probability for pulmonary embolus (20-80%).   Original Report Authenticated By: Harmon Pier, M.D.     Cardiac Studies:  ECG: ST rate 101  Otherwise normal 01/12/12   Telemetry:  SR no VT or PAF 01/13/2012   Echo: pending  Medications:     . [EXPIRED] sodium chloride   Intravenous STAT  . albuterol  2.5 mg Nebulization Q6H WA  .  cholecalciferol  1,200 Units Oral Daily  . dextromethorphan  30 mg Oral BID  . famotidine  20 mg Oral BID  . [COMPLETED] heparin  1,000 Units Intravenous Once  . [COMPLETED] heparin  1,000 Units Intravenous Once  . [COMPLETED] heparin  1,000 Units Intravenous Once  . losartan  25 mg Oral Daily  . meropenem (MERREM) IV  1 g Intravenous Q12H  . methocarbamol  500 mg Oral BID  . metroNIDAZOLE  500 mg Oral Q8H  . [COMPLETED] potassium chloride  40 mEq Oral Once  . potassium chloride  40 mEq Oral Once  . tiotropium  18 mcg Inhalation Daily  . [DISCONTINUED] meropenem (MERREM) IV  500 mg Intravenous Q8H       . sodium chloride    . heparin 1,000 Units/hr (01/13/12 0344)  . [DISCONTINUED] heparin 700 Units/hr (01/12/12 0947)  . [DISCONTINUED] heparin      Assessment/Plan:  Elevated Troponin:  No clincial syndrome of CAD.  Pleuritic pain from cough resolved  ECG normal Echo pending Does not appear to be a need for further w/u Pulm:  Bilateral pneumonia V/Q indeterminant due to multiple ventialtion defects from pneumonia and COPD This is her main clinical issue Cdiff:  From recent antibiotics Rx per primary service  Charlton Haws 01/13/2012, 8:30 AM

## 2012-01-13 NOTE — Progress Notes (Signed)
Lower extremity venous duplex has been completed.  Preliminary Report= Bilateral:  No evidence of DVT, superficial thrombosis, or Baker's Cyst.  Farrel Demark, RDMS, RVT 01/13/2012

## 2012-01-13 NOTE — Progress Notes (Signed)
INITIAL ADULT NUTRITION ASSESSMENT Date: 01/13/2012   Time: 4:25 PM Reason for Assessment: Low BMI  INTERVENTION: Pt not interested in nutritional supplements at this time. Encouraged gradually increasing intake. RD to monitor.  Pt meets criteria for moderate malnutrition of chronic illness AEB pt with visible mild/moderate muscle wasting and fat loss in arms, clavicles, and shoulders.   ASSESSMENT: Female 78 y.o.  Dx: Healthcare-associated pneumonia   Food/Nutrition Related Hx: Pt reports eating 3 small meals/day PTA with stable weight of between 76-77 pounds for the past 2 months. Pt reports 3-4 episodes of diarrhea/day for the past 4 months r/t being on multiple medications. Pt reports she was drinking Boost and Gatorade at home in addition to drinking a lot of water.   Hx:  Past Medical History  Diagnosis Date  . DIVERTICULOSIS, COLON   . VITAMIN D DEFICIENCY   . HYPERLIPIDEMIA   . HYPERTENSION   . OSTEOPOROSIS   . COPD (chronic obstructive pulmonary disease)    Related Meds:  Scheduled Meds:   . [EXPIRED] sodium chloride   Intravenous STAT  . albuterol  2.5 mg Nebulization Q6H WA  . budesonide  0.5 mg Nebulization BID  . cholecalciferol  1,200 Units Oral Daily  . dextromethorphan-guaiFENesin  1 tablet Oral BID  . famotidine  20 mg Oral BID  . [COMPLETED] heparin  1,000 Units Intravenous Once  . [COMPLETED] heparin  1,000 Units Intravenous Once  . losartan  25 mg Oral Daily  . meropenem (MERREM) IV  1 g Intravenous Q12H  . methocarbamol  500 mg Oral BID  . methylPREDNISolone (SOLU-MEDROL) injection  40 mg Intravenous Q12H  . metroNIDAZOLE  500 mg Oral Q8H  . [COMPLETED] potassium chloride  40 mEq Oral Once  . tiotropium  18 mcg Inhalation Daily  . [DISCONTINUED] dextromethorphan  30 mg Oral BID  . [DISCONTINUED] potassium chloride  40 mEq Oral Once   Continuous Infusions:   . heparin 1,000 Units/hr (01/13/12 0344)  . [DISCONTINUED] sodium chloride    .  [DISCONTINUED] heparin 700 Units/hr (01/12/12 0947)  . [DISCONTINUED] heparin     PRN Meds:.acetaminophen, acetaminophen, albuterol, alum & mag hydroxide-simeth, benzonatate, HYDROmorphone (DILAUDID) injection, ondansetron (ZOFRAN) IV, ondansetron, [EXPIRED] technetium TC 63M diethylenetriame-pentaacetic acid, zolpidem  Ht: 5' (152.4 cm)  Wt: 76 lb 15.1 oz (34.9 kg)  Ideal Wt: 100 lb % Ideal Wt: 76  Usual Wt: 77 lb % Usual Wt: 99  Wt Readings from Last 10 Encounters:  01/12/12 76 lb 15.1 oz (34.9 kg)  01/06/12 75 lb (34.02 kg)  12/17/11 76 lb (34.473 kg)  12/05/11 77 lb 3.2 oz (35.018 kg)  10/25/11 79 lb 8 oz (36.061 kg)  09/30/11 82 lb 1.9 oz (37.249 kg)  09/06/11 83 lb 6 oz (37.819 kg)  02/06/11 84 lb (38.102 kg)  01/23/11 88 lb 12.8 oz (40.279 kg)  07/03/10 91 lb 3.2 oz (41.368 kg)    Body mass index is 15.03 kg/(m^2).  Labs:  CMP     Component Value Date/Time   NA 135 01/13/2012 0205   K 3.2* 01/13/2012 0205   CL 100 01/13/2012 0205   CO2 26 01/13/2012 0205   GLUCOSE 96 01/13/2012 0205   BUN 3* 01/13/2012 0205   CREATININE 0.38* 01/13/2012 0205   CALCIUM 7.4* 01/13/2012 0205   PROT 6.9 01/11/2012 2030   ALBUMIN 2.7* 01/11/2012 2030   AST 31 01/11/2012 2030   ALT 17 01/11/2012 2030   ALKPHOS 106 01/11/2012 2030   BILITOT 0.3 01/11/2012 2030  GFRNONAA >90 01/13/2012 0205   GFRAA >90 01/13/2012 0205    Intake/Output Summary (Last 24 hours) at 01/13/12 1633 Last data filed at 01/13/12 1555  Gross per 24 hour  Intake   1238 ml  Output    675 ml  Net    563 ml   Last BM - 12/9  Diet Order: Cardiac   IVF:    heparin Last Rate: 1,000 Units/hr (01/13/12 0344)  [DISCONTINUED] sodium chloride   [DISCONTINUED] heparin Last Rate: 700 Units/hr (01/12/12 0947)  [DISCONTINUED] heparin     Estimated Nutritional Needs:   Kcal:1200-1400 Protein:55-70g Fluid:1.2-1.4L  NUTRITION DIAGNOSIS: -Altered GI function (NI-1.4).  Status: Ongoing  RELATED TO: 3-4 episodes of  diarrhea/day for the past few months  AS EVIDENCE BY: pt statement  MONITORING/EVALUATION(Goals): 1. Resolution of diarrhea 2. Pt to consume >90% of meals  EDUCATION NEEDS: -No education needs identified at this time    Dietitian #: [phone]  DOCUMENTATION CODES Per approved criteria  -Non-severe (moderate) malnutrition in the context of chronic illness -Underweight    Marshall Cork 01/13/2012, 4:25 PM

## 2012-01-13 NOTE — Progress Notes (Signed)
Brief Pharmacy Note: Heparin infusion  Heparin level 0.53 units/ml on 1000 units/hr. Level in range (0.3-0.7 units/ml)  Patient has required Heparin rebolus x 2 with rate increases. Will recheck level in 8 hr to be certain heparin remains in range.  Thank you, Otho Bellows PharmD Pager [phone] 01/13/2012 1:06 PM

## 2012-01-13 NOTE — Progress Notes (Signed)
   CARE MANAGEMENT NOTE 01/13/2012  Patient:  Bianca [name], Bianca [name]   Account Number:  [id number]  Date Initiated:  01/13/2012  Documentation initiated by:  Jiles Crocker  Subjective/Objective Assessment:   ADMITTED WITH PNEUMONIA VS PE     Action/Plan:   PCP: [name],  LIVES AT HOME WITH SPOUSE   Anticipated DC Date:  01/20/2012   Anticipated DC Plan:  HOME/SELF CARE      DC Planning Services  CM consult        Status of service:  In process, will continue to follow Medicare Important Message given?  NA - LOS <3 / Initial given by admissions (If response is "NO", the following Medicare IM given date fields will be blank)  Per UR Regulation:  Reviewed for med. necessity/level of care/duration of stay  Comments:  01/13/2012- B Galan Ghee RN,BSN,MHA

## 2012-01-13 NOTE — Progress Notes (Addendum)
ANTICOAGULATION CONSULT NOTE - Initial Consult  Pharmacy Consult for Transition to Lovenox and Warfarin Indication: Possible PE  Allergies  Allergen Reactions  . Aspirin Nausea And Vomiting  . Codeine Hives  . Penicillins Swelling  . Shrimp (Shellfish Allergy) Swelling    Patient Measurements: Height: 5' (152.4 cm) Weight: 76 lb 15.1 oz (34.9 kg) IBW/kg (Calculated) : 45.5   Vital Signs: Temp: 98.6 F (37 C) (12/09 1554) Temp src: Oral (12/09 1554) BP: 91/72 mmHg (12/09 1554) Pulse Rate: 114  (12/09 1554)  Labs:  Basename 01/13/12 1135 01/13/12 0205 01/12/12 1820 01/12/12 1210 01/12/12 0825 01/12/12 0641 01/12/12 0040 01/11/12 2030  HGB -- 11.7* -- -- 11.9* -- -- --  HCT -- 34.8* -- -- 35.8* 35.7* -- --  PLT -- 483* -- -- 480* 469* -- --  APTT -- -- -- -- -- -- 60* --  LABPROT -- -- -- -- -- -- 14.9 --  INR -- -- -- -- -- -- 1.19 --  HEPARINUNFRC 0.53 0.18* 0.12* -- -- -- -- --  CREATININE -- 0.38* -- -- -- 0.37* -- 0.46*  CKTOTAL -- -- -- 59 -- 63 66 --  CKMB -- -- -- 5.3* -- 5.7* 5.9* --  TROPONINI -- -- -- 0.40* -- 0.63* 1.41* --    Estimated Creatinine Clearance: 31.4 ml/min (by C-G formula based on Cr of 0.38).   Medical History: Past Medical History  Diagnosis Date  . DIVERTICULOSIS, COLON   . VITAMIN D DEFICIENCY   . HYPERLIPIDEMIA   . HYPERTENSION   . OSTEOPOROSIS   . COPD (chronic obstructive pulmonary disease)     Medications:  Scheduled:    . [EXPIRED] sodium chloride   Intravenous STAT  . albuterol  2.5 mg Nebulization Q6H WA  . budesonide  0.5 mg Nebulization BID  . cholecalciferol  1,200 Units Oral Daily  . dextromethorphan-guaiFENesin  1 tablet Oral BID  . famotidine  20 mg Oral BID  . [COMPLETED] heparin  1,000 Units Intravenous Once  . [COMPLETED] heparin  1,000 Units Intravenous Once  . losartan  25 mg Oral Daily  . meropenem (MERREM) IV  1 g Intravenous Q12H  . methocarbamol  500 mg Oral BID  . methylPREDNISolone (SOLU-MEDROL)  injection  40 mg Intravenous Q12H  . metroNIDAZOLE  500 mg Oral Q8H  . [COMPLETED] potassium chloride  40 mEq Oral Once  . tiotropium  18 mcg Inhalation Daily  . [DISCONTINUED] dextromethorphan  30 mg Oral BID  . [DISCONTINUED] potassium chloride  40 mEq Oral Once   Infusions:    . heparin 1,000 Units/hr (01/13/12 0344)  . [DISCONTINUED] sodium chloride    . [DISCONTINUED] heparin      Assessment: 78 yo F with V scan intermediate for PE, currently on IV heparin, now to switch to Lovenox and warfarin. Baseline INR = 1.19. Plan will be to stop heparin drip, then 1 hour later, begin SQ lovenox. Will give Lovenox 1mg /kg x1 tonight, then 12 hours later start 1.5mg /kg q24h dosing (to avoid patient having to get the shot at 10pm every night). Due to low weight (35 kg - confirmed with RN) will start with low inital warfarin dose. Preliminary LE venous duplex negative for DVT.  Goal of Therapy:  INR 2-3 Monitor platelets by anticoagulation protocol: Yes LMWH level 1-2 units/ml (for 1.5mg /kg dosing)   Plan:  1) At 9pm - stop IV heparin infusion 2) At 10pm - give Lovenox 1mg /kg SQ x1 (35mg ), then at 10am tomorrow, start Lovenox  1.5mg /kg SQ q24h (50mg ). 3) Warfarin 4mg  PO x1 at 22:00 tonight 4) Daily INR 5) Change CBCs to q72h 6) Will plan to check a 4 hour LMWH level at steady state due to low weight (35kg). 7) Warfarin education prior to discharge.  Darrol Angel, PharmD Pager: [phone] 01/13/2012,7:51 PM   ADDENDUM: Called by Dr. Malachi Bonds and informed patient does not want to be switched to SQ Lovenox and PO warfarin at this time and is requesting to remain on IV heparin for now. MD requesting to switch back to IV heparin for now, and will reattempt discussion with patient in am about long-term anticoagulation options. Spoke with RN - IV heparin drip had not yet been stopped, Lovenox and warfarin have not been given yet.  New Plan: 1) Continue IV heparin at previous rate 1000 units/hr  (88ml/hr) 2) Reorder HL to confirm therapeutic rate (therapeutic level obtained at 11:30am today) 3) D/C Lovenox and warfarin order (already done by MD) 4) Reorder daily HL and CBC 5) F/U long-term anticoag plans in am.  Darrol Angel, PharmD Pager: [phone] 01/13/2012 9:30 PM

## 2012-01-13 NOTE — Progress Notes (Signed)
  Echocardiogram 2D Echocardiogram has been performed.  Cathie Beams 01/13/2012, 12:57 PM

## 2012-01-13 NOTE — Progress Notes (Signed)
01-12-12  Lab reports pt is positive for c-diff

## 2012-01-13 NOTE — Progress Notes (Signed)
[facility] PROGRESS NOTE  Bianca [name]:[id number] DOB: [birth year] DOA: 01/11/2012 PCP: [name], MD  Assessment/Plan:  SOB:  DDX includes HCAP, ACS, PE, COPD exacerbation:  Healthcare-associated pneumonia:  Acute respiratory failure with hypoxia -  Continue meropenem day 3 -  BCx2 12/7 NGTD -  Sputum culture pending collection -  Legionella Ag neg -  Step pneumo Ag neg -  Continue oxygen prn, oxygen saturations in high 80s to low 90s on 2L [place] -  mucinex DM and tessalon  Chest pain:  Most likely related to pneumonia versus PE.  Despite  Elevated troponin and subtle changes to ECG.  Per cardiology, low suspicion for ACS, however, it troponins continue to rise, will need to start BB and statin.  VQ scan moderate probability.  Will consult pulmonology for further recommendations regarding pneumonia, COPD, and plans for anticoagulation for possible PE.  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   -  Appreciate cardiology assistance -  Continue telemetry -  Continue heparin as VQ equivocal  -  ECHO pending  COPD exacerbation, diminished with high pitched full expiratory wheeze today -  Albuterol scheduled and prn -  Spiriva  -  Start steroids and budesonide  Also: C diff diarrhea:  Although WBC elevated, most likely this is due to pneumonia and creatinine is not elevated and patient only having 2-5 BM per day -  Flagyl day 2  Hypertension:   Blood pressures stable.  Continue losartan Leukocytosis, trending down on antibiotics, trend WBC Acute normocytic anemia, likely due to IVF and marrow suppression from acute infection, hgb stable Thrombocytosis, likely elevated as acute phase reactant and stable.  Trend  Hyponatremia, mild:  May have some SIADH 2/2 pneumonia versus dehydration from insensible losses and resolved with hydration.   Hypokalemia, likely due to dehydration and medication side effect.   Replete potassium *patient prefers oral solution.  DIET:  Healthy heart diet ACCESS:  PIV IVF:  OFF PROPH:  Heparin gtt pending recommendations regarding pulmonary embolism  Code Status: Full code Family Communication: spoke to patient who was alone at bedside Disposition Plan: pending improvement in respiratory status, further work up of elevated troponin and rule out PE   Consultants:  Cardiology  Procedures:  V/Q 12/8  ECHO pending  CXR  Antibiotics:  Vancomycin x 1 12/7  Meropenem 12/7 >>   HPI/Subjective:  Has some shortness of breath and cough that sounds rhonchorous but which is nonproductive.  She has had several episodes of stool incontinence and her diarrhea is like coffee with some mucous.  Denies fevers, chills, nausea, vomiting.    Objective: Filed Vitals:   01/12/12 2052 01/12/12 2057 01/13/12 0555 01/13/12 0947  BP:  107/60 138/90   Pulse:  90 109   Temp:  98.7 F (37.1 C) 98 F (36.7 C)   TempSrc:  Oral Oral   Resp:  20 18   Height:      Weight:      SpO2: 95% 96% 91% 92%    Intake/Output Summary (Last 24 hours) at 01/13/12 1224 Last data filed at 01/13/12 0600  Gross per 24 hour  Intake   1158 ml  Output    275 ml  Net    883 ml   Filed Weights   01/11/12 1940 01/12/12 0100  Weight: 34.473 kg (76 lb) 34.9 kg (76 lb 15.1 oz)    Exam:   General:  Thin CF,  no acute distress, lying in bed with nasal canula in place  HEENT:  Dry lips and tongue moist after siipping water  Cardiovascular: tachycardic, regular rhythm, no murmurs, rubs, or gallops, 2+ pulses  Respiratory: Bronchial BS and rales bilaterally, today with diminished BS and some wheezes  Abdomen: NABS, soft, nondistended, nontender  MSK:  No LEE  Data Reviewed: Basic Metabolic Panel:  Lab 01/13/12 1610 01/12/12 0641 01/11/12 2030  NA 135 134* 134*  K 3.2* 3.2* 3.3*  CL 100 98 93*  CO2 26 26 29   GLUCOSE 96 90 102*  BUN 3* 4* 7  CREATININE 0.38* 0.37* 0.46*   CALCIUM 7.4* 7.8* 9.0  MG -- -- --  PHOS -- -- --   Liver Function Tests:  Lab 01/11/12 2030  AST 31  ALT 17  ALKPHOS 106  BILITOT 0.3  PROT 6.9  ALBUMIN 2.7*   No results found for this basename: LIPASE:5,AMYLASE:5 in the last 168 hours No results found for this basename: AMMONIA:5 in the last 168 hours CBC:  Lab 01/13/12 0205 01/12/12 0825 01/12/12 0641 01/11/12 2030  WBC 14.1* 16.0* 17.2* 19.2*  NEUTROABS -- -- -- 15.7*  HGB 11.7* 11.9* 12.0 13.3  HCT 34.8* 35.8* 35.7* 40.0  MCV 94.8 93.5 93.5 93.2  PLT 483* 480* 469* 513*   Cardiac Enzymes:  Lab 01/12/12 1210 01/12/12 0641 01/12/12 0040  CKTOTAL 59 63 66  CKMB 5.3* 5.7* 5.9*  CKMBINDEX -- -- --  TROPONINI 0.40* 0.63* 1.41*   BNP (last 3 results) No results found for this basename: PROBNP:3 in the last 8760 hours CBG: No results found for this basename: GLUCAP:5 in the last 168 hours  Recent Results (from the past 240 hour(s))  CULTURE, BLOOD (ROUTINE X 2)     Status: Normal (Preliminary result)   Collection Time   01/11/12  8:30 PM      Component Value Range Status Comment   Specimen Description BLOOD RIGHT ARM   Final    Special Requests BOTTLES DRAWN AEROBIC AND ANAEROBIC    Final    Culture  Setup Time 01/12/2012 15:26   Final    Culture     Final    Value:        BLOOD CULTURE RECEIVED NO GROWTH TO DATE CULTURE WILL BE HELD FOR 5 DAYS BEFORE ISSUING A FINAL NEGATIVE REPORT   Report Status PENDING   Incomplete   CULTURE, BLOOD (ROUTINE X 2)     Status: Normal (Preliminary result)   Collection Time   01/11/12  9:18 PM      Component Value Range Status Comment   Specimen Description BLOOD LEFT ANTECUBITAL   Final    Special Requests BOTTLES DRAWN AEROBIC AND ANAEROBIC 4CC EACH   Final    Culture  Setup Time 01/12/2012 15:26   Final    Culture     Final    Value:        BLOOD CULTURE RECEIVED NO GROWTH TO DATE CULTURE WILL BE HELD FOR 5 DAYS BEFORE ISSUING A FINAL NEGATIVE REPORT   Report Status  PENDING   Incomplete   CLOSTRIDIUM DIFFICILE BY PCR     Status: Abnormal   Collection Time   01/12/12 12:56 PM      Component Value Range Status Comment   C difficile by pcr POSITIVE (*) NEGATIVE Final      Studies: Dg Chest 2 View  01/11/2012  *RADIOLOGY REPORT*  Clinical Data: Cough, shortness of breath and chest  congestion.  CHEST - 2 VIEW  Comparison: 01/06/2012.  Findings: Normal sized heart.  Multifocal patchy airspace opacity in both lungs with improvement in the lower lung zones and mild progression in the mid lung zones.  Minimal bilateral pleural fluid with improvement.  Stable lower thoracic vertebral compression deformities and kyphoplasty material.  Stable diffuse peribronchial thickening, accentuation of the interstitial markings and hyperexpansion of the lungs.  Old bilateral rib fractures.  IMPRESSION:  1.  Mild overall improvement in bilateral pneumonia. 2.  Stable changes of COPD and chronic bronchitis.   Original Report Authenticated By: Beckie Salts, M.D.    Nm Pulmonary Perf And Vent  01/12/2012  *RADIOLOGY REPORT*  Clinical Data:  78 year old female with chest pain, shortness of breath and elevated D-dimer.  NUCLEAR MEDICINE VENTILATION - PERFUSION LUNG SCAN  Technique:  Ventilation images were obtained in multiple projections using inhaled aerosol technetium 99 M DTPA.  Perfusion images were obtained in multiple projections after intravenous injection of Tc-60m MAA.  Radiopharmaceuticals:  Tc-52m DTPA aerosol and 5 mCi Tc-38m MAA.  Comparison: 01/11/2012 chest radiograph  Findings:  Ventilation:  This is a suboptimal ventilation study with clumping of agent within the lungs.  Multiple scattered areas of decreased ventilation are noted, some which may be artifactual.  Perfusion:  Multiple scattered bilateral perfusion defects are identified, primarily in the mid and upper lungs.  Many of the perfusion defects appear to match ventilation defects.  IMPRESSION: Indeterminate  probability for pulmonary embolus (20-80%).   Original Report Authenticated By: Harmon Pier, M.D.     Scheduled Meds:    . [EXPIRED] sodium chloride   Intravenous STAT  . albuterol  2.5 mg Nebulization Q6H WA  . cholecalciferol  1,200 Units Oral Daily  . dextromethorphan  30 mg Oral BID  . famotidine  20 mg Oral BID  . [COMPLETED] heparin  1,000 Units Intravenous Once  . [COMPLETED] heparin  1,000 Units Intravenous Once  . losartan  25 mg Oral Daily  . meropenem (MERREM) IV  1 g Intravenous Q12H  . methocarbamol  500 mg Oral BID  . metroNIDAZOLE  500 mg Oral Q8H  . potassium chloride  40 mEq Oral Once  . tiotropium  18 mcg Inhalation Daily  . [DISCONTINUED] potassium chloride  40 mEq Oral Once   Continuous Infusions:    . sodium chloride    . heparin 1,000 Units/hr (01/13/12 0344)  . [DISCONTINUED] heparin 700 Units/hr (01/12/12 0947)  . [DISCONTINUED] heparin      Principal Problem:  *Healthcare-associated pneumonia Active Problems:  HYPERTENSION  COPD (chronic obstructive pulmonary disease)  COPD exacerbation  Chronic respiratory failure  Pleuritic chest pain  SOB (shortness of breath)  Elevated troponin  Diarrhea  C. difficile colitis    Time spent: 30    Alyric Parkin, [facility]  [facility] Pager [phone]. If 8PM-8AM, please contact night-coverage at [email or web address], password [facility] 01/13/2012, 12:24 PM  LOS: 2 days

## 2012-01-14 LAB — HEPARIN LEVEL (UNFRACTIONATED): Heparin Unfractionated: 0.84 IU/mL — ABNORMAL HIGH (ref 0.30–0.70)

## 2012-01-14 LAB — BASIC METABOLIC PANEL
BUN: 4 mg/dL — ABNORMAL LOW (ref 6–23)
CO2: 26 mEq/L (ref 19–32)
Calcium: 7.6 mg/dL — ABNORMAL LOW (ref 8.4–10.5)
Creatinine, Ser: 0.32 mg/dL — ABNORMAL LOW (ref 0.50–1.10)
GFR calc Af Amer: >90 mL/min (ref >90)

## 2012-01-14 LAB — CBC
HCT: 37.1 % (ref 36.0–46.0)
MCH: 31.3 pg (ref 26.0–34.0)
MCHC: 33.2 g/dL (ref 30.0–36.0)
RDW: 13.5 % (ref 11.5–15.5)

## 2012-01-14 MED ORDER — ENOXAPARIN SODIUM 60 MG/0.6ML ~~LOC~~ SOLN
50.0000 mg | Freq: Every day | SUBCUTANEOUS | Status: DC
  Filled 2012-01-14: qty 0.6

## 2012-01-14 MED ORDER — HEPARIN (PORCINE) IN NACL 100-0.45 UNIT/ML-% IJ SOLN
1100.0000 [IU]/h | INTRAMUSCULAR | Status: DC
  Administered 2012-01-14: 1100 [IU]/h via INTRAVENOUS
  Filled 2012-01-14: qty 250

## 2012-01-14 MED ORDER — HEPARIN (PORCINE) IN NACL 100-0.45 UNIT/ML-% IJ SOLN
1000.0000 [IU]/h | INTRAMUSCULAR | Status: DC
  Filled 2012-01-14: qty 250

## 2012-01-14 MED ORDER — WARFARIN SODIUM 4 MG PO TABS
4.0000 mg | ORAL_TABLET | Freq: Once | ORAL | Status: AC
  Administered 2012-01-14: 4 mg via ORAL
  Filled 2012-01-14: qty 1

## 2012-01-14 MED ORDER — ENOXAPARIN SODIUM 40 MG/0.4ML ~~LOC~~ SOLN
35.0000 mg | Freq: Once | SUBCUTANEOUS | Status: AC
  Administered 2012-01-14: 35 mg via SUBCUTANEOUS
  Filled 2012-01-14: qty 0.4

## 2012-01-14 MED ORDER — WARFARIN SODIUM 6 MG PO TABS: 6.0000 mg | ORAL_TABLET | Freq: Once | ORAL | Status: DC

## 2012-01-14 MED ORDER — WARFARIN VIDEO
Freq: Once | Status: AC
  Administered 2012-01-15: 16:00:00

## 2012-01-14 MED ORDER — PATIENT'S GUIDE TO USING COUMADIN BOOK
Freq: Once | Status: AC
  Administered 2012-01-14: 16:00:00
  Filled 2012-01-14: qty 1

## 2012-01-14 MED ORDER — PREDNISONE 50 MG PO TABS
60.0000 mg | ORAL_TABLET | Freq: Every day | ORAL | Status: DC
  Administered 2012-01-15: 60 mg via ORAL
  Filled 2012-01-14 (×2): qty 1

## 2012-01-14 MED ORDER — WARFARIN - PHARMACIST DOSING INPATIENT: Freq: Every day | Status: DC

## 2012-01-14 MED ORDER — FUROSEMIDE 20 MG PO TABS
20.0000 mg | ORAL_TABLET | Freq: Every day | ORAL | Status: DC
  Administered 2012-01-14 – 2012-01-17 (×4): 20 mg via ORAL
  Filled 2012-01-14 (×4): qty 1

## 2012-01-14 NOTE — Progress Notes (Addendum)
[facility] PROGRESS NOTE  AHLIYA GLATT ZOX:[id number] DOB: 1930/11/07 DOA: 01/11/2012 PCP: [name], MD  Assessment/Plan:  SOB:  DDX includes HCAP, ACS, PE, COPD exacerbation:  Healthcare-associated pneumonia:  Acute respiratory failure with hypoxia.   -  Continue meropenem day 4 -  BCx2 12/7 NGTD -  Sputum culture pending collection -  Legionella Ag neg -  Step pneumo Ag neg -  Continue oxygen prn, oxygen saturations in high 80s to low 90s on 2L [place] -  mucinex DM and tessalon discontinued because patient concerned that they gave her diarrhea  Chest pain:  Most likely related to pneumonia versus PE per cardiology. She had elevated troponins at admission and has evidence of wall motion abnormality on her ECHO with systolic heart failure which is new.  - Appreciate cardiology recommendations  - Patient does not want CT scan of chest to rule out PE because she has severe shrimp allergy and her allergist told her to never have IV contrast because it could kill her.    Presumptive PE:  -  Transition to coumadin and lovenox  CHF:  Possible ICM versus takasubo cardiomyopathy.  EF of 35-40% -  No further evaluation per cardiology -  Appreciate cardiology assistance -  Patient on lasix and ARB.  No BB due to COPD  COPD exacerbation, diminished with high pitched full expiratory wheeze today -  Albuterol scheduled and prn -  Spiriva  -  Decrease steroids today -  Continue budesonide  Also: C diff diarrhea:  Resolving -  Flagyl day 3  Hypertension:   Blood pressures stable.  Continue losartan Leukocytosis, trending down on antibiotics, trend WBC Acute normocytic anemia, likely due to IVF and marrow suppression from acute infection, hgb stable Thrombocytosis, likely elevated as acute phase reactant and stable.  Trend  Hypokalemia, resolved  *patient prefers oral solution KCl  DIET:  Healthy heart diet ACCESS:  PIV IVF:  OFF PROPH:  Heparin gtt pending  recommendations regarding pulmonary embolism  Code Status: Full code Family Communication: spoke to patient who was alone at bedside Disposition Plan:  pending improvement in respiratory status and further antibiotics   Consultants:  Cardiology  Pulmonary critical care  Procedures:  V/Q 12/8  ECHO   CXR  Antibiotics:  Vancomycin x 1 12/7  Meropenem 12/7 >>   HPI/Subjective:  Has some shortness of breath and cough that sounds rhonchorous but which is nonproductive.  Stools are less frequent and less watery.  Denies fevers, chills, nausea, vomiting.    Objective: Filed Vitals:   01/14/12 0606 01/14/12 0751 01/14/12 0902 01/14/12 0906  BP: 133/61     Pulse: 105     Temp: 97.1 F (36.2 C)     TempSrc: Oral     Resp: 18     Height:      Weight:      SpO2: 92% 94% 88% 90%    Intake/Output Summary (Last 24 hours) at 01/14/12 1342 Last data filed at 01/14/12 0700  Gross per 24 hour  Intake  326.5 ml  Output      0 ml  Net  326.5 ml   Filed Weights   01/11/12 1940 01/12/12 0100  Weight: 34.473 kg (76 lb) 34.9 kg (76 lb 15.1 oz)    Exam:   General:  Thin CF, no acute distress, lying in bed with nasal canula in place  HEENT:  Dry lips and tongue moist after siipping water  Cardiovascular: tachycardic, regular rhythm, no murmurs, rubs, or  gallops, 2+ pulses  Respiratory: Bronchial BS and diminished bilaterally with wheezes  Abdomen: NABS, soft, nondistended, nontender  MSK:  No LEE  Data Reviewed: Basic Metabolic Panel:  Lab 01/14/12 4098 01/13/12 0205 01/12/12 0641 01/11/12 2030  NA 135 135 134* 134*  K 4.1 3.2* 3.2* 3.3*  CL 101 100 98 93*  CO2 26 26 26 29   GLUCOSE 140* 96 90 102*  BUN 4* 3* 4* 7  CREATININE 0.32* 0.38* 0.37* 0.46*  CALCIUM 7.6* 7.4* 7.8* 9.0  MG -- -- -- --  PHOS -- -- -- --   Liver Function Tests:  Lab 01/11/12 2030  AST 31  ALT 17  ALKPHOS 106  BILITOT 0.3  PROT 6.9  ALBUMIN 2.7*   No results found for this  basename: LIPASE:5,AMYLASE:5 in the last 168 hours No results found for this basename: AMMONIA:5 in the last 168 hours CBC:  Lab 01/14/12 0503 01/13/12 0205 01/12/12 0825 01/12/12 0641 01/11/12 2030  WBC 14.1* 14.1* 16.0* 17.2* 19.2*  NEUTROABS -- -- -- -- 15.7*  HGB 12.3 11.7* 11.9* 12.0 13.3  HCT 37.1 34.8* 35.8* 35.7* 40.0  MCV 94.4 94.8 93.5 93.5 93.2  PLT 523* 483* 480* 469* 513*   Cardiac Enzymes:  Lab 01/12/12 1210 01/12/12 0641 01/12/12 0040  CKTOTAL 59 63 66  CKMB 5.3* 5.7* 5.9*  CKMBINDEX -- -- --  TROPONINI 0.40* 0.63* 1.41*   BNP (last 3 results)  Basename 01/13/12 1942  PROBNP 2922.0*   CBG: No results found for this basename: GLUCAP:5 in the last 168 hours  Recent Results (from the past 240 hour(s))  CULTURE, BLOOD (ROUTINE X 2)     Status: Normal (Preliminary result)   Collection Time   01/11/12  8:30 PM      Component Value Range Status Comment   Specimen Description BLOOD RIGHT ARM   Final    Special Requests BOTTLES DRAWN AEROBIC AND ANAEROBIC    Final    Culture  Setup Time 01/12/2012 15:26   Final    Culture     Final    Value:        BLOOD CULTURE RECEIVED NO GROWTH TO DATE CULTURE WILL BE HELD FOR 5 DAYS BEFORE ISSUING A FINAL NEGATIVE REPORT   Report Status PENDING   Incomplete   CULTURE, BLOOD (ROUTINE X 2)     Status: Normal (Preliminary result)   Collection Time   01/11/12  9:18 PM      Component Value Range Status Comment   Specimen Description BLOOD LEFT ANTECUBITAL   Final    Special Requests BOTTLES DRAWN AEROBIC AND ANAEROBIC 4CC EACH   Final    Culture  Setup Time 01/12/2012 15:26   Final    Culture     Final    Value:        BLOOD CULTURE RECEIVED NO GROWTH TO DATE CULTURE WILL BE HELD FOR 5 DAYS BEFORE ISSUING A FINAL NEGATIVE REPORT   Report Status PENDING   Incomplete   CLOSTRIDIUM DIFFICILE BY PCR     Status: Abnormal   Collection Time   01/12/12 12:56 PM      Component Value Range Status Comment   C difficile by pcr POSITIVE  (*) NEGATIVE Final      Studies: No results found.  Scheduled Meds:    . albuterol  2.5 mg Nebulization Q6H WA  . budesonide  0.5 mg Nebulization BID  . cholecalciferol  1,200 Units Oral Daily  . famotidine  20 mg Oral BID  . furosemide  20 mg Oral Daily  . losartan  25 mg Oral Daily  . meropenem (MERREM) IV  1 g Intravenous Q12H  . methocarbamol  500 mg Oral BID  . methylPREDNISolone (SOLU-MEDROL) injection  40 mg Intravenous Q12H  . metroNIDAZOLE  500 mg Oral Q8H  . tiotropium  18 mcg Inhalation Daily  . [DISCONTINUED] dextromethorphan-guaiFENesin  1 tablet Oral BID  . [DISCONTINUED] enoxaparin (LOVENOX) injection  1 mg/kg Subcutaneous Once  . [DISCONTINUED] enoxaparin (LOVENOX) injection  1.5 mg/kg Subcutaneous Q24H  . [DISCONTINUED] enoxaparin (LOVENOX) injection  1.5 mg/kg Subcutaneous Q24H  . [DISCONTINUED] warfarin  4 mg Oral ONCE-1800  . [DISCONTINUED] warfarin  4 mg Oral ONCE-1800  . [DISCONTINUED] Warfarin - Pharmacist Dosing Inpatient   Does not apply q1800   Continuous Infusions:    . [EXPIRED] heparin 1,000 Units/hr (01/13/12 0344)  . heparin 1,000 Units/hr (01/14/12 1118)  . [DISCONTINUED] heparin 1,000 Units/hr (01/13/12 2115)  . [DISCONTINUED] heparin 1,100 Units/hr (01/14/12 0547)    Principal Problem:  *Healthcare-associated pneumonia Active Problems:  HYPERTENSION  COPD (chronic obstructive pulmonary disease)  COPD exacerbation  Chronic respiratory failure  Pleuritic chest pain  SOB (shortness of breath)  Elevated troponin  Diarrhea  C. difficile colitis  PE (pulmonary embolism)    Time spent: 30    Peder Allums, [facility]  [facility] Pager [phone]. If 8PM-8AM, please contact night-coverage at [email or web address], password [facility] 01/14/2012, 1:42 PM  LOS: 3 days

## 2012-01-14 NOTE — Progress Notes (Signed)
O2 sat 88% on 3L [place], increased to 4L O2 sat increased to 90%, continuing to monitor closely

## 2012-01-14 NOTE — Progress Notes (Addendum)
PULMONARY  / CRITICAL CARE MEDICINE  Name: MASSIE COGLIANO MRN: [id number] DOB: [birth year]    LOS: 3  REFERRING PROVIDER:  Short  CHIEF COMPLAINT: Questionable PE  BRIEF PATIENT DESCRIPTION: 78 yo remote smoker with chronic bronchitis with VQ scan intermediated for PE>  LINES / TUBES:   CULTURES: 12-8 cdif>>++ 12-7 bcx2>>  ANTIBIOTICS: 12-8 merrem>> 12-8 flagyl>>  SIGNIFICANT EVENTS:    LEVEL OF CARE:  floor PRIMARY SERVICE:  [facility]:  pccm CODE STATUS:  full DIET:  reg DVT Px:  heparin GI Px:  pepcid  HISTORY OF PRESENT ILLNESS: 77 yo with SOB/cough x 8 months. Treated as OPT per Dr. Sherene Sires with Bronchodilators, prednisone, abx with improvement but not lasting more than 2 weeks. She is in [facility] on (needed prior to admit not using), co pain starting at head and running down her neck. Admitted and VQ scan was intertmediate for PE. PCCM asked to evaluate.  INTERVAL HISTORY:   VITAL SIGNS: Temp:  [97.1 F (36.2 C)-98.6 F (37 C)] 97.1 F (36.2 C) (12/10 0606) Pulse Rate:  [85-114] 105  (12/10 0606) Resp:  [18-20] 18  (12/10 0606) BP: (91-133)/(55-72) 133/61 mmHg (12/10 0606) SpO2:  [88 %-98 %] 90 % (12/10 0906)  PHYSICAL EXAMINATION: General:  Thin alert WF, remains on o2, less dyspneic 12-10 Neuro:  Intact HEENT:  No LAN, Neck:  No JVD Cardiovascular:  HSR Lungs:  Mild exp wheeze, kyphotic Abdomen:  +bs Musculoskeletal:  Intact wasted Skin:  warm   Lab 01/14/12 0503 01/13/12 0205 01/12/12 0641  NA 135 135 134*  K 4.1 3.2* 3.2*  CL 101 100 98  CO2 26 26 26   BUN 4* 3* 4*  CREATININE 0.32* 0.38* 0.37*  GLUCOSE 140* 96 90    Lab 01/14/12 0503 01/13/12 0205 01/12/12 0825  HGB 12.3 11.7* 11.9*  HCT 37.1 34.8* 35.8*  WBC 14.1* 14.1* 16.0*  PLT 523* 483* 480*   No results found.  ASSESSMENT / PLAN:  78 yo former smoker with 8 months of frequent cough, bronchitis , hypoxia and now with VQ ?for PE Plan: -Agree with heparin. -Transition to   Coumadin. -Consider non contrasted ct chest to evaluate lung dz when more stable or as outpatient. -O2 as needed, was likely needed prior to admit but will need an ambulatory desaturation prior to discharge inorder to qualify for home O2. -Head pain sounds like neuralgia associated with non erupting singles??.  Defer to primary. -Wean steroids. -BD's for underlying COPD. -LEDS ro dvt but regardless would start coumadin. -C Diff per IM. -PCCM available PRN.  Brett Canales Minor ACNP [facility] P[facility] Pager [phone] till 3 pm If no answer page [phone] 01/14/2012, 1:26 PM  Patient seen and examined, agree with above note.  I dictated the care and orders written for this patient under my direction.  Koren Bound, M.D. [phone]

## 2012-01-14 NOTE — Progress Notes (Signed)
ANTICOAGULATION CONSULT NOTE - Follow Up Consult  Pharmacy Consult for Heparin Indication: R/O PE, ACS  Allergies  Allergen Reactions  . Aspirin Nausea And Vomiting  . Codeine Hives  . Penicillins Swelling  . Shrimp (Shellfish Allergy) Swelling   Patient Measurements: Height: 5' (152.4 cm) Weight: 76 lb 15.1 oz (34.9 kg) IBW/kg (Calculated) : 45.5   Vital Signs: Temp: 97.1 F (36.2 C) (12/10 0606) Temp src: Oral (12/10 0606) BP: 133/61 mmHg (12/10 0606) Pulse Rate: 105  (12/10 0606)  Labs:  Basename 01/14/12 0902 01/14/12 0503 01/13/12 2220 01/13/12 1135 01/13/12 0205 01/12/12 1210 01/12/12 0825 01/12/12 0641 01/12/12 0040  HGB -- 12.3 -- -- 11.7* -- -- -- --  HCT -- 37.1 -- -- 34.8* -- 35.8* -- --  PLT -- 523* -- -- 483* -- 480* -- --  APTT -- -- -- -- -- -- -- -- 60*  LABPROT -- -- -- -- -- -- -- -- 14.9  INR -- -- -- -- -- -- -- -- 1.19  HEPARINUNFRC 0.84* -- 0.22* 0.53 -- -- -- -- --  CREATININE -- 0.32* -- -- 0.38* -- -- 0.37* --  CKTOTAL -- -- -- -- -- 59 -- 63 66  CKMB -- -- -- -- -- 5.3* -- 5.7* 5.9*  TROPONINI -- -- -- -- -- 0.40* -- 0.63* 1.41*   Estimated Creatinine Clearance: 31.4 ml/min (by C-G formula based on Cr of 0.32).  Medications:  Scheduled:     . albuterol  2.5 mg Nebulization Q6H WA  . budesonide  0.5 mg Nebulization BID  . cholecalciferol  1,200 Units Oral Daily  . famotidine  20 mg Oral BID  . furosemide  20 mg Oral Daily  . losartan  25 mg Oral Daily  . meropenem (MERREM) IV  1 g Intravenous Q12H  . methocarbamol  500 mg Oral BID  . methylPREDNISolone (SOLU-MEDROL) injection  40 mg Intravenous Q12H  . metroNIDAZOLE  500 mg Oral Q8H  . [COMPLETED] potassium chloride  40 mEq Oral Once  . tiotropium  18 mcg Inhalation Daily  . [DISCONTINUED] dextromethorphan  30 mg Oral BID  . [DISCONTINUED] dextromethorphan-guaiFENesin  1 tablet Oral BID  . [DISCONTINUED] enoxaparin (LOVENOX) injection  1 mg/kg Subcutaneous Once  . [DISCONTINUED]  enoxaparin (LOVENOX) injection  1.5 mg/kg Subcutaneous Q24H  . [DISCONTINUED] enoxaparin (LOVENOX) injection  1.5 mg/kg Subcutaneous Q24H  . [DISCONTINUED] potassium chloride  40 mEq Oral Once  . [DISCONTINUED] warfarin  4 mg Oral ONCE-1800  . [DISCONTINUED] warfarin  4 mg Oral ONCE-1800  . [DISCONTINUED] Warfarin - Pharmacist Dosing Inpatient   Does not apply q1800   Infusions:     . [EXPIRED] heparin 1,000 Units/hr (01/13/12 0344)  . heparin    . [DISCONTINUED] sodium chloride    . [DISCONTINUED] heparin 1,000 Units/hr (01/13/12 2115)  . [DISCONTINUED] heparin 1,100 Units/hr (01/14/12 0547)   PRN: acetaminophen, acetaminophen, albuterol, alum & mag hydroxide-simeth, HYDROmorphone (DILAUDID) injection, ondansetron (ZOFRAN) IV, ondansetron, zolpidem, [DISCONTINUED] benzonatate  Assessment:  Hep level now supratherapeutic at 1100 units/hr; level low last night, rate increased.  Attempted to change to Lovenox/Warfarin last night; patient refused and Heparin resumed.  VQ Scan indeterminate probability for PE. Cards note suggests pre-medication for CT to rule out PE (which is clinically unlikely per note).  Goal of Therapy:  Heparin level 0.3-0.7 units/ml Monitor platelets by anticoagulation protocol: Yes   Plan:  Reduce Heparin infusion to 1000 units/hr Recheck level in 8 hr Await further treatment plan in this interesting  patient. Daily Heparin level, CBC  Otho Bellows, PharmD Pager [phone] 01/14/2012,11:09 AM

## 2012-01-14 NOTE — Evaluation (Signed)
Physical Therapy Evaluation Patient Details Name: Bianca [name] MRN: [id number] DOB: 1930-11-23 Today's Date: 01/14/2012 Time: 1205-1226 PT Time Calculation (min): 21 min  PT Assessment / Plan / Recommendation Clinical Impression  pt. was admitted on 12/07  with worsening SOB, cough, Pneumonia,  probable  Neg. for- PE, icreased tropnin. Pt is motivated to ambulate. Pt was Independent PTA.Pt. will benefit from PT whiole in acute care to return to independent lrvel.    PT Assessment  Patient needs continued PT services    Follow Up Recommendations  Home health PT    Does the patient have the potential to tolerate intense rehabilitation      Barriers to Discharge        Equipment Recommendations  None recommended by PT    Recommendations for Other Services     Frequency Min 3X/week    Precautions / Restrictions Precautions Precaution Comments: dyspnea, O2,   Pertinent Vitals/Pain presats 955 3l HR 92 During 85% 2l HR 115 sats back to 92% after rest.      Mobility  Transfers Transfers: Sit to Stand;Stand to Sit Sit to Stand: 4: Min guard Stand to Sit: 5: Supervision Ambulation/Gait Ambulation/Gait Assistance: 4: Min assist Ambulation Distance (Feet): 80 Feet Assistive device: 1 person hand held assist Ambulation/Gait Assistance Details: pt moves slowly, encouraged pused ip breaths. Gait Pattern: Step-through pattern;Decreased stance time - right;Decreased stance time - left Gait velocity: decreased.    Shoulder Instructions     Exercises     PT Diagnosis: Difficulty walking;Generalized weakness  PT Problem List: Decreased strength;Decreased activity tolerance;Decreased mobility;Cardiopulmonary status limiting activity PT Treatment Interventions: DME instruction;Gait training;Patient/family education   PT Goals Acute Rehab PT Goals PT Goal Formulation: With patient Time For Goal Achievement: 01/28/12 Potential to Achieve Goals: Good Pt will go Supine/Side  to Sit: Independently PT Goal: Supine/Side to Sit - Progress: Goal set today Pt will go Sit to Supine/Side: Independently PT Goal: Sit to Supine/Side - Progress: Goal set today Pt will go Sit to Stand: Independently PT Goal: Sit to Stand - Progress: Goal set today Pt will go Stand to Sit: Independently PT Goal: Stand to Sit - Progress: Goal set today Pt will Ambulate: 51 - 150 feet;with supervision;with least restrictive assistive device PT Goal: Ambulate - Progress: Goal set today Pt will Perform Home Exercise Program: Independently PT Goal: Perform Home Exercise Program - Progress: Goal set today  Visit Information  Last PT Received On: 01/14/12 Assistance Needed: +1    Subjective Data  Subjective: I want to stretch my legs Patient Stated Goal: to go home.   Prior Functioning  Home Living Lives With: Spouse Available Help at Discharge: Family Type of Home: House Home Access: Stairs to enter [employer] of Steps: 1 Home Layout: Two level;Able to live on main level with bedroom/bathroom Bathroom Shower/Tub: Engineer, manufacturing systems: Standard Home Adaptive Equipment: None Prior Function Level of Independence: Independent Communication Communication: No difficulties    Cognition  Overall Cognitive Status: Appears within functional limits for tasks assessed/performed Arousal/Alertness: Awake/alert Orientation Level: Appears intact for tasks assessed Behavior During Session: [facility] for tasks performed    Extremity/Trunk Assessment Right Upper Extremity Assessment RUE ROM/Strength/Tone: [facility] for tasks assessed Left Upper Extremity Assessment LUE ROM/Strength/Tone: [facility] for tasks assessed Right Lower Extremity Assessment RLE ROM/Strength/Tone: [facility] for tasks assessed Left Lower Extremity Assessment LLE ROM/Strength/Tone: [facility] for tasks assessed   Balance    End of Session PT - End of Session Activity Tolerance: Patient tolerated treatment well;Patient  limited  by fatigue Patient left: in chair;with call bell/phone within reach  GP     Rada Hay 01/14/2012, 2:59 PM  [phone]

## 2012-01-14 NOTE — Progress Notes (Signed)
ANTICOAGULATION CONSULT NOTE - F/U Consult  Pharmacy Consult for Transition to Lovenox and Warfarin Indication: Possible PE  Allergies  Allergen Reactions  . Aspirin Nausea And Vomiting  . Codeine Hives  . Penicillins Swelling  . Shrimp (Shellfish Allergy) Swelling    Patient Measurements: Height: 5' (152.4 cm) Weight: 76 lb 15.1 oz (34.9 kg) IBW/kg (Calculated) : 45.5   Vital Signs: Temp: 98.3 F (36.8 C) (12/09 2110) Temp src: Oral (12/09 2110) BP: 110/55 mmHg (12/09 2110) Pulse Rate: 105  (12/09 2110)  Labs:  Basename 01/13/12 2220 01/13/12 1135 01/13/12 0205 01/12/12 1210 01/12/12 0825 01/12/12 0641 01/12/12 0040 01/11/12 2030  HGB -- -- 11.7* -- 11.9* -- -- --  HCT -- -- 34.8* -- 35.8* 35.7* -- --  PLT -- -- 483* -- 480* 469* -- --  APTT -- -- -- -- -- -- 60* --  LABPROT -- -- -- -- -- -- 14.9 --  INR -- -- -- -- -- -- 1.19 --  HEPARINUNFRC 0.22* 0.53 0.18* -- -- -- -- --  CREATININE -- -- 0.38* -- -- 0.37* -- 0.46*  CKTOTAL -- -- -- 59 -- 63 66 --  CKMB -- -- -- 5.3* -- 5.7* 5.9* --  TROPONINI -- -- -- 0.40* -- 0.63* 1.41* --    Estimated Creatinine Clearance: 31.4 ml/min (by C-G formula based on Cr of 0.38).   Medical History: Past Medical History  Diagnosis Date  . DIVERTICULOSIS, COLON   . VITAMIN D DEFICIENCY   . HYPERLIPIDEMIA   . HYPERTENSION   . OSTEOPOROSIS   . COPD (chronic obstructive pulmonary disease)     Medications:  Scheduled:     . albuterol  2.5 mg Nebulization Q6H WA  . budesonide  0.5 mg Nebulization BID  . cholecalciferol  1,200 Units Oral Daily  . famotidine  20 mg Oral BID  . [COMPLETED] heparin  1,000 Units Intravenous Once  . losartan  25 mg Oral Daily  . meropenem (MERREM) IV  1 g Intravenous Q12H  . methocarbamol  500 mg Oral BID  . methylPREDNISolone (SOLU-MEDROL) injection  40 mg Intravenous Q12H  . metroNIDAZOLE  500 mg Oral Q8H  . [COMPLETED] potassium chloride  40 mEq Oral Once  . tiotropium  18 mcg Inhalation  Daily  . [DISCONTINUED] dextromethorphan  30 mg Oral BID  . [DISCONTINUED] dextromethorphan-guaiFENesin  1 tablet Oral BID  . [DISCONTINUED] enoxaparin (LOVENOX) injection  1 mg/kg Subcutaneous Once  . [DISCONTINUED] enoxaparin (LOVENOX) injection  1.5 mg/kg Subcutaneous Q24H  . [DISCONTINUED] enoxaparin (LOVENOX) injection  1.5 mg/kg Subcutaneous Q24H  . [DISCONTINUED] potassium chloride  40 mEq Oral Once  . [DISCONTINUED] warfarin  4 mg Oral ONCE-1800  . [DISCONTINUED] warfarin  4 mg Oral ONCE-1800  . [DISCONTINUED] Warfarin - Pharmacist Dosing Inpatient   Does not apply q1800   Infusions:     . [EXPIRED] heparin 1,000 Units/hr (01/13/12 0344)  . heparin    . [DISCONTINUED] sodium chloride    . [DISCONTINUED] heparin    . [DISCONTINUED] heparin 1,000 Units/hr (01/13/12 2115)    Assessment: 78 yo F with V scan intermediate for PE, currently on IV heparin.  Preliminary LE venous duplex negative for DVT. HL = 0.22 units/ml  (prior HL was therapuetic)  No IV interuptions or bleeding noted by RN.    Goal of Therapy:  HL= 0.3-0.7 units/ml Monitor platelets by anticoagulation protocol: Yes  Plan:  Increase Heparin drip to 1100 units/hr.    Recheck level in 8 hours.  Daily CBC/HL  Lorenza Evangelist 01/14/2012 12:36 AM

## 2012-01-14 NOTE — Progress Notes (Signed)
ANTICOAGULATION CONSULT NOTE - Initial Consult  Pharmacy Consult for Lovenox/Warfarin Indication: Possible PE  Allergies  Allergen Reactions  . Aspirin Nausea And Vomiting  . Codeine Hives  . Penicillins Swelling  . Shrimp (Shellfish Allergy) Swelling   Patient Measurements: Height: 5' (152.4 cm) Weight: 76 lb 15.1 oz (34.9 kg) IBW/kg (Calculated) : 45.5   Vital Signs: Temp: 98.2 F (36.8 C) (12/10 1442) Temp src: Oral (12/10 1442) BP: 90/47 mmHg (12/10 1442) Pulse Rate: 102  (12/10 1442)  Labs:  Basename 01/14/12 0902 01/14/12 0503 01/13/12 2220 01/13/12 1135 01/13/12 0205 01/12/12 1210 01/12/12 0825 01/12/12 0641 01/12/12 0040  HGB -- 12.3 -- -- 11.7* -- -- -- --  HCT -- 37.1 -- -- 34.8* -- 35.8* -- --  PLT -- 523* -- -- 483* -- 480* -- --  APTT -- -- -- -- -- -- -- -- 60*  LABPROT -- -- -- -- -- -- -- -- 14.9  INR -- -- -- -- -- -- -- -- 1.19  HEPARINUNFRC 0.84* -- 0.22* 0.53 -- -- -- -- --  CREATININE -- 0.32* -- -- 0.38* -- -- 0.37* --  CKTOTAL -- -- -- -- -- 59 -- 63 66  CKMB -- -- -- -- -- 5.3* -- 5.7* 5.9*  TROPONINI -- -- -- -- -- 0.40* -- 0.63* 1.41*   Estimated Creatinine Clearance: 31.4 ml/min (by C-G formula based on Cr of 0.32).  Medical History: Past Medical History  Diagnosis Date  . DIVERTICULOSIS, COLON   . VITAMIN D DEFICIENCY   . HYPERLIPIDEMIA   . HYPERTENSION   . OSTEOPOROSIS   . COPD (chronic obstructive pulmonary disease)    Medications:  Scheduled:    . albuterol  2.5 mg Nebulization Q6H WA  . budesonide  0.5 mg Nebulization BID  . cholecalciferol  1,200 Units Oral Daily  . famotidine  20 mg Oral BID  . furosemide  20 mg Oral Daily  . losartan  25 mg Oral Daily  . meropenem (MERREM) IV  1 g Intravenous Q12H  . methocarbamol  500 mg Oral BID  . metroNIDAZOLE  500 mg Oral Q8H  . predniSONE  60 mg Oral Q breakfast  . tiotropium  18 mcg Inhalation Daily  . [DISCONTINUED] dextromethorphan-guaiFENesin  1 tablet Oral BID  .  [DISCONTINUED] enoxaparin (LOVENOX) injection  1 mg/kg Subcutaneous Once  . [DISCONTINUED] enoxaparin (LOVENOX) injection  1.5 mg/kg Subcutaneous Q24H  . [DISCONTINUED] enoxaparin (LOVENOX) injection  1.5 mg/kg Subcutaneous Q24H  . [DISCONTINUED] methylPREDNISolone (SOLU-MEDROL) injection  40 mg Intravenous Q12H  . [DISCONTINUED] warfarin  4 mg Oral ONCE-1800  . [DISCONTINUED] warfarin  4 mg Oral ONCE-1800  . [DISCONTINUED] Warfarin - Pharmacist Dosing Inpatient   Does not apply q1800    Assessment: 41 yoF with c/o worsening cough/pleuritic chest pain. Seen in ED 5 days ago dx of PNA, treated with po Levaquin.  Cardiac enzymes: 0.09->0.40 (no further levels)   Started on Heparin infusion; r/o PE. Shrimp allergy and poor renal function precluded chest CT.  VQ scan indeterminate for PE, Cardiology following; see notes.  Attempted to transition to Lovenox/Warfarin last night, but patient unwilling; now patient amenable to discontinuation of IV Heparin.  Concurrent Metronidazole can increase PT/INR  Goal of Therapy:  INR 2-3 Heparin level 1-2 units/ml (for q24 hr dosing) Monitor platelets by anticoagulation protocol: Yes   Plan:   Lovenox 35mg  x1 dose today at 1600 (1mg /kg),then  Lovenox 50mg  daily beginning 12/11 (1.5mg /kg)  Warfarin 4mg  today (baseline INR 1.19)  Daily Protime/INR. CBC q72 hr  LMWH level at steady state with poor renal function and low TBW.  Coumadin education: booklet, video, teaching.  Otho Bellows PharmD Pager [phone] 01/14/2012,3:08 PM

## 2012-01-14 NOTE — Progress Notes (Signed)
Patient ID: Bianca [name], female   DOB: 1930-10-12, 78 y.o.   MRN: [id number]    Subjective:  Coughing no chest pain Frustrated by all diagnosis like CHF, PE and C-diff  Objective:  Filed Vitals:   01/13/12 2102 01/13/12 2110 01/14/12 0150 01/14/12 0606  BP: 121/67 110/55 98/55 133/61  Pulse: 85 105 94 105  Temp: 97.3 F (36.3 C) 98.3 F (36.8 C) 98.5 F (36.9 C) 97.1 F (36.2 C)  TempSrc: Oral Oral Oral Oral  Resp: 19 20 20 18   Height:      Weight:      SpO2: 98% 93% 94% 92%    Intake/Output from previous day:  Intake/Output Summary (Last 24 hours) at 01/14/12 0747 Last data filed at 01/14/12 0700  Gross per 24 hour  Intake  526.5 ml  Output    400 ml  Net  126.5 ml    Physical Exam: Affect appropriate Thin frail female HEENT: normal Neck supple with no adenopathy JVP normal no bruits no thyromegaly Lungs clearer  with no wheezing and good diaphragmatic motion Heart:  S1/S2 no murmur, no rub, gallop or click PMI normal Abdomen: benighn, BS positve, no tenderness, no AAA no bruit.  No HSM or HJR Distal pulses intact with no bruits No edema Neuro non-focal Skin warm and dry No muscular weakness   Lab Results: Basic Metabolic Panel:  Basename 01/14/12 0503 01/13/12 0205  NA 135 135  K 4.1 3.2*  CL 101 100  CO2 26 26  GLUCOSE 140* 96  BUN 4* 3*  CREATININE 0.32* 0.38*  CALCIUM 7.6* 7.4*  MG -- --  PHOS -- --   Liver Function Tests:  Basename 01/11/12 2030  AST 31  ALT 17  ALKPHOS 106  BILITOT 0.3  PROT 6.9  ALBUMIN 2.7*   CBC:  Basename 01/14/12 0503 01/13/12 0205 01/11/12 2030  WBC 14.1* 14.1* --  NEUTROABS -- -- 15.7*  HGB 12.3 11.7* --  HCT 37.1 34.8* --  MCV 94.4 94.8 --  PLT 523* 483* --   Cardiac Enzymes:  Basename 01/12/12 1210 01/12/12 0641 01/12/12 0040  CKTOTAL 59 63 66  CKMB 5.3* 5.7* 5.9*  CKMBINDEX -- -- --  TROPONINI 0.40* 0.63* 1.41*   D-Dimer:  Basename 01/11/12 2030  DDIMER 1.46*    Imaging: Nm  Pulmonary Perf And Vent  01/12/2012  *RADIOLOGY REPORT*  Clinical Data:  78 year old female with chest pain, shortness of breath and elevated D-dimer.  NUCLEAR MEDICINE VENTILATION - PERFUSION LUNG SCAN  Technique:  Ventilation images were obtained in multiple projections using inhaled aerosol technetium 99 M DTPA.  Perfusion images were obtained in multiple projections after intravenous injection of Tc-25m MAA.  Radiopharmaceuticals:  Tc-19m DTPA aerosol and 5 mCi Tc-27m MAA.  Comparison: 01/11/2012 chest radiograph  Findings:  Ventilation:  This is a suboptimal ventilation study with clumping of agent within the lungs.  Multiple scattered areas of decreased ventilation are noted, some which may be artifactual.  Perfusion:  Multiple scattered bilateral perfusion defects are identified, primarily in the mid and upper lungs.  Many of the perfusion defects appear to match ventilation defects.  IMPRESSION: Indeterminate probability for pulmonary embolus (20-80%).   Original Report Authenticated By: Harmon Pier, M.D.     Cardiac Studies:  ECG: ST rate 101  Otherwise normal 01/12/12   Telemetry:  SR no VT or PAF 01/14/2012   Echo: Reviewed EF 35-40%    Medications:      . albuterol  2.5  mg Nebulization Q6H WA  . budesonide  0.5 mg Nebulization BID  . cholecalciferol  1,200 Units Oral Daily  . famotidine  20 mg Oral BID  . losartan  25 mg Oral Daily  . meropenem (MERREM) IV  1 g Intravenous Q12H  . methocarbamol  500 mg Oral BID  . methylPREDNISolone (SOLU-MEDROL) injection  40 mg Intravenous Q12H  . metroNIDAZOLE  500 mg Oral Q8H  . [COMPLETED] potassium chloride  40 mEq Oral Once  . tiotropium  18 mcg Inhalation Daily  . [DISCONTINUED] dextromethorphan  30 mg Oral BID  . [DISCONTINUED] dextromethorphan-guaiFENesin  1 tablet Oral BID  . [DISCONTINUED] enoxaparin (LOVENOX) injection  1 mg/kg Subcutaneous Once  . [DISCONTINUED] enoxaparin (LOVENOX) injection  1.5 mg/kg Subcutaneous Q24H  .  [DISCONTINUED] enoxaparin (LOVENOX) injection  1.5 mg/kg Subcutaneous Q24H  . [DISCONTINUED] potassium chloride  40 mEq Oral Once  . [DISCONTINUED] warfarin  4 mg Oral ONCE-1800  . [DISCONTINUED] warfarin  4 mg Oral ONCE-1800  . [DISCONTINUED] Warfarin - Pharmacist Dosing Inpatient   Does not apply q1800        . [EXPIRED] heparin 1,000 Units/hr (01/13/12 0344)  . heparin 1,100 Units/hr (01/14/12 0547)  . [DISCONTINUED] sodium chloride    . [DISCONTINUED] heparin 1,000 Units/hr (01/13/12 2115)    Assessment/Plan:  Elevated Troponin:  No clincial syndrome of CAD.  Pleuritic pain from cough resolved  ECG with loss of R wave progressin With EF low may have had subacute MI or Taka Tsubo.  BNP elevated continue ARB and add low dose diuretic Does not appear to be a need for further w/u Pulm:  Bilateral pneumonia V/Q indeterminant due to multiple ventialtion defects from pneumonia and COPD It is predictable That her V/Q would be indeterminate with her lung disease. She indicates a shrimp allergy 25 years ago that was significant with Swelling of the eyes ? What else.  Not clear to me if she shouldn't be premedicated and have CTA to R/O PE.  She is at high risk Category for bleeding being elderly thin female and there was no DVT by duplex.  Pulmonary /CCM and primary should address risks And benefits to make a diagnosis.  Clincially PE not likely  Cdiff:  From recent antibiotics Rx per primary service  Charlton Haws 01/14/2012, 7:47 AM

## 2012-01-15 LAB — CBC
MCV: 92.9 fL (ref 78.0–100.0)
Platelets: 513 10*3/uL — ABNORMAL HIGH (ref 150–400)
RBC: 3.65 MIL/uL — ABNORMAL LOW (ref 3.87–5.11)
WBC: 12 10*3/uL — ABNORMAL HIGH (ref 4.0–10.5)

## 2012-01-15 LAB — PROTIME-INR
INR: 1.07 (ref 0.00–1.49)
Prothrombin Time: 13.8 seconds (ref 11.6–15.2)

## 2012-01-15 LAB — BASIC METABOLIC PANEL
CO2: 28 mEq/L (ref 19–32)
Calcium: 7.9 mg/dL — ABNORMAL LOW (ref 8.4–10.5)
GFR calc Af Amer: >90 mL/min (ref >90)
GFR calc non Af Amer: >90 mL/min (ref >90)
Sodium: 135 mEq/L (ref 135–145)

## 2012-01-15 MED ORDER — ENOXAPARIN SODIUM 60 MG/0.6ML ~~LOC~~ SOLN
60.0000 mg | Freq: Every day | SUBCUTANEOUS | Status: DC
  Administered 2012-01-15 – 2012-01-17 (×3): 60 mg via SUBCUTANEOUS
  Filled 2012-01-15 (×3): qty 0.6

## 2012-01-15 MED ORDER — WARFARIN SODIUM 4 MG PO TABS
4.0000 mg | ORAL_TABLET | Freq: Once | ORAL | Status: AC
  Administered 2012-01-15: 4 mg via ORAL
  Filled 2012-01-15: qty 1

## 2012-01-15 MED ORDER — PREDNISONE 50 MG PO TABS
50.0000 mg | ORAL_TABLET | Freq: Every day | ORAL | Status: DC
Start: 2012-01-16 — End: 2012-01-17
  Administered 2012-01-16 – 2012-01-17 (×2): 50 mg via ORAL
  Filled 2012-01-15 (×4): qty 1

## 2012-01-15 MED ORDER — POTASSIUM CHLORIDE 20 MEQ/15ML (10%) PO LIQD
60.0000 meq | Freq: Once | ORAL | Status: AC
  Administered 2012-01-15: 60 meq via ORAL
  Filled 2012-01-15: qty 45

## 2012-01-15 NOTE — Progress Notes (Signed)
Patient ID: Bianca [name], female   DOB: 03-29-1930, 78 y.o.   MRN: [id number]  Subjective:  Weak but less coughing  Objective:  Filed Vitals:   01/14/12 2154 01/15/12 0203 01/15/12 0543 01/15/12 0820  BP: 100/57 122/76 145/95   Pulse: 91 92 117   Temp: 98.2 F (36.8 C) 97.8 F (36.6 C) 98.7 F (37.1 C)   TempSrc: Oral Oral Oral   Resp: 18 18 20    Height:      Weight:   86 lb 6.7 oz (39.2 kg)   SpO2: 96% 94% 93% 95%    Intake/Output from previous day:  Intake/Output Summary (Last 24 hours) at 01/15/12 0834 Last data filed at 01/15/12 0157  Gross per 24 hour  Intake  584.3 ml  Output    327 ml  Net  257.3 ml    Physical Exam: Affect appropriate Thin frail female HEENT: normal Neck supple with no adenopathy JVP normal no bruits no thyromegaly Lungs clearer  with no wheezing and good diaphragmatic motion Heart:  S1/S2 no murmur, no rub, gallop or click PMI normal Abdomen: benighn, BS positve, no tenderness, no AAA no bruit.  No HSM or HJR Distal pulses intact with no bruits No edema Neuro non-focal Skin warm and dry No muscular weakness   Lab Results: Basic Metabolic Panel:  Basename 01/15/12 0443 01/14/12 0503  NA 135 135  K 3.2* 4.1  CL 101 101  CO2 28 26  GLUCOSE 95 140*  BUN 9 4*  CREATININE 0.33* 0.32*  CALCIUM 7.9* 7.6*  MG -- --  PHOS -- --   CBC:  Basename 01/15/12 0443 01/14/12 0503  WBC 12.0* 14.1*  NEUTROABS -- --  HGB 11.5* 12.3  HCT 33.9* 37.1  MCV 92.9 94.4  PLT 513* 523*   Cardiac Enzymes:  Basename 01/12/12 1210  CKTOTAL 59  CKMB 5.3*  CKMBINDEX --  TROPONINI 0.40*    Imaging: No results found.  Cardiac Studies:  ECG: ST rate 101  Otherwise normal 01/12/12   Telemetry:  SR no VT or PAF 01/15/2012   Echo: Reviewed EF 35-40%    Medications:      . albuterol  2.5 mg Nebulization Q6H WA  . budesonide  0.5 mg Nebulization BID  . cholecalciferol  1,200 Units Oral Daily  . [COMPLETED] enoxaparin (LOVENOX)  injection  35 mg Subcutaneous Once  . enoxaparin (LOVENOX) injection  50 mg Subcutaneous Daily  . famotidine  20 mg Oral BID  . furosemide  20 mg Oral Daily  . losartan  25 mg Oral Daily  . meropenem (MERREM) IV  1 g Intravenous Q12H  . methocarbamol  500 mg Oral BID  . metroNIDAZOLE  500 mg Oral Q8H  . [COMPLETED] patient's guide to using coumadin book   Does not apply Once  . [COMPLETED] potassium chloride  60 mEq Oral Once  . predniSONE  60 mg Oral Q breakfast  . tiotropium  18 mcg Inhalation Daily  . [COMPLETED] warfarin  4 mg Oral ONCE-1800  . warfarin   Does not apply Once  . Warfarin - Pharmacist Dosing Inpatient   Does not apply q1800  . [DISCONTINUED] methylPREDNISolone (SOLU-MEDROL) injection  40 mg Intravenous Q12H  . [DISCONTINUED] warfarin  6 mg Oral ONCE-1800        . [DISCONTINUED] heparin 1,100 Units/hr (01/14/12 0547)  . [DISCONTINUED] heparin 1,000 Units/hr (01/14/12 1118)    Assessment/Plan:  Elevated Troponin:  No clincial syndrome of CAD.  Pleuritic pain  from cough resolved  ECG with loss of R wave progressin With EF low may have had subacute MI or Taka Tsubo.  BNP elevated continue ARB and add low dose diuretic Does not appear to be a need for further w/u Pulm:  Bilateral pneumonia V/Q indeterminant due to multiple ventialtion defects from pneumonia and COPD It is predictable That her V/Q would be indeterminate with her lung disease. She indicates a shrimp allergy 25 years ago that was significant with Swelling of the eyes ? What else.  Not clear to me if she shouldn't be premedicated and have CTA to R/O PE.  She is at high risk Category for bleeding being elderly thin female and there was no DVT by duplex. Primary service decided to not give contrast and Rx PE Cdiff:  From recent antibiotics Rx per primary service  Will sign off and arrange F/U with D Myrtis Ser in 4-5 weeks  Charlton Haws 01/15/2012, 8:34 AM

## 2012-01-15 NOTE — Progress Notes (Signed)
ANTICOAGULATION CONSULT NOTE - F/U Consult  Pharmacy Consult for Lovenox/Warfarin Indication: Possible PE  Allergies  Allergen Reactions  . Aspirin Nausea And Vomiting  . Codeine Hives  . Penicillins Swelling  . Shrimp (Shellfish Allergy) Swelling   Patient Measurements: Height: 5' (152.4 cm) Weight: 86 lb 6.7 oz (39.2 kg) IBW/kg (Calculated) : 45.5   Vital Signs: Temp: 98.7 F (37.1 C) (12/11 0543) Temp src: Oral (12/11 0543) BP: 145/95 mmHg (12/11 0543) Pulse Rate: 117  (12/11 0543)  Labs:  Bianca [name] 01/15/12 0443 01/14/12 0902 01/14/12 0503 01/13/12 2220 01/13/12 1135 01/13/12 0205 01/12/12 1210  HGB 11.5* -- 12.3 -- -- -- --  HCT 33.9* -- 37.1 -- -- 34.8* --  PLT 513* -- 523* -- -- 483* --  APTT -- -- -- -- -- -- --  LABPROT 13.8 -- -- -- -- -- --  INR 1.07 -- -- -- -- -- --  HEPARINUNFRC -- 0.84* -- 0.22* 0.53 -- --  CREATININE 0.33* -- 0.32* -- -- 0.38* --  CKTOTAL -- -- -- -- -- -- 59  CKMB -- -- -- -- -- -- 5.3*  TROPONINI -- -- -- -- -- -- 0.40*   Estimated Creatinine Clearance: 35.3 ml/min (by C-G formula based on Cr of 0.33).  Medications:  Scheduled:     . albuterol  2.5 mg Nebulization Q6H WA  . budesonide  0.5 mg Nebulization BID  . cholecalciferol  1,200 Units Oral Daily  . [COMPLETED] enoxaparin (LOVENOX) injection  35 mg Subcutaneous Once  . enoxaparin (LOVENOX) injection  50 mg Subcutaneous Daily  . famotidine  20 mg Oral BID  . furosemide  20 mg Oral Daily  . losartan  25 mg Oral Daily  . meropenem (MERREM) IV  1 g Intravenous Q12H  . methocarbamol  500 mg Oral BID  . metroNIDAZOLE  500 mg Oral Q8H  . [COMPLETED] patient's guide to using coumadin book   Does not apply Once  . [COMPLETED] potassium chloride  60 mEq Oral Once  . predniSONE  60 mg Oral Q breakfast  . tiotropium  18 mcg Inhalation Daily  . [COMPLETED] warfarin  4 mg Oral ONCE-1800  . warfarin   Does not apply Once  . Warfarin - Pharmacist Dosing Inpatient   Does not apply  q1800  . [DISCONTINUED] methylPREDNISolone (SOLU-MEDROL) injection  40 mg Intravenous Q12H  . [DISCONTINUED] warfarin  6 mg Oral ONCE-1800    Assessment: 70 yoF with c/o worsening cough/pleuritic chest pain. Seen in ED recently w/ PNA, treated with po Levaquin.  Started on Heparin infusion; r/o PE. Shrimp allergy and poor renal function precluded chest CT.  VQ scan indeterminate for PE, Cardiology following.  Transitioned from IV Heparin to Lovenox/Warfarin 12/10.  INR remains subtherapeutic (1.07) as expected after first dose of warfarin.  Concurrent Metronidazole can increase PT/INR  Goal of Therapy:  INR 2-3 Anti-Xa level 0.6-1.2 units/ml 4hrs after LMWH dose given Monitor platelets by anticoagulation protocol: Yes   Plan:   Lovenox 60mg  daily beginning 12/11 (1.5mg /kg)  Repeat Warfarin 4mg  today  Daily Protime/INR. CBC q72 hr  LMWH level at steady state with poor renal function and low TBW.  Coumadin education: booklet, video, teaching.  Lynann Beaver PharmD, BCPS Pager [phone] 01/15/2012 10:04 AM

## 2012-01-15 NOTE — Progress Notes (Signed)
Physical Therapy Treatment Patient Details Name: Bianca [name] MRN: [id number] DOB: 05-03-1930 Today's Date: 01/15/2012 Time: 5784-6962 PT Time Calculation (min): 24 min  PT Assessment / Plan / Recommendation Comments on Treatment Session  Pt. is motivated but limited by cardiop/resp status. Sats 88 % 2l and HR 125 while walking. sats back to 91 after rest    Follow Up Recommendations  Home health PT;Supervision/Assistance - 24 hour     Does the patient have the potential to tolerate intense rehabilitation     Barriers to Discharge        Equipment Recommendations  None recommended by PT    Recommendations for Other Services    Frequency Min 3X/week   Plan Discharge plan remains appropriate;Frequency remains appropriate    Precautions / Restrictions Precautions Precaution Comments: dyspnea, O2,   Pertinent Vitals/Pain sats 88% 2l HR 125 while ambulating.    Mobility  Transfers Sit to Stand: 4: Min assist Stand to Sit: 5: Supervision Ambulation/Gait Ambulation/Gait Assistance: 4: Min assist Ambulation Distance (Feet): 150 Feet Assistive device: 1 person hand held assist Ambulation/Gait Assistance Details: pt is unsteady and requires UE support . pt does not want to use RW Gait Pattern: Decreased stride length Gait velocity: decreased    Exercises     PT Diagnosis:    PT Problem List:   PT Treatment Interventions:     PT Goals Acute Rehab PT Goals PT Goal: Supine/Side to Sit - Progress: Met Pt will go Sit to Supine/Side: with supervision PT Goal: Sit to Supine/Side - Progress: Progressing toward goal Pt will go Sit to Stand: Independently PT Goal: Sit to Stand - Progress: Progressing toward goal Pt will go Stand to Sit: Independently PT Goal: Stand to Sit - Progress: Progressing toward goal Pt will Ambulate: 51 - 150 feet;with supervision;with least restrictive assistive device PT Goal: Ambulate - Progress: Progressing toward goal  Visit Information  Last  PT Received On: 01/15/12 Assistance Needed: +1    Subjective Data  Subjective: I want to do what I can   Cognition  Overall Cognitive Status: Appears within functional limits for tasks assessed/performed Arousal/Alertness: Awake/alert Orientation Level: Appears intact for tasks assessed Behavior During Session: [facility] for tasks performed    Balance     End of Session PT - End of Session Activity Tolerance: Patient limited by fatigue;Treatment limited secondary to medical complications (Comment) Patient left: in bed;with call bell/phone within reach;with family/visitor present   GP     Rada Hay 01/15/2012, 5:19 PM

## 2012-01-15 NOTE — Progress Notes (Addendum)
[facility] PROGRESS NOTE  Bianca [name] ZOX:[id number] DOB: 1930/06/16 DOA: 01/11/2012 PCP: [name], MD  Assessment/Plan:  77 yo F presents with healthcare associated pneumonia who was initially started on meropenem and plan to continue for a 7 day course.  She had elevated troponins and cardiology was consulted but felt she was not having an NSTEMI.  Her ECHO demonstrated new heart failure with EF of 35-40% and focal wall motion abnormalities which may be consistent with MI versus Takotsubo cardiomyopathy.  She had a moderate probability V/Q scan and does not want to have a CT done to verify diagnosis because she was told by her allergist to never had contrast.  She also has COPD exacerbation and has been on a steroid taper.  She has some anxiety, but seems to be coping well.  Will likely need home oxygen.  Home in 2-3 days.     Healthcare-associated pneumonia:  Acute respiratory failure with hypoxia.   -  Continue meropenem day 5 -  BCx2 12/7 NGTD -  Sputum culture pending collection -  Legionella Ag neg -  Step pneumo Ag neg -  Continue oxygen prn, oxygen saturations in high 80s to low 90s on 2L  -  mucinex DM and tessalon discontinued because patient concerned that they gave her diarrhea -  Pulmonology has recommended a dry CT scan chest to evaluate underlying lung disease as outpatient   Chest pain:  Most likely related to pneumonia versus PE per cardiology.  She had elevated troponins at admission and has evidence of wall motion abnormality on her ECHO with systolic heart failure which is new.   -  Appreciate cardiology recommendations -  Patient does not want CT scan of chest to rule out PE because she has severe shrimp allergy and her allergist told her to never have IV contrast because it could kill her.    Presumptive PE:  -  Continue coumadin day 2 and lovenox  CHF:  Possible ICM versus takotsubo cardiomyopathy.  EF of 35-40% -  No further evaluation per  cardiology -  Patient on lasix and ARB.  No BB due to COPD  COPD exacerbation, diminished with high pitched full expiratory wheeze today -  Albuterol scheduled and prn -  Spiriva  -  Wean prednisone to 50mg  daily -  Continue budesonide  Also: C diff diarrhea:  Diarrhea has resolved.  - Flagyl day 4 to continue for 14 day course  Hypertension:   Blood pressures stable.  Continue losartan Leukocytosis, trending down on antibiotics, trend WBC Acute normocytic anemia, likely due to IVF and marrow suppression from acute infection, hgb stable Thrombocytosis, likely elevated as acute phase reactant and stable.  Trend Hypokalemia, likely due to poor po intake.  patient prefers oral solution KCl  DIET:  Healthy heart diet ACCESS:  PIV IVF:  OFF PROPH:  Lovenox and coumadin  Code Status: Full code Family Communication: spoke to patient who was alone at bedside Disposition Plan:  pending completion of 7 days of IV antibiotics   Consultants:  Cardiology  Pulmonary critical care  Procedures:  V/Q 12/8  ECHO   CXR  Antibiotics:  Vancomycin x 1 12/7  Meropenem 12/7 >>   HPI/Subjective:  Has been voiding frequently.  Continues to have shortness of breath and cough that sounds rhonchorous but which is nonproductive.  Stools are formed and only had 2 yesterday.  Denies fevers, chills, nausea, vomiting.    Objective: Filed Vitals:   01/15/12 0820 01/15/12 1038  01/15/12 1145 01/15/12 1436  BP:  112/64  105/53  Pulse:  94  102  Temp:    98.2 F (36.8 C)  TempSrc:    Oral  Resp:    20  Height:      Weight:      SpO2: 95%  92% 92%    Intake/Output Summary (Last 24 hours) at 01/15/12 1931 Last data filed at 01/15/12 1300  Gross per 24 hour  Intake    360 ml  Output    125 ml  Net    235 ml   Filed Weights   01/11/12 1940 01/12/12 0100 01/15/12 0543  Weight: 34.473 kg (76 lb) 38.3 kg (84 lb 7 oz) 39.2 kg (86 lb 6.7 oz)    Exam:   General:  Thin CF, no acute  distress, lying in bed with nasal canula in place, several desaturations while i was in the room today  HEENT:  MMM  Cardiovascular: tachycardic, regular rhythm, no murmurs, rubs, or gallops, 2+ pulses  Respiratory: Bronchial BS and diminished bilaterally with wheezes  Abdomen: NABS, soft, nondistended, nontender  MSK:  No LEE  Data Reviewed: Basic Metabolic Panel:  Lab 01/15/12 9147 01/14/12 0503 01/13/12 0205 01/12/12 0641 01/11/12 2030  NA 135 135 135 134* 134*  K 3.2* 4.1 3.2* 3.2* 3.3*  CL 101 101 100 98 93*  CO2 28 26 26 26 29   GLUCOSE 95 140* 96 90 102*  BUN 9 4* 3* 4* 7  CREATININE 0.33* 0.32* 0.38* 0.37* 0.46*  CALCIUM 7.9* 7.6* 7.4* 7.8* 9.0  MG -- -- -- -- --  PHOS -- -- -- -- --   Liver Function Tests:  Lab 01/11/12 2030  AST 31  ALT 17  ALKPHOS 106  BILITOT 0.3  PROT 6.9  ALBUMIN 2.7*   No results found for this basename: LIPASE:5,AMYLASE:5 in the last 168 hours No results found for this basename: AMMONIA:5 in the last 168 hours CBC:  Lab 01/15/12 0443 01/14/12 0503 01/13/12 0205 01/12/12 0825 01/12/12 0641 01/11/12 2030  WBC 12.0* 14.1* 14.1* 16.0* 17.2* --  NEUTROABS -- -- -- -- -- 15.7*  HGB 11.5* 12.3 11.7* 11.9* 12.0 --  HCT 33.9* 37.1 34.8* 35.8* 35.7* --  MCV 92.9 94.4 94.8 93.5 93.5 --  PLT 513* 523* 483* 480* 469* --   Cardiac Enzymes:  Lab 01/12/12 1210 01/12/12 0641 01/12/12 0040  CKTOTAL 59 63 66  CKMB 5.3* 5.7* 5.9*  CKMBINDEX -- -- --  TROPONINI 0.40* 0.63* 1.41*   BNP (last 3 results)  Basename 01/13/12 1942  PROBNP 2922.0*   CBG: No results found for this basename: GLUCAP:5 in the last 168 hours  Recent Results (from the past 240 hour(s))  CULTURE, BLOOD (ROUTINE X 2)     Status: Normal (Preliminary result)   Collection Time   01/11/12  8:30 PM      Component Value Range Status Comment   Specimen Description BLOOD RIGHT ARM   Final    Special Requests BOTTLES DRAWN AEROBIC AND ANAEROBIC    Final    Culture  Setup  Time 01/12/2012 15:26   Final    Culture     Final    Value:        BLOOD CULTURE RECEIVED NO GROWTH TO DATE CULTURE WILL BE HELD FOR 5 DAYS BEFORE ISSUING A FINAL NEGATIVE REPORT   Report Status PENDING   Incomplete   CULTURE, BLOOD (ROUTINE X 2)     Status: Normal (  Preliminary result)   Collection Time   01/11/12  9:18 PM      Component Value Range Status Comment   Specimen Description BLOOD LEFT ANTECUBITAL   Final    Special Requests BOTTLES DRAWN AEROBIC AND ANAEROBIC 4CC EACH   Final    Culture  Setup Time 01/12/2012 15:26   Final    Culture     Final    Value:        BLOOD CULTURE RECEIVED NO GROWTH TO DATE CULTURE WILL BE HELD FOR 5 DAYS BEFORE ISSUING A FINAL NEGATIVE REPORT   Report Status PENDING   Incomplete   CLOSTRIDIUM DIFFICILE BY PCR     Status: Abnormal   Collection Time   01/12/12 12:56 PM      Component Value Range Status Comment   C difficile by pcr POSITIVE (*) NEGATIVE Final      Studies: No results found.  Scheduled Meds:    . albuterol  2.5 mg Nebulization Q6H WA  . budesonide  0.5 mg Nebulization BID  . cholecalciferol  1,200 Units Oral Daily  . enoxaparin (LOVENOX) injection  60 mg Subcutaneous Daily  . famotidine  20 mg Oral BID  . furosemide  20 mg Oral Daily  . losartan  25 mg Oral Daily  . meropenem (MERREM) IV  1 g Intravenous Q12H  . methocarbamol  500 mg Oral BID  . metroNIDAZOLE  500 mg Oral Q8H  . [COMPLETED] potassium chloride  60 mEq Oral Once  . predniSONE  60 mg Oral Q breakfast  . tiotropium  18 mcg Inhalation Daily  . [COMPLETED] warfarin  4 mg Oral ONCE-1800  . [COMPLETED] warfarin   Does not apply Once  . Warfarin - Pharmacist Dosing Inpatient   Does not apply q1800  . [DISCONTINUED] enoxaparin (LOVENOX) injection  50 mg Subcutaneous Daily   Continuous Infusions:    Principal Problem:  *Healthcare-associated pneumonia Active Problems:  HYPERTENSION  COPD (chronic obstructive pulmonary disease)  COPD exacerbation  Chronic  respiratory failure  Pleuritic chest pain  SOB (shortness of breath)  Elevated troponin  Diarrhea  C. difficile colitis  PE (pulmonary embolism)    Time spent: 30    Haydan Wedig, [facility]  [facility] Pager [phone]. If 8PM-8AM, please contact night-coverage at [email or web address], password [facility] 01/15/2012, 7:31 PM  LOS: 4 days

## 2012-01-16 LAB — CBC
Hemoglobin: 11.6 g/dL — ABNORMAL LOW (ref 12.0–15.0)
MCH: 31.5 pg (ref 26.0–34.0)
MCHC: 33.3 g/dL (ref 30.0–36.0)
RBC: 3.68 MIL/uL — ABNORMAL LOW (ref 3.87–5.11)
RDW: 13.8 % (ref 11.5–15.5)

## 2012-01-16 LAB — BASIC METABOLIC PANEL
Calcium: 8.1 mg/dL — ABNORMAL LOW (ref 8.4–10.5)
GFR calc Af Amer: >90 mL/min (ref >90)
GFR calc non Af Amer: >90 mL/min (ref >90)
Potassium: 3.8 mEq/L (ref 3.5–5.1)
Sodium: 139 mEq/L (ref 135–145)

## 2012-01-16 MED ORDER — ALBUTEROL SULFATE (5 MG/ML) 0.5% IN NEBU
2.5000 mg | INHALATION_SOLUTION | Freq: Three times a day (TID) | RESPIRATORY_TRACT | Status: DC
  Administered 2012-01-17 (×2): 2.5 mg via RESPIRATORY_TRACT
  Filled 2012-01-16 (×2): qty 0.5

## 2012-01-16 MED ORDER — WARFARIN SODIUM 4 MG PO TABS
4.0000 mg | ORAL_TABLET | Freq: Once | ORAL | Status: AC
  Administered 2012-01-16: 4 mg via ORAL
  Filled 2012-01-16: qty 1

## 2012-01-16 NOTE — Progress Notes (Signed)
ANTICOAGULATION CONSULT NOTE - F/U Consult  Pharmacy Consult for Lovenox/Warfarin Indication: Possible PE  Allergies  Allergen Reactions  . Aspirin Nausea And Vomiting  . Codeine Hives  . Penicillins Swelling  . Shrimp (Shellfish Allergy) Swelling   Patient Measurements: Height: 5' (152.4 cm) Weight: 82 lb 7.2 oz (37.4 kg) IBW/kg (Calculated) : 45.5   Vital Signs: Temp: 98.2 F (36.8 C) (12/12 0417) Temp src: Oral (12/12 0417) BP: 150/83 mmHg (12/12 0417) Pulse Rate: 115  (12/12 0417)  Labs:  Basename 01/16/12 0520 01/15/12 0443 01/14/12 0902 01/14/12 0503 01/13/12 2220 01/13/12 1135  HGB 11.6* 11.5* -- -- -- --  HCT 34.8* 33.9* -- 37.1 -- --  PLT 554* 513* -- 523* -- --  APTT -- -- -- -- -- --  LABPROT 15.5* 13.8 -- -- -- --  INR 1.25 1.07 -- -- -- --  HEPARINUNFRC -- -- 0.84* -- 0.22* 0.53  CREATININE 0.39* 0.33* -- 0.32* -- --  CKTOTAL -- -- -- -- -- --  CKMB -- -- -- -- -- --  TROPONINI -- -- -- -- -- --   Estimated Creatinine Clearance: 33.7 ml/min (by C-G formula based on Cr of 0.39).  Medications:  Scheduled:     . albuterol  2.5 mg Nebulization Q6H WA  . budesonide  0.5 mg Nebulization BID  . cholecalciferol  1,200 Units Oral Daily  . enoxaparin (LOVENOX) injection  60 mg Subcutaneous Daily  . famotidine  20 mg Oral BID  . furosemide  20 mg Oral Daily  . losartan  25 mg Oral Daily  . meropenem (MERREM) IV  1 g Intravenous Q12H  . methocarbamol  500 mg Oral BID  . metroNIDAZOLE  500 mg Oral Q8H  . predniSONE  50 mg Oral Q breakfast  . tiotropium  18 mcg Inhalation Daily  . [COMPLETED] warfarin  4 mg Oral ONCE-1800  . [COMPLETED] warfarin   Does not apply Once  . Warfarin - Pharmacist Dosing Inpatient   Does not apply q1800  . [DISCONTINUED] enoxaparin (LOVENOX) injection  50 mg Subcutaneous Daily  . [DISCONTINUED] predniSONE  60 mg Oral Q breakfast    Assessment: 23 yoF with c/o worsening cough/pleuritic chest pain. Seen in ED recently w/ PNA,  treated with po Levaquin.  Started on Heparin infusion; r/o PE. Shrimp allergy and poor renal function precluded chest CT.  VQ scan indeterminate for PE, Cardiology following.  Transitioned from IV Heparin to Lovenox/Warfarin 12/10.  INR increased, but still subtherapeutic (1.25) as expected with warfarin initiation.  Concurrent Metronidazole can increase PT/INR  Goal of Therapy:  INR 2-3 Anti-Xa level 0.6-1.2 units/ml 4hrs after LMWH dose given Monitor platelets by anticoagulation protocol: Yes   Plan:   Continue Lovenox 60mg  SQ daily started 12/11 (1.5mg /kg)  Repeat Warfarin 4mg  today  Daily Protime/INR. CBC q72 hr  LMWH level at steady state with poor renal function and low TBW.  Coumadin education: booklet, video, teaching.  Lynann Beaver PharmD, BCPS Pager [phone] 01/16/2012 8:28 AM

## 2012-01-16 NOTE — Progress Notes (Signed)
[facility] PROGRESS NOTE  Bianca [name] OZH:[id number] DOB: [birth year] DOA: 01/11/2012 PCP: [name], MD  Assessment/Plan: 78 yo F presents with healthcare associated pneumonia who was initially started on meropenem and plan to continue for a 7 day course. She had elevated troponins and cardiology was consulted but felt she was not having an NSTEMI. Her ECHO demonstrated new heart failure with EF of 35-40% and focal wall motion abnormalities which may be consistent with MI versus Takotsubo cardiomyopathy. She had a moderate probability V/Q scan and does not want to have a CT done to verify diagnosis because she was told by her allergist to never had contrast. She also has COPD exacerbation and has been on a steroid taper. She has some anxiety, but seems to be coping well. Will likely need home oxygen. Home in 2-3 days.   Healthcare-associated pneumonia: Acute respiratory failure with hypoxia.  - Continue meropenem day 5, will finish therapy in am. May need oxygen at home.  - BCx2 12/7 NGTD  - Sputum culture pending collection  - Legionella Ag neg  - Step pneumo Ag neg  - Continue oxygen prn, oxygen saturations in high 80s to low 90s on 2L [place]  - mucinex DM and tessalon discontinued because patient concerned that they gave her diarrhea  - Pulmonology has recommended a dry CT scan chest to evaluate underlying lung disease as outpatient   Chest pain: Most likely related to pneumonia versus PE per cardiology. She had elevated troponins at admission and has evidence of wall motion abnormality on her ECHO with systolic heart failure which is new.  - Appreciate cardiology recommendations  - Patient does not want CT scan of chest to rule out PE because she has severe shrimp allergy and her allergist told her to never have IV contrast because it could kill her.   Presumptive PE:  - Continue coumadin day 3 and lovenox   CHF: Possible ICM versus takotsubo cardiomyopathy. EF of 35-40%  -  No further evaluation per cardiology  - Patient on lasix and ARB. No BB due to COPD   COPD exacerbation, diminished with high pitched full expiratory wheeze today  - Albuterol scheduled and prn  - Spiriva  - Wean prednisone to 50mg  daily  - Continue budesonide   C diff diarrhea: Diarrhea has resolved.  - Flagyl day 4 to continue for 14 day course   Hypertension: Blood pressures stable. Continue losartan   Leukocytosis, trending down on antibiotics, trend WBC   Acute normocytic anemia, likely due to IVF and marrow suppression from acute infection, hgb stable  Thrombocytosis, likely elevated as acute phase reactant and stable. Trend   Hypokalemia, likely due to poor po intake. patient prefers oral solution KCl   DIET: Healthy heart diet  ACCESS: PIV  IVF: OFF  PROPH: Lovenox and coumadin   Code Status: Full code  Family Communication: spoke to patient who was alone at bedside  Disposition Plan: pending completion of 7 days of IV antibiotics    Consultants:  Cardiology  Pulmonary critical care   Procedures:  V/Q 12/8  ECHO  CXR   Antibiotics:  Vancomycin x 1 12/7  Meropenem 12/7 >>   HPI/Subjective: Has loose stools. No other issues noted.   Objective: Filed Vitals:   01/16/12 1200 01/16/12 1352 01/16/12 1529 01/16/12 1811  BP:  94/57  98/51  Pulse:  99  102  Temp:  98.1 F (36.7 C)  98.7 F (37.1 C)  TempSrc:  Oral  Oral  Resp:  20  19  Height:      Weight:      SpO2: 93% 95% 94% 94%    Intake/Output Summary (Last 24 hours) at 01/16/12 1918 Last data filed at 01/16/12 1906  Gross per 24 hour  Intake    880 ml  Output    800 ml  Net     80 ml   Filed Weights   01/12/12 0100 01/15/12 0543 01/16/12 0417  Weight: 38.3 kg (84 lb 7 oz) 39.2 kg (86 lb 6.7 oz) 37.4 kg (82 lb 7.2 oz)    Exam: General: Thin CF, NAD, stable.  HEENT: MMM  Cardiovascular: tachycardic, no m/r/g Respiratory: Bronchial BS and diminished bilaterally with wheezes  Abdomen:   BS+, soft, nondistended, nontender  MSK: No LEE  Data Reviewed: Basic Metabolic Panel:  Lab 01/16/12 1914 01/15/12 0443 01/14/12 0503 01/13/12 0205 01/12/12 0641  NA 139 135 135 135 134*  K 3.8 3.2* 4.1 3.2* 3.2*  CL 101 101 101 100 98  CO2 32 28 26 26 26   GLUCOSE 78 95 140* 96 90  BUN 6 9 4* 3* 4*  CREATININE 0.39* 0.33* 0.32* 0.38* 0.37*  CALCIUM 8.1* 7.9* 7.6* 7.4* 7.8*  MG -- -- -- -- --  PHOS -- -- -- -- --   Liver Function Tests:  Lab 01/11/12 2030  AST 31  ALT 17  ALKPHOS 106  BILITOT 0.3  PROT 6.9  ALBUMIN 2.7*   No results found for this basename: LIPASE:5,AMYLASE:5 in the last 168 hours No results found for this basename: AMMONIA:5 in the last 168 hours CBC:  Lab 01/16/12 0520 01/15/12 0443 01/14/12 0503 01/13/12 0205 01/12/12 0825 01/11/12 2030  WBC 8.8 12.0* 14.1* 14.1* 16.0* --  NEUTROABS -- -- -- -- -- 15.7*  HGB 11.6* 11.5* 12.3 11.7* 11.9* --  HCT 34.8* 33.9* 37.1 34.8* 35.8* --  MCV 94.6 92.9 94.4 94.8 93.5 --  PLT 554* 513* 523* 483* 480* --   Cardiac Enzymes:  Lab 01/12/12 1210 01/12/12 0641 01/12/12 0040  CKTOTAL 59 63 66  CKMB 5.3* 5.7* 5.9*  CKMBINDEX -- -- --  TROPONINI 0.40* 0.63* 1.41*   BNP (last 3 results)  Basename 01/13/12 1942  PROBNP 2922.0*   CBG: No results found for this basename: GLUCAP:5 in the last 168 hours  Recent Results (from the past 240 hour(s))  CULTURE, BLOOD (ROUTINE X 2)     Status: Normal (Preliminary result)   Collection Time   01/11/12  8:30 PM      Component Value Range Status Comment   Specimen Description BLOOD RIGHT ARM   Final    Special Requests BOTTLES DRAWN AEROBIC AND ANAEROBIC    Final    Culture  Setup Time 01/12/2012 15:26   Final    Culture     Final    Value:        BLOOD CULTURE RECEIVED NO GROWTH TO DATE CULTURE WILL BE HELD FOR 5 DAYS BEFORE ISSUING A FINAL NEGATIVE REPORT   Report Status PENDING   Incomplete   CULTURE, BLOOD (ROUTINE X 2)     Status: Normal (Preliminary result)    Collection Time   01/11/12  9:18 PM      Component Value Range Status Comment   Specimen Description BLOOD LEFT ANTECUBITAL   Final    Special Requests BOTTLES DRAWN AEROBIC AND ANAEROBIC [facility]   Final    Culture  Setup Time 01/12/2012 15:26   Final  Culture     Final    Value:        BLOOD CULTURE RECEIVED NO GROWTH TO DATE CULTURE WILL BE HELD FOR 5 DAYS BEFORE ISSUING A FINAL NEGATIVE REPORT   Report Status PENDING   Incomplete   CLOSTRIDIUM DIFFICILE BY PCR     Status: Abnormal   Collection Time   01/12/12 12:56 PM      Component Value Range Status Comment   C difficile by pcr POSITIVE (*) NEGATIVE Final      Studies: No results found.  Scheduled Meds:   . albuterol  2.5 mg Nebulization Q6H WA  . budesonide  0.5 mg Nebulization BID  . cholecalciferol  1,200 Units Oral Daily  . enoxaparin (LOVENOX) injection  60 mg Subcutaneous Daily  . famotidine  20 mg Oral BID  . furosemide  20 mg Oral Daily  . losartan  25 mg Oral Daily  . meropenem (MERREM) IV  1 g Intravenous Q12H  . methocarbamol  500 mg Oral BID  . metroNIDAZOLE  500 mg Oral Q8H  . predniSONE  50 mg Oral Q breakfast  . tiotropium  18 mcg Inhalation Daily  . Warfarin - Pharmacist Dosing Inpatient   Does not apply q1800   Continuous Infusions:   Principal Problem:  *Healthcare-associated pneumonia Active Problems:  HYPERTENSION  COPD (chronic obstructive pulmonary disease)  COPD exacerbation  Chronic respiratory failure  Pleuritic chest pain  SOB (shortness of breath)  Elevated troponin  Diarrhea  C. difficile colitis  PE (pulmonary embolism)    Time spent: 35 min   Myrtice Lowdermilk V.  [facility] Pager [phone]. If 8PM-8AM, please contact night-coverage at [email or web address], password [facility] 01/16/2012, 7:18 PM  LOS: 5 days

## 2012-01-17 LAB — CBC
MCH: 30.7 pg (ref 26.0–34.0)
MCV: 94.7 fL (ref 78.0–100.0)
Platelets: 492 10*3/uL — ABNORMAL HIGH (ref 150–400)
RBC: 3.58 MIL/uL — ABNORMAL LOW (ref 3.87–5.11)

## 2012-01-17 LAB — BASIC METABOLIC PANEL
BUN: 7 mg/dL (ref 6–23)
CO2: 33 mEq/L — ABNORMAL HIGH (ref 19–32)
Calcium: 7.7 mg/dL — ABNORMAL LOW (ref 8.4–10.5)
Glucose, Bld: 69 mg/dL — ABNORMAL LOW (ref 70–99)
Sodium: 135 mEq/L (ref 135–145)

## 2012-01-17 MED ORDER — POTASSIUM CHLORIDE 20 MEQ/15ML (10%) PO LIQD: 20.0000 meq | Freq: Every day | ORAL | Status: DC

## 2012-01-17 MED ORDER — ENOXAPARIN SODIUM 60 MG/0.6ML ~~LOC~~ SOLN: 60.0000 mg | Freq: Every day | SUBCUTANEOUS | Status: DC

## 2012-01-17 MED ORDER — WARFARIN SODIUM 4 MG PO TABS: 4.0000 mg | ORAL_TABLET | Freq: Every day | ORAL | Status: DC

## 2012-01-17 MED ORDER — WARFARIN SODIUM 4 MG PO TABS
4.0000 mg | ORAL_TABLET | Freq: Once | ORAL | Status: AC
  Administered 2012-01-17: 4 mg via ORAL
  Filled 2012-01-17: qty 1

## 2012-01-17 MED ORDER — METRONIDAZOLE 500 MG PO TABS: 500.0000 mg | ORAL_TABLET | Freq: Three times a day (TID) | ORAL | Status: DC

## 2012-01-17 MED ORDER — PREDNISONE 20 MG PO TABS: ORAL_TABLET | ORAL | Status: DC

## 2012-01-17 MED ORDER — FUROSEMIDE 20 MG PO TABS: 20.0000 mg | ORAL_TABLET | Freq: Every day | ORAL | Status: DC

## 2012-01-17 MED ORDER — POTASSIUM CHLORIDE 20 MEQ/15ML (10%) PO LIQD
20.0000 meq | Freq: Two times a day (BID) | ORAL | Status: DC
  Administered 2012-01-17: 20 meq via ORAL
  Filled 2012-01-17 (×2): qty 15

## 2012-01-17 NOTE — Progress Notes (Signed)
SATURATION QUALIFICATIONS: (This note is used to comply with regulatory documentation for home oxygen)  Patient Saturations on Room Air at Rest = 88%  Patient Saturations on Room Air while Ambulating = 83%  Patient Saturations on 3 Liters of oxygen while Ambulating = 92%  Please briefly explain why patient needs home oxygen: Patient's oxygen level should be greater than 90%.

## 2012-01-17 NOTE — Progress Notes (Signed)
Pt's O2 sats ambulating on room air were 83%. Replaced back on O2 and sats returned to 92%. Julio Sicks RN

## 2012-01-17 NOTE — Progress Notes (Signed)
Please document sats as below per Medicare Guidelines   SATURATION QUALIFICATIONS: (This note is used to comply with regulatory documentation for home oxygen)  Patient Saturations on Room Air at Rest = ___%  Patient Saturations on Room Air while Ambulating = ___%  Patient Saturations on ___ Liters of oxygen while Ambulating = ___%  Please briefly explain why patient needs home oxygen:

## 2012-01-17 NOTE — Progress Notes (Signed)
Physical Therapy Treatment Patient Details Name: Bianca [name] MRN: [id number] DOB: 05-19-1930 Today's Date: 01/17/2012 Time: 2841-3244 PT Time Calculation (min): 11 min  PT Assessment / Plan / Recommendation Comments on Treatment Session  Pt ambulated in room earlier with RN however very agreeable to ambulate longer distance in hallway; remained on 3L oxygen throughout and reports better breathing and feeling stronger in LEs.  Did not perform any exercises due to pt's lunch arriving upon returning to room.    Follow Up Recommendations  Home health PT;Supervision for mobility/OOB     Does the patient have the potential to tolerate intense rehabilitation     Barriers to Discharge        Equipment Recommendations  None recommended by PT    Recommendations for Other Services    Frequency     Plan Discharge plan remains appropriate;Frequency remains appropriate    Precautions / Restrictions Precautions Precaution Comments: requires oxygen   Pertinent Vitals/Pain No pain reported, SaO2 88% after ambulating on 3L and HR 124    Mobility  Bed Mobility Bed Mobility: Supine to Sit Supine to Sit: 6: Modified independent (Device/Increase time) Transfers Transfers: Sit to Stand;Stand to Sit Sit to Stand: 5: Supervision;With upper extremity assist Stand to Sit: 5: Supervision;With upper extremity assist Ambulation/Gait Ambulation/Gait Assistance: 5: Supervision Ambulation Distance (Feet): 180 Feet Assistive device: None Ambulation/Gait Assistance Details: pt ambulated in room with RN earlier today and requires oxygen, ambulated on 3L [place] and SaO2 88% and HR 124 upon return to room with pt reporting this is her usual, pt reports breathing better and stronger LEs today Gait Pattern: Decreased stride length Gait velocity: decreased    Exercises     PT Diagnosis:    PT Problem List:   PT Treatment Interventions:     PT Goals Acute Rehab PT Goals PT Goal: Sit to Stand - Progress:  Progressing toward goal PT Goal: Stand to Sit - Progress: Progressing toward goal PT Goal: Ambulate - Progress: Progressing toward goal  Visit Information  Last PT Received On: 01/17/12 Assistance Needed: +1    Subjective Data  Subjective: I just ordered my lunch.   Cognition  Overall Cognitive Status: Appears within functional limits for tasks assessed/performed    Balance     End of Session PT - End of Session Equipment Utilized During Treatment: Oxygen Activity Tolerance: Patient limited by fatigue Patient left: in bed;with call bell/phone within reach   GP     [facility] E 01/17/2012, 2:12 PM Pager: [phone]

## 2012-01-17 NOTE — Progress Notes (Signed)
ANTICOAGULATION CONSULT NOTE - F/U Consult  Pharmacy Consult for Lovenox/Warfarin Indication: Presumed PE  Allergies  Allergen Reactions  . Aspirin Nausea And Vomiting  . Codeine Hives  . Penicillins Swelling  . Shrimp (Shellfish Allergy) Swelling   Patient Measurements: Height: 5' (152.4 cm) Weight: 79 lb 14.4 oz (36.242 kg) IBW/kg (Calculated) : 45.5   Vital Signs: Temp: 98.6 F (37 C) (12/13 0519) Temp src: Oral (12/13 0519) BP: 118/68 mmHg (12/13 0519) Pulse Rate: 94  (12/13 0519)  Labs:  Basename 01/17/12 0457 01/16/12 0520 01/15/12 0443 01/14/12 0902  HGB 11.0* 11.6* -- --  HCT 33.9* 34.8* 33.9* --  PLT 492* 554* 513* --  APTT -- -- -- --  LABPROT 19.5* 15.5* 13.8 --  INR 1.71* 1.25 1.07 --  HEPARINUNFRC -- -- -- 0.84*  CREATININE 0.36* 0.39* 0.33* --  CKTOTAL -- -- -- --  CKMB -- -- -- --  TROPONINI -- -- -- --   Estimated Creatinine Clearance: 32.6 ml/min (by C-G formula based on Cr of 0.36).  Medications:  Scheduled:     . albuterol  2.5 mg Nebulization TID  . budesonide  0.5 mg Nebulization BID  . cholecalciferol  1,200 Units Oral Daily  . enoxaparin (LOVENOX) injection  60 mg Subcutaneous Daily  . famotidine  20 mg Oral BID  . furosemide  20 mg Oral Daily  . losartan  25 mg Oral Daily  . methocarbamol  500 mg Oral BID  . metroNIDAZOLE  500 mg Oral Q8H  . predniSONE  50 mg Oral Q breakfast  . tiotropium  18 mcg Inhalation Daily  . Warfarin - Pharmacist Dosing Inpatient   Does not apply q1800    Assessment: 30 yoF with c/o worsening cough/pleuritic chest pain. Seen in ED recently w/ PNA, treated with po Levaquin.  Started on Heparin infusion; r/o PE. Shrimp allergy and poor renal function precluded chest CT.  VQ scan indeterminate for PE, Cardiology following.  Transitioned from IV Heparin to Lovenox/Warfarin 12/10.  CBC stable, no bleeding reported.  INR now responding, but still subtherapeutic (1.71)    Concurrent Metronidazole can  increase PT/INR  Goal of Therapy:  INR 2-3 For 1.5mg /kg/day dose, goal peak anti-xa level of 1-2 Monitor platelets by anticoagulation protocol: Yes   Plan:   Continue Lovenox 60mg  SQ daily started 12/11 (1.5mg /kg)   Repeat Warfarin 4mg  today  Daily Protime/INR. CBC q72 hr  LMWH level today with poor renal function and low TBW.  Coumadin education completed 12/12  Loralee Pacas, PharmD, BCPS Pager: [phone] 01/17/2012 9:01 AM

## 2012-01-17 NOTE — Discharge Summary (Signed)
Physician Discharge Summary  Bianca [name] NWG:[id number] DOB: 1930-07-03 DOA: 01/11/2012  PCP: [name], MD  Admit date: 01/11/2012 Discharge date: 01/17/2012  Recommendations for Outpatient Follow-up: PCP f/u in one week. Will need to get INR checked on 12/14 Saturday and 12/16 Monday  Discharge Diagnoses:  Principal Problem:  *Healthcare-associated pneumonia Active Problems:  HYPERTENSION  COPD (chronic obstructive pulmonary disease)  Chronic respiratory failure  Elevated troponin  C. difficile colitis  PE (pulmonary embolism)  Hypokalemia  COPD exacerbation  Pleuritic chest pain  SOB (shortness of breath)  Diarrhea   Discharge Condition: stable  Diet recommendation: cardiac  Filed Weights   01/15/12 0543 01/16/12 0417 01/17/12 0519  Weight: 39.2 kg (86 lb 6.7 oz) 37.4 kg (82 lb 7.2 oz) 36.242 kg (79 lb 14.4 oz)    History of present illness:  Bianca [name] is a 78 y.o. female who presents to the ED with complaints of worsening Cough and Pleuritic Left sided Chest Pain over the past 24 hours. She was diagnosed with pneumonia 5 days ago and was discharged form the ED on Levaquin.   Hospital Course:  78 yo F presents with healthcare associated pneumonia who was initially started on meropenem and continued for 5 days. Cx were not obtained as she did not have any sputum production. Her cough was dry. She had elevated troponins and cardiology was consulted but felt she was not having an NSTEMI. Her ECHO demonstrated new heart failure with EF of 35-40% and focal wall motion abnormalities which may be consistent with MI versus Takotsubo cardiomyopathy. She had a moderate probability V/Q scan and does not want to have a CT done to verify diagnosis because she was told by her allergist to never had contrast. She also has COPD exacerbation and has been on a steroid taper and continue it at home. She has some anxiety, but seems to be coping well. She will start home oxygen. She  becomes hypoxic on ambulation and now requiring oxygen. -Her BCx2 12/7 were negative. Legionella Ag and strep pneumo Ag neg. mucinex DM and tessalon discontinued because patient concerned that they gave her diarrhea - Pulmonology has recommended a dry CT scan chest to evaluate underlying lung disease as outpatient  Chest pain: Most likely related to pneumonia versus PE per cardiology. She had elevated troponins at admission and has evidence of wall motion abnormality on her ECHO with systolic heart failure which is new. She has been started on presumptive PE treatment and will continue coumadin with dose adjustment by primary care and 3 more days of [name] lovenox at home. She is anxious to go home today and will start HHC at home.  Patient is on lasix and ARB. No BB due to COPD  COPD exacerbation was treated with albuterol scheduled and prn, spiriva, tapering steroids and  Budesonide. C diff diarrhea: Diarrhea has resolved. She will continue po Flagyl to finish 14 day course.  Hypertension: Blood pressures stable. Continued losartan.  Acute normocytic anemia, likely due to IVF and marrow suppression from acute infection, hgb 11 today  Hypokalemia, likely due to poor po intake. patient prefers oral KCL and she will continue it at home.    Consultants:  Cardiology  Pulmonary critical care Procedures:  V/Q 12/8  ECHO  CXR Antibiotics:  Vancomycin x 1 12/7  Meropenem 12/7 >>    Discharge Exam: Filed Vitals:   01/17/12 0154 01/17/12 0519 01/17/12 0846 01/17/12 0900  BP: 125/75 118/68  109/62  Pulse: 93 94  94  Temp: 98.2 F (36.8 C) 98.6 F (37 C)  98.6 F (37 C)  TempSrc: Oral Oral  Oral  Resp: 20 20  18   Height:      Weight:  36.242 kg (79 lb 14.4 oz)    SpO2: 96% 96% 91% 95%    General: A, Ox 3, NAD Cardiovascular: S1S2 regular, no m/r/g Respiratory: wheezes and GBAE  Discharge Instructions  Discharge Orders    Future Appointments: Provider: Department: Dept Phone: Center:    01/30/2012 11:00 AM Nyoka Cowden, MD [facility] [phone] None   02/10/2012 10:00 AM [facility] [phone] None       Medication List     As of 01/17/2012  1:32 PM    STOP taking these medications         levofloxacin 750 MG tablet   Commonly known as: LEVAQUIN      TAKE these medications         albuterol 108 (90 BASE) MCG/ACT inhaler   Commonly known as: PROVENTIL HFA;VENTOLIN HFA   Inhale 2 puffs into the lungs every 4 (four) hours as needed for wheezing or shortness of breath.      benzonatate 200 MG capsule   Commonly known as: TESSALON   Take 200 mg by mouth 3 (three) times daily as needed. Cough      denosumab 60 MG/ML Soln injection   Commonly known as: PROLIA   Inject 60 mg into the skin every 6 (six) months. Administer in upper arm, thigh, or abdomen. Dr Hoy Register office      enoxaparin 60 MG/0.6ML injection   Commonly known as: LOVENOX   Inject 0.6 mLs (60 mg total) into the skin daily.      famotidine 20 MG tablet   Commonly known as: PEPCID   One at bedtime      furosemide 20 MG tablet   Commonly known as: LASIX   Take 1 tablet (20 mg total) by mouth daily.      losartan 50 MG tablet   Commonly known as: COZAAR   Take 25 mg by mouth daily.      methocarbamol 500 MG tablet   Commonly known as: ROBAXIN   Take 500 mg by mouth 2 (two) times daily.      metroNIDAZOLE 500 MG tablet   Commonly known as: FLAGYL   Take 1 tablet (500 mg total) by mouth every 8 (eight) hours.      mometasone-formoterol 100-5 MCG/ACT Aero   Commonly known as: DULERA   2 puffs. Take 2 puffs first thing in am and then another 2 puffs about 12 hours later.      NON FORMULARY   Adult Gummy multivitamin take 2 daily      NYQUIL 60-7.07-03-998 MG/30ML Liqd   Generic drug: Pseudoeph-Doxylamine-DM-APAP   Take 30 mLs by mouth as needed. Cold symptoms      potassium chloride 20 MEQ/15ML (10%) solution   Take 15 mLs (20 mEq  total) by mouth daily.      predniSONE 20 MG tablet   Commonly known as: DELTASONE   Take 20 mg for 4 days, then 10 mg for 4 days and then 5 mg for 4 days and then d/c      tiotropium 18 MCG inhalation capsule   Commonly known as: SPIRIVA   Place 18 mcg into inhaler and inhale daily.      traMADol 50 MG tablet   Commonly known as:  ULTRAM   Take 50 mg by mouth every 6 (six) hours as needed. Pain      vitamin D (CHOLECALCIFEROL) 400 UNITS tablet   Take 1,200 Units by mouth daily.      warfarin 4 MG tablet   Commonly known as: COUMADIN   Take 1 tablet (4 mg total) by mouth daily.           Follow-up Information    Follow up with [name], MD. Schedule an appointment as soon as possible for a visit in 1 week. (check INR on saturday 12/14 and then on monday. )    Contact information:   [address] [place] [address] [phone]           The results of significant diagnostics from this hospitalization (including imaging, microbiology, ancillary and laboratory) are listed below for reference.    Significant Diagnostic Studies: Dg Chest 2 View  01/11/2012  *RADIOLOGY REPORT*  Clinical Data: Cough, shortness of breath and chest congestion.  CHEST - 2 VIEW  Comparison: 01/06/2012.  Findings: Normal sized heart.  Multifocal patchy airspace opacity in both lungs with improvement in the lower lung zones and mild progression in the mid lung zones.  Minimal bilateral pleural fluid with improvement.  Stable lower thoracic vertebral compression deformities and kyphoplasty material.  Stable diffuse peribronchial thickening, accentuation of the interstitial markings and hyperexpansion of the lungs.  Old bilateral rib fractures.  IMPRESSION:  1.  Mild overall improvement in bilateral pneumonia. 2.  Stable changes of COPD and chronic bronchitis.   Original Report Authenticated By: Beckie Salts, M.D.    Dg Chest 2 View  01/06/2012  *RADIOLOGY REPORT*   Clinical Data: Upper chest and neck pain.  Shortness of breath. Cough with congestion.  History COPD and hypertension.  CHEST - 2 VIEW  Comparison: 09/30/2011  Findings: Underlying hyperinflation.  Osteopenia with prior vertebral augmentation at multiple levels in the lower thoracic spine.  Bilateral rib fractures which are nonacute.  There is a nonhealed sixth posterolateral left rib fracture.  Midline trachea.  Normal heart size.  Cannot exclude small right pleural effusion. No pneumothorax.  Lower lobe predominant interstitial thickening which is moderate to marked.  Superimposed patchy right and likely left base air space opacities.  IMPRESSION:  1.  Multifocal right greater than left lower lobe airspace disease, suspicious for infection. 2. Underlying COPD/chronic bronchitis which is moderate to marked. 3.  Osteopenia with bilateral nonacute rib fractures and vertebral augmentation within the thoracic spine. 4.  Suspect a small right pleural effusion.   Original Report Authenticated By: Jeronimo Greaves, M.D.    Ct Cervical Spine Wo Contrast  01/06/2012  *RADIOLOGY REPORT*  Clinical Data: Neck pain  CT CERVICAL SPINE WITHOUT CONTRAST  Technique:  Multidetector CT imaging of the cervical spine was performed. Multiplanar CT image reconstructions were also generated.  Comparison: None.  Findings: Axial images of the cervical spine shows no acute fracture.  There is no pneumothorax in visualized lung apices. Mild emphysematous changes are noted lung apices.  Computer processed images shows no acute fracture.  Degenerative changes noted at C1 C2 articulation.  There is pannus formation surrounding the C2 odontoid.  There is mild disc space flattening at C3-C4 level.  Mild anterolisthesis about 2 mm noted C4 on C5 vertebral body.  Disc space flattening with mild anterior and mild posterior spurring at C4-C5 C5-C6 level.  Mild disc space flattening with mild anterior spurring and mild  disc bulge at C6-C7 level.  No  prevertebral soft tissue swelling.  Cervical airway is patent.  IMPRESSION: No acute fracture.  Probable chronic mild anterolisthesis C4 on C5 vertebral body.  Multilevel degenerative changes as described above.  No prevertebral soft tissue swelling.   Original Report Authenticated By: Natasha Mead, M.D.    Nm Pulmonary Perf And Vent  01/12/2012  *RADIOLOGY REPORT*  Clinical Data:  78 year old female with chest pain, shortness of breath and elevated D-dimer.  NUCLEAR MEDICINE VENTILATION - PERFUSION LUNG SCAN  Technique:  Ventilation images were obtained in multiple projections using inhaled aerosol technetium 99 M DTPA.  Perfusion images were obtained in multiple projections after intravenous injection of Tc-19m MAA.  Radiopharmaceuticals:  Tc-53m DTPA aerosol and 5 mCi Tc-67m MAA.  Comparison: 01/11/2012 chest radiograph  Findings:  Ventilation:  This is a suboptimal ventilation study with clumping of agent within the lungs.  Multiple scattered areas of decreased ventilation are noted, some which may be artifactual.  Perfusion:  Multiple scattered bilateral perfusion defects are identified, primarily in the mid and upper lungs.  Many of the perfusion defects appear to match ventilation defects.  IMPRESSION: Indeterminate probability for pulmonary embolus (20-80%).   Original Report Authenticated By: Harmon Pier, M.D.     Microbiology: Recent Results (from the past 240 hour(s))  CULTURE, BLOOD (ROUTINE X 2)     Status: Normal (Preliminary result)   Collection Time   01/11/12  8:30 PM      Component Value Range Status Comment   Specimen Description BLOOD RIGHT ARM   Final    Special Requests BOTTLES DRAWN AEROBIC AND ANAEROBIC    Final    Culture  Setup Time 01/12/2012 15:26   Final    Culture     Final    Value:        BLOOD CULTURE RECEIVED NO GROWTH TO DATE CULTURE WILL BE HELD FOR 5 DAYS BEFORE ISSUING A FINAL NEGATIVE REPORT   Report Status PENDING   Incomplete   CULTURE, BLOOD (ROUTINE X  2)     Status: Normal (Preliminary result)   Collection Time   01/11/12  9:18 PM      Component Value Range Status Comment   Specimen Description BLOOD LEFT ANTECUBITAL   Final    Special Requests BOTTLES DRAWN AEROBIC AND ANAEROBIC 4CC EACH   Final    Culture  Setup Time 01/12/2012 15:26   Final    Culture     Final    Value:        BLOOD CULTURE RECEIVED NO GROWTH TO DATE CULTURE WILL BE HELD FOR 5 DAYS BEFORE ISSUING A FINAL NEGATIVE REPORT   Report Status PENDING   Incomplete   CLOSTRIDIUM DIFFICILE BY PCR     Status: Abnormal   Collection Time   01/12/12 12:56 PM      Component Value Range Status Comment   C difficile by pcr POSITIVE (*) NEGATIVE Final      Labs: Basic Metabolic Panel:  Lab 01/17/12 1610 01/16/12 0520 01/15/12 0443 01/14/12 0503 01/13/12 0205  NA 135 139 135 135 135  K 3.2* 3.8 3.2* 4.1 3.2*  CL 98 101 101 101 100  CO2 33* 32 28 26 26   GLUCOSE 69* 78 95 140* 96  BUN 7 6 9  4* 3*  CREATININE 0.36* 0.39* 0.33* 0.32* 0.38*  CALCIUM 7.7* 8.1* 7.9* 7.6* 7.4*  MG -- -- -- -- --  PHOS -- -- -- -- --  Liver Function Tests:  Lab 01/11/12 2030  AST 31  ALT 17  ALKPHOS 106  BILITOT 0.3  PROT 6.9  ALBUMIN 2.7*   No results found for this basename: LIPASE:5,AMYLASE:5 in the last 168 hours No results found for this basename: AMMONIA:5 in the last 168 hours CBC:  Lab 01/17/12 0457 01/16/12 0520 01/15/12 0443 01/14/12 0503 01/13/12 0205 01/11/12 2030  WBC 7.9 8.8 12.0* 14.1* 14.1* --  NEUTROABS -- -- -- -- -- 15.7*  HGB 11.0* 11.6* 11.5* 12.3 11.7* --  HCT 33.9* 34.8* 33.9* 37.1 34.8* --  MCV 94.7 94.6 92.9 94.4 94.8 --  PLT 492* 554* 513* 523* 483* --   Cardiac Enzymes:  Lab 01/12/12 1210 01/12/12 0641 01/12/12 0040  CKTOTAL 59 63 66  CKMB 5.3* 5.7* 5.9*  CKMBINDEX -- -- --  TROPONINI 0.40* 0.63* 1.41*   BNP: BNP (last 3 results)  Basename 01/13/12 1942  PROBNP 2922.0*   CBG: No results found for this basename: GLUCAP:5 in the last 168  hours  Time coordinating discharge: 1 hour Signed:  Jonika Critz V.  [facility] 01/17/2012, 1:32 PM

## 2012-01-17 NOTE — Progress Notes (Signed)
Talked to patient with spouse present; PCP is Dr Felicity Coyer Pulmonologist is Dr Sherene Sires; patient stated that she has scales at home and weighs herself daily and knows how to take her medication; Patient is agreeable to Home health care services, options given - patient/ spouse chose [facility]; Norberta Keens RN with [facility] called for their Disease Management program for pneumonia/ CHF; room air sats 83%- home 02 ordered-[employer] with [facility] called; Information given to patient on private duty sitters and life alert; B Ave Filter RN,BSN,MHA

## 2012-01-17 NOTE — Progress Notes (Deleted)
[facility] PROGRESS NOTE  Bianca [name] ZOX:[id number] DOB: [birth year] DOA: 01/11/2012 PCP: [name], MD  Assessment/Plan: 78 yo F presents with healthcare associated pneumonia who was initially started on meropenem and plan to continue for a 7 day course. She had elevated troponins and cardiology was consulted but felt she was not having an NSTEMI. Her ECHO demonstrated new heart failure with EF of 35-40% and focal wall motion abnormalities which may be consistent with MI versus Takotsubo cardiomyopathy. She had a moderate probability V/Q scan and does not want to have a CT done to verify diagnosis because she was told by her allergist to never had contrast. She also has COPD exacerbation and has been on a steroid taper. She has some anxiety, but seems to be coping well. Will likely need home oxygen. Home in 2-3 days.   Healthcare-associated pneumonia: Acute respiratory failure with hypoxia.  - status post meropenem x 5 days, May need oxygen at home.  - BCx2 12/7 NGTD  - Sputum culture pending collection  - Legionella Ag neg  - Step pneumo Ag neg  - Continue oxygen prn, oxygen saturations in high 80s to low 90s on 2L [place]  - mucinex DM and tessalon discontinued because patient concerned that they gave her diarrhea  - Pulmonology has recommended a dry CT scan chest to evaluate underlying lung disease as outpatient   Chest pain: Most likely related to pneumonia versus PE per cardiology. She had elevated troponins at admission and has evidence of wall motion abnormality on her ECHO with systolic heart failure which is new.  - Appreciate cardiology recommendations  - Patient does not want CT scan of chest to rule out PE because she has severe shrimp allergy and her allergist told her to never have IV contrast because it could kill her.   Presumptive PE:  - Continue coumadin day 3 and lovenox, can go home lovenox injections and coumadin dosing planning with f/u with PCP if they can  INR on Monday.   CHF: Possible ICM versus takotsubo cardiomyopathy. EF of 35-40%  - No further evaluation per cardiology  - Patient on lasix and ARB. No BB due to COPD   COPD exacerbation, improving  - Albuterol scheduled and prn  - Spiriva  - Wean prednisone to 50mg  daily and taper further - Continue budesonide   C diff diarrhea: Diarrhea has resolved.  - Flagyl day 5 to continue for 14 day course   Hypertension: Blood pressures stable. Continue losartan   Leukocytosis, resolved  Acute normocytic anemia, likely due to IVF and marrow suppression from acute infection, hgb 11 today  Thrombocytosis, likely elevated as acute phase reactant and stable. Trend   Hypokalemia, likely due to poor po intake. patient prefers oral solution KCl, will start daily and discharge her on it.   DIET: Healthy heart diet  ACCESS: PIV  IVF: OFF  PROPH: Lovenox and coumadin   Code Status: Full code  Family Communication: spoke to patient who was alone at bedside  Disposition Plan: can go home on [facility] today or tomorrow  Consultants:  Cardiology  Pulmonary critical care   Procedures:  V/Q 12/8  ECHO  CXR   Antibiotics:  Vancomycin x 1 12/7  Meropenem 12/7 >>   HPI/Subjective: Has loose stools. No other issues noted.    Objective: Filed Vitals:   01/17/12 0154 01/17/12 0519 01/17/12 0846 01/17/12 0900  BP: 125/75 118/68  109/62  Pulse: 93 94  94  Temp: 98.2 F (36.8 C)  98.6 F (37 C)  98.6 F (37 C)  TempSrc: Oral Oral  Oral  Resp: 20 20  18   Height:      Weight:  36.242 kg (79 lb 14.4 oz)    SpO2: 96% 96% 91% 95%    Intake/Output Summary (Last 24 hours) at 01/17/12 1113 Last data filed at 01/17/12 0900  Gross per 24 hour  Intake    840 ml  Output   1126 ml  Net   -286 ml   Filed Weights   01/15/12 0543 01/16/12 0417 01/17/12 0519  Weight: 39.2 kg (86 lb 6.7 oz) 37.4 kg (82 lb 7.2 oz) 36.242 kg (79 lb 14.4 oz)    Exam: General: Thin CF, NAD, stable.  HEENT: MMM   Cardiovascular: tachycardic, no m/r/g  Respiratory: Bronchial BS and diminished bilaterally with wheezes  Abdomen: BS+, soft, nondistended, nontender  MSK: No LEE  Data Reviewed: Basic Metabolic Panel:  Lab 01/17/12 4782 01/16/12 0520 01/15/12 0443 01/14/12 0503 01/13/12 0205  NA 135 139 135 135 135  K 3.2* 3.8 3.2* 4.1 3.2*  CL 98 101 101 101 100  CO2 33* 32 28 26 26   GLUCOSE 69* 78 95 140* 96  BUN 7 6 9  4* 3*  CREATININE 0.36* 0.39* 0.33* 0.32* 0.38*  CALCIUM 7.7* 8.1* 7.9* 7.6* 7.4*  MG -- -- -- -- --  PHOS -- -- -- -- --   Liver Function Tests:  Lab 01/11/12 2030  AST 31  ALT 17  ALKPHOS 106  BILITOT 0.3  PROT 6.9  ALBUMIN 2.7*   No results found for this basename: LIPASE:5,AMYLASE:5 in the last 168 hours No results found for this basename: AMMONIA:5 in the last 168 hours CBC:  Lab 01/17/12 0457 01/16/12 0520 01/15/12 0443 01/14/12 0503 01/13/12 0205 01/11/12 2030  WBC 7.9 8.8 12.0* 14.1* 14.1* --  NEUTROABS -- -- -- -- -- 15.7*  HGB 11.0* 11.6* 11.5* 12.3 11.7* --  HCT 33.9* 34.8* 33.9* 37.1 34.8* --  MCV 94.7 94.6 92.9 94.4 94.8 --  PLT 492* 554* 513* 523* 483* --   Cardiac Enzymes:  Lab 01/12/12 1210 01/12/12 0641 01/12/12 0040  CKTOTAL 59 63 66  CKMB 5.3* 5.7* 5.9*  CKMBINDEX -- -- --  TROPONINI 0.40* 0.63* 1.41*   BNP (last 3 results)  Basename 01/13/12 1942  PROBNP 2922.0*   CBG: No results found for this basename: GLUCAP:5 in the last 168 hours  Recent Results (from the past 240 hour(s))  CULTURE, BLOOD (ROUTINE X 2)     Status: Normal (Preliminary result)   Collection Time   01/11/12  8:30 PM      Component Value Range Status Comment   Specimen Description BLOOD RIGHT ARM   Final    Special Requests BOTTLES DRAWN AEROBIC AND ANAEROBIC    Final    Culture  Setup Time 01/12/2012 15:26   Final    Culture     Final    Value:        BLOOD CULTURE RECEIVED NO GROWTH TO DATE CULTURE WILL BE HELD FOR 5 DAYS BEFORE ISSUING A FINAL NEGATIVE  REPORT   Report Status PENDING   Incomplete   CULTURE, BLOOD (ROUTINE X 2)     Status: Normal (Preliminary result)   Collection Time   01/11/12  9:18 PM      Component Value Range Status Comment   Specimen Description BLOOD LEFT ANTECUBITAL   Final    Special Requests BOTTLES DRAWN AEROBIC AND  ANAEROBIC 4CC EACH   Final    Culture  Setup Time 01/12/2012 15:26   Final    Culture     Final    Value:        BLOOD CULTURE RECEIVED NO GROWTH TO DATE CULTURE WILL BE HELD FOR 5 DAYS BEFORE ISSUING A FINAL NEGATIVE REPORT   Report Status PENDING   Incomplete   CLOSTRIDIUM DIFFICILE BY PCR     Status: Abnormal   Collection Time   01/12/12 12:56 PM      Component Value Range Status Comment   C difficile by pcr POSITIVE (*) NEGATIVE Final      Studies: No results found.  Scheduled Meds:   . albuterol  2.5 mg Nebulization TID  . budesonide  0.5 mg Nebulization BID  . cholecalciferol  1,200 Units Oral Daily  . enoxaparin (LOVENOX) injection  60 mg Subcutaneous Daily  . famotidine  20 mg Oral BID  . furosemide  20 mg Oral Daily  . losartan  25 mg Oral Daily  . methocarbamol  500 mg Oral BID  . metroNIDAZOLE  500 mg Oral Q8H  . predniSONE  50 mg Oral Q breakfast  . tiotropium  18 mcg Inhalation Daily  . warfarin  4 mg Oral ONCE-1800  . Warfarin - Pharmacist Dosing Inpatient   Does not apply q1800   Continuous Infusions:   Principal Problem:  *Healthcare-associated pneumonia Active Problems:  HYPERTENSION  COPD (chronic obstructive pulmonary disease)  COPD exacerbation  Chronic respiratory failure  Pleuritic chest pain  SOB (shortness of breath)  Elevated troponin  Diarrhea  C. difficile colitis  PE (pulmonary embolism)  Time spent: 35 minutes  Lavaeh Bau V.  [facility] Pager [phone]. If 8PM-8AM, please contact night-coverage at [email or web address], password [facility] 01/17/2012, 11:13 AM  LOS: 6 days

## 2012-01-17 NOTE — Evaluation (Signed)
Occupational Therapy Evaluation Patient Details Name: ROLLANDE THURSBY MRN: [id number] DOB: 1930-10-01 Today's Date: 01/17/2012 Time:  Seen in am for 15 minutes and 15:48 - 15:58 to follow up.  25 minutes total    OT Assessment / Plan / Recommendation Clinical Impression  This 78 year old female was admitted with cough and chest pain.  She was dx'd with pna 5 days prior to admission.  PMH is significant for COPD.  Pt has never worn 02 before and will use this at discharge, which is planned for today.  All education was completed with pt.  She does not need further OT at this time.      OT Assessment  Patient does not need any further OT services    Follow Up Recommendations  No OT follow up    Barriers to Discharge      Equipment Recommendations  None recommended by OT    Recommendations for Other Services    Frequency       Precautions / Restrictions Precautions Precaution Comments: 3 liters 02 Restrictions Weight Bearing Restrictions: No   Pertinent Vitals/Pain No pain.  Sats 86% after walking to bathroom (on 3 liters 02).  Came up to 90 within 2 minutes.      ADL  Grooming: Performed;Set up;Teeth care Where Assessed - Grooming: Unsupported sitting Toilet Transfer: Performed;Min guard (help with 02 line) Toilet Transfer Method: Sit to stand Toilet Transfer Equipment: Comfort height toilet Toileting - Clothing Manipulation and Hygiene: Performed;Min guard Where Assessed - Toileting Clothing Manipulation and Hygiene: Sit to stand from 3-in-1 or toilet Transfers/Ambulation Related to ADLs: pt ambulated to bathroom and washed peri area at sink.  changed into new depends garment  Did not have the endurance to do whole ADL.  On 3 liters 02--sats down to 86% but rose to 90 within 2 minutes.   ADL Comments: educated on energy conservation:  handouts given.  Pt had a busy am.  Followed up in pm to continue energy conservation education.  Pt verbalizes all.      OT Diagnosis:    OT  Problem List:   OT Treatment Interventions:     OT Goals    Visit Information  Last OT Received On: 01/17/12 Assistance Needed: +1    Subjective Data  Subjective: This is all new to me Patient Stated Goal: be able to get back to the beach and travel   Prior Functioning     Home Living Bathroom Shower/Tub: Walk-in shower (with seat) Bathroom Toilet: Handicapped height Prior Function Level of Independence: Independent Communication Communication: No difficulties         Vision/Perception     Cognition  Overall Cognitive Status: Appears within functional limits for tasks assessed/performed Arousal/Alertness: Awake/alert Orientation Level: Appears intact for tasks assessed Behavior During Session: [facility] for tasks performed    Extremity/Trunk Assessment Right Upper Extremity Assessment RUE ROM/Strength/Tone: [facility] for tasks assessed Left Upper Extremity Assessment LUE ROM/Strength/Tone: WFL for tasks assessed     Mobility Bed Mobility Bed Mobility: Supine to Sit Supine to Sit: 6: Modified independent (Device/Increase time) Transfers Sit to Stand: 5: Supervision;With upper extremity assist Stand to Sit: 5: Supervision;With upper extremity assist     Shoulder Instructions     Exercise     Balance     End of Session OT - End of Session Activity Tolerance: Patient limited by fatigue Patient left: in bed;with call bell/phone within reach  GO     [facility] 01/17/2012, 4:06 PM Marica Otter,  OTR/L [phone] 01/17/2012

## 2012-01-18 ENCOUNTER — Telehealth: Payer: Self-pay | Admitting: *Deleted

## 2012-01-18 LAB — CULTURE, BLOOD (ROUTINE X 2)
Culture: NO GROWTH
Culture: NO GROWTH

## 2012-01-18 NOTE — Telephone Encounter (Signed)
Recent hospitalization for PE. Pt discharged on 60mg  Lovenox daily and 4mg  coumadin daily. INR checked today by [facility] was 2.2 and PT was 26.2. Orders from hosp state Pt is to continue w/ current regimen and f/u w/ Dr. Felicity Coyer for repeat INR on Mon 12/16.

## 2012-01-18 NOTE — Telephone Encounter (Signed)
Planned to take last dose of lovenox tomorrow This should be fine as is therapeutic today Has scheduled follow up with Dr Felicity Coyer on Monday

## 2012-01-20 ENCOUNTER — Telehealth: Payer: Self-pay

## 2012-01-20 NOTE — Telephone Encounter (Signed)
Skip coumadin today Continue LMWH as scheduled Advise additional dosing as per CC protocol

## 2012-01-20 NOTE — Telephone Encounter (Signed)
HHRN checked INR today was 4.1.  The patient took 4mg  coumadin on Sunday 01/19/12 and lovenox injection this am.  Please advise on instructions.

## 2012-01-21 ENCOUNTER — Ambulatory Visit (INDEPENDENT_AMBULATORY_CARE_PROVIDER_SITE_OTHER): Payer: Medicare Other | Admitting: General Practice

## 2012-01-21 DIAGNOSIS — I2699 Other pulmonary embolism without acute cor pulmonale: Secondary | ICD-10-CM

## 2012-01-21 DIAGNOSIS — Z7901 Long term (current) use of anticoagulants: Secondary | ICD-10-CM | POA: Insufficient documentation

## 2012-01-21 NOTE — Telephone Encounter (Signed)
Bianca [name] call back she states she spoke with pt she spoke with our coumadin clinic nurse Arline Asp) and she has given instruction on her coumadin...lmb

## 2012-01-21 NOTE — Telephone Encounter (Signed)
Received msg from stephanie  Stating she left msg on pt concerning INR. Called Stephanie back no answer LMOM md response....Raechel Chute

## 2012-01-24 ENCOUNTER — Ambulatory Visit (INDEPENDENT_AMBULATORY_CARE_PROVIDER_SITE_OTHER): Payer: Medicare Other | Admitting: General Practice

## 2012-01-24 ENCOUNTER — Telehealth: Payer: Self-pay | Admitting: *Deleted

## 2012-01-24 DIAGNOSIS — I2699 Other pulmonary embolism without acute cor pulmonale: Secondary | ICD-10-CM

## 2012-01-24 DIAGNOSIS — Z7901 Long term (current) use of anticoagulants: Secondary | ICD-10-CM

## 2012-01-24 NOTE — Telephone Encounter (Signed)
Wanted to inform md pt been loosing weight since she was d/c from hospital. She was weighing 76 pds, now she is at 73. Pt staying hydrated & stated she needed to make a hosp f/u. Called pt to set up appt no answer x's 10 rings...Raechel Chute

## 2012-01-24 NOTE — Telephone Encounter (Signed)
Called pt back made her hosp f/u for 01/30/12...lmb

## 2012-01-24 NOTE — Telephone Encounter (Signed)
Noted thanks °

## 2012-01-30 ENCOUNTER — Other Ambulatory Visit (INDEPENDENT_AMBULATORY_CARE_PROVIDER_SITE_OTHER): Payer: Medicare Other

## 2012-01-30 ENCOUNTER — Ambulatory Visit (INDEPENDENT_AMBULATORY_CARE_PROVIDER_SITE_OTHER): Payer: Medicare Other | Admitting: Internal Medicine

## 2012-01-30 ENCOUNTER — Ambulatory Visit (INDEPENDENT_AMBULATORY_CARE_PROVIDER_SITE_OTHER)
Admission: RE | Admit: 2012-01-30 | Discharge: 2012-01-30 | Disposition: A | Discharge status: Home / Self Care | Payer: Medicare Other | Source: Ambulatory Visit | Attending: Internal Medicine | Admitting: Internal Medicine

## 2012-01-30 ENCOUNTER — Encounter: Payer: Self-pay | Admitting: Internal Medicine

## 2012-01-30 VITALS — BP 110/80 | HR 103 | Temp 97.3°F | Ht 60.0 in | Wt 74.0 lb

## 2012-01-30 VITALS — BP 130/82 | HR 109 | Temp 97.7°F | Ht 60.0 in | Wt 73.1 lb

## 2012-01-30 DIAGNOSIS — J189 Pneumonia, unspecified organism: Secondary | ICD-10-CM

## 2012-01-30 DIAGNOSIS — Z7901 Long term (current) use of anticoagulants: Secondary | ICD-10-CM

## 2012-01-30 DIAGNOSIS — A0472 Enterocolitis due to Clostridium difficile, not specified as recurrent: Secondary | ICD-10-CM

## 2012-01-30 DIAGNOSIS — J9610 Chronic respiratory failure, unspecified whether with hypoxia or hypercapnia: Secondary | ICD-10-CM

## 2012-01-30 DIAGNOSIS — R0781 Pleurodynia: Secondary | ICD-10-CM

## 2012-01-30 DIAGNOSIS — E876 Hypokalemia: Secondary | ICD-10-CM

## 2012-01-30 DIAGNOSIS — J449 Chronic obstructive pulmonary disease, unspecified: Secondary | ICD-10-CM

## 2012-01-30 DIAGNOSIS — J4489 Other specified chronic obstructive pulmonary disease: Secondary | ICD-10-CM

## 2012-01-30 DIAGNOSIS — R071 Chest pain on breathing: Secondary | ICD-10-CM

## 2012-01-30 LAB — CBC WITH DIFFERENTIAL/PLATELET
Eosinophils Relative: 0.7 % (ref 0.0–5.0)
HCT: 39.7 % (ref 36.0–46.0)
Hemoglobin: 13 g/dL (ref 12.0–15.0)
Lymphs Abs: 1.5 10*3/uL (ref 0.7–4.0)
Monocytes Relative: 5.1 % (ref 3.0–12.0)
Neutro Abs: 11 10*3/uL — ABNORMAL HIGH (ref 1.4–7.7)
RDW: 14.8 % — ABNORMAL HIGH (ref 11.5–14.6)
WBC: 13.3 10*3/uL — ABNORMAL HIGH (ref 4.5–10.5)

## 2012-01-30 LAB — BASIC METABOLIC PANEL
CO2: 28 mEq/L (ref 19–32)
Calcium: 8.8 mg/dL (ref 8.4–10.5)
Creatinine, Ser: 0.4 mg/dL (ref 0.4–1.2)
Glucose, Bld: 118 mg/dL — ABNORMAL HIGH (ref 70–99)

## 2012-01-30 LAB — PROTIME-INR: INR: 1.8 ratio — ABNORMAL HIGH (ref 0.8–1.0)

## 2012-01-30 MED ORDER — TRAMADOL HCL 50 MG PO TABS: 50.0000 mg | ORAL_TABLET | Freq: Four times a day (QID) | ORAL | Status: DC | PRN

## 2012-01-30 NOTE — Assessment & Plan Note (Signed)
12/8 stool test positive during hosp - On 14d Flagyl course, diarrhea has resolved - Complete antibiotics as prescribed and call if recurrent symptoms after completion of same

## 2012-01-30 NOTE — Patient Instructions (Addendum)
Work on inhaler technique:  relax and gently blow all the way out then take a nice smooth deep breath back in, triggering the inhaler at same time you start breathing in.  Hold for up to 5 seconds if you can.  Rinse and gargle with water when done   If your mouth or throat starts to bother you,   I suggest you time the inhaler to your dental care and after using the inhaler(s) brush teeth and tongue with a baking soda containing toothpaste and when you rinse this out, gargle with it first to see if this helps your mouth and throat.    No change in respiratory medications or 02 3lpm 24/7   Please schedule a follow up office visit in 6 weeks, call sooner if needed we will address the oxygen issue when you return

## 2012-01-30 NOTE — Progress Notes (Signed)
Subjective:    Patient ID: Bianca [name], female    DOB: 13-Feb-1930   MRN: [id number]  HPI  42 yowf remote smoker quit 1983 with new onset cough  referred by Bayard Hugger 12/05/2011 to pulmonary clinic.   12/05/2011 1st pulmonary eval cc persistent cough x 8 months indolent onset mostly dry only better p prednisone for about a month after finished course, then  Some better p use albuterol vs not much change with spiriva.  Variably productive, mucoid sputum not more than a tbsp or two esp in ams.  rec Pepcid 20 mg one at bedtime Omeprazole (prilosec) 20mg   Take 30-60 min before first meal of the day  Dulera 100 Take 2 puffs first thing in am and then another 2 puffs about 12 hours later.  Only use your albuterol (ventolin)as a rescue medication to be used if you can't catch your breath Work on inhaler technique:      12/17/2011 f/u ov/Bianca [name] stopped spiriva and not needing rescue much vs before started dulera despite very poor hfa (see a/p) . rec Add back spiriva each am Continue dulera Take 2 puffs first thing in am and then another 2 puffs about 12 hours later.  Only use your albuterol (ventolin) as a rescue medication  Admit date: 01/11/2012  Discharge date: 01/17/2012  Recommendations for Outpatient Follow-up: PCP f/u in one week. Will need to get INR checked on 12/14 Saturday and 12/16 Monday  Discharge Diagnoses:  Principal Problem:  *Healthcare-associated pneumonia  Active Problems:  HYPERTENSION  COPD (chronic obstructive pulmonary disease)  Chronic respiratory failure  Elevated troponin  C. difficile colitis  PE (pulmonary embolism)  Hypokalemia  COPD exacerbation  Pleuritic chest pain  SOB (shortness of breath)  Diarrhea   01/30/2012 f/u ov/Bianca [name] cc much better on 02 24/7 but sob > 50 ft s o2 and desats noted unless on 3lpm    No obvious daytime variabilty or assoc chronic cough or cp or chest tightness, subjective wheeze overt sinus or hb symptoms. No unusual exp hx       Sleeping ok without nocturnal  or early am exacerbation  of respiratory  c/o's or need for noct saba. Also denies any obvious fluctuation of symptoms with weather or environmental changes or other aggravating or alleviating factors except as outlined above.  ROS  The following are not active complaints unless bolded sore throat, dysphagia, dental problems, itching, sneezing,  nasal congestion or excess/ purulent secretions, ear ache,   fever, chills, sweats, unintended wt loss, pleuritic or exertional cp, hemoptysis,  orthopnea pnd or leg swelling, presyncope, palpitations, heartburn, abdominal pain, anorexia, nausea, vomiting, diarrhea  or change in bowel or urinary habits, change in stools or urine, dysuria,hematuria,  rash, arthralgias, visual complaints, headache, numbness weakness or ataxia or problems with walking or coordination,  change in mood/affect or memory.           Objective:   Physical Exam  Wt 12/17/2011  76 > 74 01/30/2012  Wt Readings from Last 3 Encounters:  12/05/11 77 lb 3.2 oz (35.018 kg)  10/25/11 79 lb 8 oz (36.061 kg)  09/30/11 82 lb 1.9 oz (37.249 kg)   HEENT mild turbinate edema.  Oropharynx no thrush or excess pnd or cobblestoning.  No JVD or cervical adenopathy. Mild accessory muscle hypertrophy. Trachea midline, nl thryroid. Chest was hyperinflated by percussion with diminished breath sounds and moderate increased exp time without wheeze. Hoover sign positive at mid inspiration. Regular rate and rhythm without murmur gallop or  rub or increase P2 or edema.  Abd: no hsm, nl excursion. Ext warm without cyanosis or clubbing.   cxr 01/30/12 Stable basilar fibrosis. COPD. No active lung disease       Assessment & Plan:

## 2012-01-30 NOTE — Progress Notes (Signed)
Subjective:    Patient ID: Bianca [name], female    DOB: Mar 14, 1930, 78 y.o.   MRN: [id number]  HPI here for hospital follow up   Admit date: 01/11/2012 Discharge date: 01/17/2012  Recommendations for Outpatient Follow-up: PCP f/u in one week. Will need to get INR checked on 12/14 Saturday and 12/16 Monday  Discharge Diagnoses:   Principal Problem:  *Healthcare-associated pneumonia Active Problems:  HYPERTENSION  COPD (chronic obstructive pulmonary disease)  Chronic respiratory failure  Elevated troponin  C. difficile colitis  PE (pulmonary embolism)  Hypokalemia  COPD exacerbation  Pleuritic chest pain  SOB (shortness of breath)  Diarrhea    Past Medical History  Diagnosis Date  . DIVERTICULOSIS, COLON   . VITAMIN D DEFICIENCY   . HYPERLIPIDEMIA   . HYPERTENSION   . OSTEOPOROSIS   . COPD (chronic obstructive pulmonary disease)      Review of Systems  Constitutional: Positive for activity change and fatigue. Negative for fever, appetite change and unexpected weight change.  Respiratory: Negative for cough, choking, chest tightness, shortness of breath and wheezing.   Cardiovascular: Negative for chest pain, palpitations and leg swelling.  Gastrointestinal: Negative for abdominal pain, diarrhea, constipation and blood in stool.       Objective:   Physical Exam   BP 130/82  Pulse 109  Temp 97.7 F (36.5 C) (Oral)  Ht 5' (1.524 m)  Wt 73 lb 1.9 oz (33.167 kg)  BMI 14.28 kg/m2  SpO2 92% Wt Readings from Last 3 Encounters:  01/30/12 73 lb 1.9 oz (33.167 kg)  01/17/12 79 lb 14.4 oz (36.242 kg)  01/06/12 75 lb (34.02 kg)   General: Frail, elderly white woman. NAD Lungs: Decreased breath sounds bilateral bases, no rhonchi without wheeze -no increased work of breathing at rest Cardiovascular: Regular rate and rhythm, no edema bilateral ankles Musculoskeletal: Kyphotic, petite. No gross peripheral deformities  Lab Results  Component Value Date   WBC 7.9  01/17/2012   HGB 11.0* 01/17/2012   HCT 33.9* 01/17/2012   PLT 492* 01/17/2012   GLUCOSE 69* 01/17/2012   CHOL 239 10/12/2009   HDL 117 10/12/2009   LDLCALC 104 10/12/2009   ALT 17 01/11/2012   AST 31 01/11/2012   NA 135 01/17/2012   K 3.2* 01/17/2012   CL 98 01/17/2012   CREATININE 0.36* 01/17/2012   BUN 7 01/17/2012   CO2 33* 01/17/2012   TSH 0.776 10/12/2009   INR 2.1 01/24/2012   Dg Chest 2 View  01/11/2012  *RADIOLOGY REPORT*  Clinical Data: Cough, shortness of breath and chest congestion.  CHEST - 2 VIEW  Comparison: 01/06/2012.  Findings: Normal sized heart.  Multifocal patchy airspace opacity in both lungs with improvement in the lower lung zones and mild progression in the mid lung zones.  Minimal bilateral pleural fluid with improvement.  Stable lower thoracic vertebral compression deformities and kyphoplasty material.  Stable diffuse peribronchial thickening, accentuation of the interstitial markings and hyperexpansion of the lungs.  Old bilateral rib fractures.  IMPRESSION:  1.  Mild overall improvement in bilateral pneumonia. 2.  Stable changes of COPD and chronic bronchitis.   Original Report Authenticated By: Beckie Salts, M.D.    Nm Pulmonary Perf And Vent  01/12/2012  *RADIOLOGY REPORT*  Clinical Data:  78 year old female with chest pain, shortness of breath and elevated D-dimer.  NUCLEAR MEDICINE VENTILATION - PERFUSION LUNG SCAN  Technique:  Ventilation images were obtained in multiple projections using inhaled aerosol technetium 99 M DTPA.  Perfusion images  were obtained in multiple projections after intravenous injection of Tc-40m MAA.  Radiopharmaceuticals:  Tc-5m DTPA aerosol and 5 mCi Tc-68m MAA.  Comparison: 01/11/2012 chest radiograph  Findings:  Ventilation:  This is a suboptimal ventilation study with clumping of agent within the lungs.  Multiple scattered areas of decreased ventilation are noted, some which may be artifactual.  Perfusion:  Multiple scattered bilateral  perfusion defects are identified, primarily in the mid and upper lungs.  Many of the perfusion defects appear to match ventilation defects.  IMPRESSION: Indeterminate probability for pulmonary embolus (20-80%).   Original Report Authenticated By: Harmon Pier, M.D.      Assessment & Plan:   See problem list. Medications and labs reviewed today.  Time spent with pt today 30 minutes, greater than 50% time spent counseling patient on hosp for PNA< COPD exac, ?PE and C diff 01/2012 - also medication review and review of hosp records

## 2012-01-30 NOTE — Patient Instructions (Signed)
It was good to see you today. We have reviewed your hospital records including labs and tests today I believe it is ok to stop: lasix,potassium, prednisone AND coumadin - but talk also with Dr Sherene Sires about these medicines Test(s) ordered today - chest xray and labs. Your results will be released to MyChart (or called to you) after review, usually within 72hours after test completion. If any changes need to be made, you will be notified at that same time. Please schedule followup in 3-4 months, call sooner if problems.

## 2012-01-30 NOTE — Assessment & Plan Note (Signed)
COPD exac due to same 01/2012 reviewed Completed antibiotics and pred, on baseline O2 and pulm meds follow up with pulm today as planned - recheck CXR to ensure clearing

## 2012-01-30 NOTE — Assessment & Plan Note (Signed)
?  PNA related - or ?PE - presumptive anticoag begun with LMWH and coumadin due to mod risk VQ scan (unable to obtain CT w/ contast) Pain has resolved Pt does not wish to continue anticoag - alternate dx of advanced COPD and PNA could explain VQ scan abnormality Ok to DC coumadin if ok with pulm - appt with Dr Sherene Sires later today Recheck CXR now to ensure clearing

## 2012-01-30 NOTE — Assessment & Plan Note (Signed)
01/2012 occurrence related to diarrhea GI loss and diuretics - No need for ongoing lasix and diarrhea has resolved Check labs and plan to DC supplement replacement now

## 2012-01-30 NOTE — Assessment & Plan Note (Signed)
On home O2 since 01/2012 hospitalization Defer duration to pulm, but suspect will need to continue same

## 2012-02-01 NOTE — Assessment & Plan Note (Addendum)
Clinically quite severe and 02 dep at this point but needs pfts for adequate staging     Each maintenance medication was reviewed in detail including most importantly the difference between maintenance and as needed and under what circumstances the prns are to be used.  Please see instructions for details which were reviewed in writing and the patient given a copy.   The proper method of use, as well as anticipated side effects, of a metered-dose inhaler are discussed and demonstrated to the patient. Improved effectiveness after extensive coaching during this visit to a level of approximately  50% so either needs to improve or consider Breo next ov

## 2012-02-01 NOTE — Assessment & Plan Note (Signed)
-   sats 80% RA on arrival to clinic 12/17/2011  - 02/01/2012   Walked RA x 64f  stopped due to  desat to 84 corrected on 3lpm  For now needs to maintain on 02 3lpm 24/7 > reviewed

## 2012-02-10 ENCOUNTER — Ambulatory Visit (INDEPENDENT_AMBULATORY_CARE_PROVIDER_SITE_OTHER): Payer: Medicare Other | Admitting: General Practice

## 2012-02-10 ENCOUNTER — Ambulatory Visit (INDEPENDENT_AMBULATORY_CARE_PROVIDER_SITE_OTHER): Payer: Medicare Other | Admitting: Internal Medicine

## 2012-02-10 DIAGNOSIS — J449 Chronic obstructive pulmonary disease, unspecified: Secondary | ICD-10-CM

## 2012-02-10 DIAGNOSIS — Z7901 Long term (current) use of anticoagulants: Secondary | ICD-10-CM

## 2012-02-10 DIAGNOSIS — I2699 Other pulmonary embolism without acute cor pulmonale: Secondary | ICD-10-CM

## 2012-02-10 LAB — PULMONARY FUNCTION TEST

## 2012-02-10 NOTE — Progress Notes (Signed)
PFT done today. 

## 2012-02-11 ENCOUNTER — Encounter: Payer: Self-pay | Admitting: Internal Medicine

## 2012-02-12 ENCOUNTER — Other Ambulatory Visit: Payer: Self-pay | Admitting: Internal Medicine

## 2012-02-12 ENCOUNTER — Telehealth: Payer: Self-pay | Admitting: Internal Medicine

## 2012-02-12 NOTE — Telephone Encounter (Signed)
Per MW- PFT reviewed and shows mild to moderate COPD. Spoke with pt and notified of results per Dr. Sherene Sires. Pt verbalized understanding and denied any questions.

## 2012-02-17 DIAGNOSIS — A0472 Enterocolitis due to Clostridium difficile, not specified as recurrent: Secondary | ICD-10-CM

## 2012-02-17 DIAGNOSIS — J9610 Chronic respiratory failure, unspecified whether with hypoxia or hypercapnia: Secondary | ICD-10-CM

## 2012-02-17 DIAGNOSIS — J189 Pneumonia, unspecified organism: Secondary | ICD-10-CM

## 2012-02-17 DIAGNOSIS — Z7901 Long term (current) use of anticoagulants: Secondary | ICD-10-CM

## 2012-02-17 DIAGNOSIS — R071 Chest pain on breathing: Secondary | ICD-10-CM

## 2012-02-17 DIAGNOSIS — E876 Hypokalemia: Secondary | ICD-10-CM

## 2012-02-18 ENCOUNTER — Telehealth: Payer: Self-pay | Admitting: Internal Medicine

## 2012-02-18 NOTE — Telephone Encounter (Signed)
Called pt back made appt for tomorrow am @ 8:30...Raechel Chute

## 2012-02-18 NOTE — Telephone Encounter (Signed)
Patient Information:  Caller Name: Ivorie  Phone: [phone]  Patient: Bianca [name], Bianca [name]  Gender: Female  DOB: Feb 20, 1930  Age: 79 Years  PCP: [name] (Adults only)  Office Follow Up:  Does the office need to follow up with this patient?: Yes  Instructions For The Office: See RN note section.  RN Note:  Patient has to bring oxygen tank with her to the office for an appointment. She says that if she has to wait for a long period of time she could possibly run out of oxygen. She wants to make sure she will not have to wait long if she comes to the office. Please call her back and let her know when a good time would be to come in. Thanks.  Symptoms  Reason For Call & Symptoms: Reports she is still having severe coughing at night which keeps her awake. She is requesting medication to help with that. Was discharged from hospital 2 weeks ago. Reports she uses oxygen at home. Reports thin yellow mucus with coughing.  Reviewed Health History In EMR: Yes  Reviewed Medications In EMR: Yes  Reviewed Allergies In EMR: Yes  Reviewed Surgeries / Procedures: Yes  Date of Onset of Symptoms: 02/04/2012  Treatments Tried: Dayquil and Nyquil  Treatments Tried Worked: No  Guideline(s) Used:  Cough  Disposition Per Guideline:   See Today in Office  Reason For Disposition Reached:   Known COPD or other severe lung disease (i.e., bronchiectasis, cystic fibrosis, lung surgery) and worsening symptoms (i.e., increased sputum purulence or amount, increased breathing difficulty)  Advice Given:  N/A

## 2012-02-19 ENCOUNTER — Encounter: Payer: Self-pay | Admitting: Internal Medicine

## 2012-02-19 ENCOUNTER — Ambulatory Visit (INDEPENDENT_AMBULATORY_CARE_PROVIDER_SITE_OTHER): Payer: Medicare Other | Admitting: Internal Medicine

## 2012-02-19 VITALS — BP 130/82 | HR 82 | Temp 97.2°F | Ht 60.0 in | Wt 75.4 lb

## 2012-02-19 DIAGNOSIS — A0472 Enterocolitis due to Clostridium difficile, not specified as recurrent: Secondary | ICD-10-CM

## 2012-02-19 DIAGNOSIS — J449 Chronic obstructive pulmonary disease, unspecified: Secondary | ICD-10-CM

## 2012-02-19 MED ORDER — MOMETASONE FURO-FORMOTEROL FUM 100-5 MCG/ACT IN AERO: INHALATION_SPRAY | RESPIRATORY_TRACT | Status: DC

## 2012-02-19 MED ORDER — HYDROCODONE-HOMATROPINE 5-1.5 MG/5ML PO SYRP: 5.0000 mL | ORAL_SOLUTION | Freq: Every evening | ORAL | Status: DC | PRN

## 2012-02-19 NOTE — Patient Instructions (Addendum)
It was good to see you today. We have reviewed your interval history including labs and tests today Test(s) ordered today - stool sample. Your results will be released to MyChart (or called to you) after review, usually within 72hours after test completion. If any changes need to be made, you will be notified at that same time. Continue Dulera with spiriva as instructed - stop tessalon but use Pepcid every night - Also use Albuterol rescue inhaler before bed (and as needed during daytime) and Hydromet syrup at bedtime as needed Your prescription(s) have been submitted to your pharmacy. Please take as directed and contact our office if you believe you are having problem(s) with the medication(s). Keep follow up with Dr. Sherene Sires as planned Please schedule followup in 3-4 months, call sooner if problems.

## 2012-02-19 NOTE — Assessment & Plan Note (Signed)
01/12/12 stool test positive during hosp - s/p 14d Flagyl course, diarrhea has improved but not completely resolved - Recheck stool now

## 2012-02-19 NOTE — Progress Notes (Signed)
  Subjective:    Patient ID: Bianca [name], female    DOB: 08-30-1930, 79 y.o.   MRN: [id number]  Cough Associated symptoms include shortness of breath. Pertinent negatives include no chest pain, fever or wheezing.    complains of conitnued cough Ongoing greater than 3 months, worse at night Associated with chest congestion but little sputum Has been seen here for same and treated for acute exacerbation of COPD and bronchitis Denies improvement with prednisone Compliant with inhalers albuterol and Spiriva as prescribed - also Pepcid qhs Denies chest pain or ankle swelling  Past Medical History  Diagnosis Date  . DIVERTICULOSIS, COLON   . VITAMIN D DEFICIENCY   . HYPERLIPIDEMIA   . HYPERTENSION   . OSTEOPOROSIS   . COPD (chronic obstructive pulmonary disease)     PFTs 02/10/12: mild-mod     Review of Systems  Constitutional: Positive for activity change and fatigue. Negative for fever.  Respiratory: Positive for cough and shortness of breath. Negative for choking, chest tightness and wheezing.   Cardiovascular: Negative for chest pain, palpitations and leg swelling.       Objective:   Physical Exam   BP 130/82  Pulse 82  Temp 97.2 F (36.2 C) (Oral)  Ht 5' (1.524 m)  Wt 75 lb 6.4 oz (34.201 kg)  BMI 14.73 kg/m2  SpO2 95% Wt Readings from Last 3 Encounters:  02/19/12 75 lb 6.4 oz (34.201 kg)  01/30/12 74 lb (33.566 kg)  01/30/12 73 lb 1.9 oz (33.167 kg)   General: Frail, elderly white woman. Mild dry cough intermittent during history and exam Lungs: Decreased breath sounds bilateral bases, no rhonchi or wheeze -no increased work of breathing at rest Cardiovascular: Regular rate and rhythm, no edema bilateral ankles Abd: SNTND+BS Musculoskeletal: Kyphotic, petite. No gross peripheral deformities  Lab Results  Component Value Date   WBC 13.3* 01/30/2012   HGB 13.0 01/30/2012   HCT 39.7 01/30/2012   PLT 390.0 01/30/2012   GLUCOSE 118* 01/30/2012   CHOL 239  10/12/2009   HDL 117 10/12/2009   LDLCALC 104 10/12/2009   ALT 17 01/11/2012   AST 31 01/11/2012   NA 131* 01/30/2012   K 3.7 01/30/2012   CL 92* 01/30/2012   CREATININE 0.4 01/30/2012   BUN 14 01/30/2012   CO2 28 01/30/2012   TSH 0.776 10/12/2009   INR 1.8* 01/30/2012       Assessment & Plan:   Cough suspect related to advanced COPD, suboptimally control Loose stool, s/p tx for C diff Osteoporosis, on prolia

## 2012-02-19 NOTE — Assessment & Plan Note (Signed)
PFTs 02/2012 reviewed Severe O2 dep dz, clinically significant symptomatic disease On Spiriva, Dulera - encouraged to use Alb prn, esp qhs On O2 24/7 since 01/2012 hospitalizaton Will rx hydromet to use qhs prn and change H2B to PPI at qhs as discussed follow up with pulm

## 2012-02-20 ENCOUNTER — Other Ambulatory Visit: Payer: Medicare Other

## 2012-02-20 DIAGNOSIS — A0472 Enterocolitis due to Clostridium difficile, not specified as recurrent: Secondary | ICD-10-CM

## 2012-02-21 ENCOUNTER — Telehealth: Payer: Self-pay | Admitting: Internal Medicine

## 2012-02-21 LAB — CLOSTRIDIUM DIFFICILE EIA

## 2012-02-21 MED ORDER — METRONIDAZOLE 500 MG PO TABS: 500.0000 mg | ORAL_TABLET | Freq: Three times a day (TID) | ORAL | Status: DC

## 2012-02-21 NOTE — Telephone Encounter (Signed)
Positive C-Diff reported by [employer].

## 2012-02-21 NOTE — Telephone Encounter (Signed)
[phone] (home)  Informed pt of +C diff in setting of recurrent severe diarrhea (has improved with 1st course of flagyl 01/2012 hosp for COPD/C diff, but recurrent/worsening symptoms in past 2 weeks)  complains ofD, weakness but no fever - continued cough despite pred x 3 rounds - i advised against Zpak use because of C diff  3 weeks Flagyl - erx done - pt to call if not improved symptoms in next 1-2 weeks to consider alt antibiotics   Pt understands and agrees

## 2012-02-27 ENCOUNTER — Telehealth: Payer: Self-pay | Admitting: General Practice

## 2012-02-27 ENCOUNTER — Encounter: Payer: Self-pay | Admitting: Internal Medicine

## 2012-02-27 NOTE — Telephone Encounter (Signed)
Spoke with patient to give dosing instructions.

## 2012-03-12 ENCOUNTER — Ambulatory Visit (INDEPENDENT_AMBULATORY_CARE_PROVIDER_SITE_OTHER): Payer: Medicare Other | Admitting: Internal Medicine

## 2012-03-12 ENCOUNTER — Encounter: Payer: Self-pay | Admitting: Internal Medicine

## 2012-03-12 VITALS — BP 104/78 | HR 80 | Temp 97.2°F | Ht 61.0 in | Wt 75.0 lb

## 2012-03-12 DIAGNOSIS — J9610 Chronic respiratory failure, unspecified whether with hypoxia or hypercapnia: Secondary | ICD-10-CM

## 2012-03-12 DIAGNOSIS — J449 Chronic obstructive pulmonary disease, unspecified: Secondary | ICD-10-CM

## 2012-03-12 NOTE — Patient Instructions (Addendum)
02 is 24/7 @ 3lpm for now  Spiriva daily each am  Only use your albuterol (ventolin)as a rescue medication to be used if you can't catch your breath by resting or doing a relaxed purse lip breathing pattern. The less you use it, the better it will work when you need it.  Ok to use to up every 4 hours if needed  Please see patient coordinator before you leave today  to schedule rehab evaluation   If you are satisfied with your treatment plan let your doctor know and he/she can either refill your medications or you can return here when your prescription runs out.     If in any way you are not 100% satisfied,  please tell us.  If 100% better, tell your friends!

## 2012-03-12 NOTE — Assessment & Plan Note (Signed)
-   PFT's 02/10/12 FEV1  0.72 (50%) ratio 51 and no better p B2 DLCO 30 corrects to 48% - HFA 75% 03/12/2012  - Referred to Rehab 03/12/2012   Adequate control on present rx, reviewed not really better on dulera, mostly ecopd > rx spiriva and prn saba  The proper method of use, as well as anticipated side effects, of a metered-dose inhaler are discussed and demonstrated to the patient. Improved effectiveness after extensive coaching during this visit to a level of approximately  75%     Each maintenance medication was reviewed in detail including most importantly the difference between maintenance and as needed and under what circumstances the prns are to be used.  Please see instructions for details which were reviewed in writing and the patient given a copy.

## 2012-03-12 NOTE — Assessment & Plan Note (Signed)
-   sats 80% RA on arrival to clinic 12/17/2011  - 02/01/2012   Walked RA x 23f  stopped due to  desat to 84 corrected on 3lpm -03/12/2012  Walked 3lpm 2 laps @ 185 ft each stopped due to  88%   She walked for [place] more than usually doing at home so 3lpm is adequate but may need to be titrated up at rehab if begins to push the envelope.

## 2012-03-12 NOTE — Progress Notes (Signed)
Subjective:    Patient ID: Bianca [name], female    DOB: 02-25-1930   MRN: [id number]  HPI  79 yowf remote smoker quit 1983 with new onset cough  referred by Bayard Hugger 12/05/2011 to pulmonary clinic.   12/05/2011 1st pulmonary eval cc persistent cough x 8 months indolent onset mostly dry only better p prednisone for about a month after finished course, then  Some better p use albuterol vs not much change with spiriva.  Variably productive, mucoid sputum not more than a tbsp or two esp in ams.  rec Pepcid 20 mg one at bedtime Omeprazole (prilosec) 20mg   Take 30-60 min before first meal of the day  Dulera 100 Take 2 puffs first thing in am and then another 2 puffs about 12 hours later.  Only use your albuterol (ventolin)as a rescue medication to be used if you can't catch your breath Work on inhaler technique:      12/17/2011 f/u ov/Wert stopped spiriva and not needing rescue much vs before started dulera despite very poor hfa (see a/p) . rec Add back spiriva each am Continue dulera Take 2 puffs first thing in am and then another 2 puffs about 12 hours later.  Only use your albuterol (ventolin) as a rescue medication  Admit date: 01/11/2012  Discharge date: 01/17/2012  Recommendations for Outpatient Follow-up: PCP f/u in one week. Will need to get INR checked on 12/14 Saturday and 12/16 Monday  Discharge Diagnoses:  Principal Problem:  *Healthcare-associated pneumonia  Active Problems:  HYPERTENSION  COPD (chronic obstructive pulmonary disease)  Chronic respiratory failure  Elevated troponin  C. difficile colitis  PE (pulmonary embolism)  Hypokalemia  COPD exacerbation  Pleuritic chest pain  SOB (shortness of breath)  Diarrhea   01/30/2012 f/u ov/Wert cc much better on 02 24/7 but sob > 50 ft s o2 and desats noted unless on 3lpm rec Work on inhaler technique: .   No change in respiratory medications or 02 3lpm 24/7   03/12/2012 f/u ov/Wert cc cough is better, not using  dulera only using spiriva and staying on 3lpm getting along ok     No obvious daytime variabilty or assoc chronic cough or cp or chest tightness, subjective wheeze overt sinus or hb symptoms. No unusual exp hx     Sleeping ok without nocturnal  or early am exacerbation  of respiratory  c/o's or need for noct saba. Also denies any obvious fluctuation of symptoms with weather or environmental changes or other aggravating or alleviating factors except as outlined above.  ROS  The following are not active complaints unless bolded sore throat, dysphagia, dental problems, itching, sneezing,  nasal congestion or excess/ purulent secretions, ear ache,   fever, chills, sweats, unintended wt loss, pleuritic or exertional cp, hemoptysis,  orthopnea pnd or leg swelling, presyncope, palpitations, heartburn, abdominal pain, anorexia, nausea, vomiting, diarrhea  or change in bowel or urinary habits, change in stools or urine, dysuria,hematuria,  rash, arthralgias, visual complaints, headache, numbness weakness or ataxia or problems with walking or coordination,  change in mood/affect or memory.           Objective:   Physical Exam  Wt 12/17/2011  76 > 74 01/30/2012  > 03/12/2012 75    12/05/11 77 lb 3.2 oz (35.018 kg)  10/25/11 79 lb 8 oz (36.061 kg)  09/30/11 82 lb 1.9 oz (37.249 kg)   HEENT mild turbinate edema.  Oropharynx no thrush or excess pnd or cobblestoning.  No JVD or cervical adenopathy.  Mild accessory muscle hypertrophy. Trachea midline, nl thryroid. Chest was hyperinflated by percussion with diminished breath sounds and moderate increased exp time without wheeze. Hoover sign positive at mid inspiration. Regular rate and rhythm without murmur gallop or rub or increase P2 or edema.  Abd: no hsm, nl excursion. Ext warm without cyanosis or clubbing.   cxr 01/30/12 Stable basilar fibrosis. COPD. No active lung disease       Assessment & Plan:

## 2012-03-26 ENCOUNTER — Ambulatory Visit: Payer: Medicare Other | Admitting: Internal Medicine

## 2012-04-01 ENCOUNTER — Ambulatory Visit (INDEPENDENT_AMBULATORY_CARE_PROVIDER_SITE_OTHER): Payer: Medicare Other | Admitting: General Practice

## 2012-04-01 DIAGNOSIS — I2699 Other pulmonary embolism without acute cor pulmonale: Secondary | ICD-10-CM

## 2012-04-01 DIAGNOSIS — Z7901 Long term (current) use of anticoagulants: Secondary | ICD-10-CM

## 2012-05-12 ENCOUNTER — Encounter (HOSPITAL_COMMUNITY)
Admission: RE | Admit: 2012-05-12 | Discharge: 2012-05-12 | Disposition: A | Discharge status: Home / Self Care | Payer: Medicare Other | Source: Ambulatory Visit | Attending: Internal Medicine | Admitting: Internal Medicine

## 2012-05-12 ENCOUNTER — Encounter (HOSPITAL_COMMUNITY): Payer: Self-pay

## 2012-05-12 DIAGNOSIS — J4489 Other specified chronic obstructive pulmonary disease: Secondary | ICD-10-CM | POA: Insufficient documentation

## 2012-05-12 DIAGNOSIS — J449 Chronic obstructive pulmonary disease, unspecified: Secondary | ICD-10-CM | POA: Insufficient documentation

## 2012-05-12 DIAGNOSIS — Z5189 Encounter for other specified aftercare: Secondary | ICD-10-CM | POA: Insufficient documentation

## 2012-05-12 NOTE — Progress Notes (Signed)
Pt participated in pulmonary rehab orientation today.  Pt oriented to program guidelines and participant expectations.  Pt instructed in purse-lip breathing and demonstrated understanding.   Pt alert and oriented, well kept, appears same as her stated age, petite frame.   normal skin color.   Pt lungs clear, course, very congested cought.  Heart regular rate and rhythm.  Bowel sounds active x4.  Equal grip strength and lower extremity strength.  No pedal edema present, bluish discoloration of ankles.  VSS.  PHQ-9 score of zero.  Appropriate achievable goals self identified by patient.  Pt verbalized understanding.  Pt scheduled to begin program Tuesday April 15 @1 :30.  6 minute walk test will be given after first day of exercise.

## 2012-05-19 ENCOUNTER — Encounter (HOSPITAL_COMMUNITY)
Admission: RE | Admit: 2012-05-19 | Discharge: 2012-05-19 | Disposition: A | Discharge status: Home / Self Care | Payer: Medicare Other | Source: Ambulatory Visit | Attending: Internal Medicine | Admitting: Internal Medicine

## 2012-05-19 NOTE — Progress Notes (Signed)
First day of  Exercise in Pulmonary Rehab.  Demonstration and practice of PLB on each exercise equipment.  Able to return demonstration appropriately Tolerated exercise well, oxygen level stable above 90%.

## 2012-05-21 ENCOUNTER — Encounter (HOSPITAL_COMMUNITY)
Admission: RE | Admit: 2012-05-21 | Discharge: 2012-05-21 | Disposition: A | Discharge status: Home / Self Care | Payer: Medicare Other | Source: Ambulatory Visit | Attending: Internal Medicine | Admitting: Internal Medicine

## 2012-05-21 NOTE — Progress Notes (Signed)
Bianca [name] came in for exercise today.  We could not do walk test due to inability to obtain oxygen saturations.   Her hands are very cold.  We attempted forehead sensor and still can not get a reading during test.  We will delay walk test for now.  Heart rate 98.  Rates dyspnea at 1-2.  Continue to encourage and support.

## 2012-05-26 ENCOUNTER — Encounter (HOSPITAL_COMMUNITY)
Admission: RE | Admit: 2012-05-26 | Discharge: 2012-05-26 | Disposition: A | Discharge status: Home / Self Care | Payer: Medicare Other | Source: Ambulatory Visit | Attending: Internal Medicine | Admitting: Internal Medicine

## 2012-05-28 ENCOUNTER — Encounter (HOSPITAL_COMMUNITY)
Admission: RE | Admit: 2012-05-28 | Discharge: 2012-05-28 | Disposition: A | Discharge status: Home / Self Care | Payer: Medicare Other | Source: Ambulatory Visit | Attending: Internal Medicine | Admitting: Internal Medicine

## 2012-05-28 NOTE — Progress Notes (Signed)
Bianca [name] is participating regularly in [facility].  Discussion today regarding oxygen use.  She is not using portable oxygen because it is too heavy for her to carry, she is describing her portable unit as an e-cylinder.  I suggested we contact her provider for a better fit, she  Stated that was all they had and they had been so nice to her she did not want to change.  I have recommended that she use her oxygen when she is walking .  We are using 3L/min for exercise in Pulmonary Rehab.  We will attempt at next session to demonstrate how quickly her oxygen falls and the importance of oxygen use.  Hopefully we will be able to contact her company or change company to provide something she can be more comfortable using when she is out of the house. We will continue to support and encourage. [name] RN

## 2012-06-02 ENCOUNTER — Encounter (HOSPITAL_COMMUNITY): Payer: Medicare Other

## 2012-06-04 ENCOUNTER — Encounter (HOSPITAL_COMMUNITY): Payer: Medicare Other

## 2012-06-09 ENCOUNTER — Encounter (HOSPITAL_COMMUNITY)
Admission: RE | Admit: 2012-06-09 | Discharge: 2012-06-09 | Disposition: A | Discharge status: Home / Self Care | Payer: Medicare Other | Source: Ambulatory Visit | Attending: Internal Medicine | Admitting: Internal Medicine

## 2012-06-09 DIAGNOSIS — Z5189 Encounter for other specified aftercare: Secondary | ICD-10-CM | POA: Insufficient documentation

## 2012-06-09 DIAGNOSIS — J449 Chronic obstructive pulmonary disease, unspecified: Secondary | ICD-10-CM | POA: Insufficient documentation

## 2012-06-09 DIAGNOSIS — J4489 Other specified chronic obstructive pulmonary disease: Secondary | ICD-10-CM | POA: Insufficient documentation

## 2012-06-09 NOTE — Progress Notes (Signed)
Bianca [name] has been absent last week from Pulmonary Rehab.  States she was having allergies. Some increased cough. Reviewed signs and symptoms of COPD "flare" and advised her to follow up with MD when these symptoms occur.  [name] RN

## 2012-06-11 ENCOUNTER — Encounter (HOSPITAL_COMMUNITY): Payer: Medicare Other

## 2012-06-16 ENCOUNTER — Encounter (HOSPITAL_COMMUNITY)
Admission: RE | Admit: 2012-06-16 | Discharge: 2012-06-16 | Disposition: A | Discharge status: Home / Self Care | Payer: Medicare Other | Source: Ambulatory Visit | Attending: Internal Medicine | Admitting: Internal Medicine

## 2012-06-16 NOTE — Progress Notes (Signed)
Lance Bosch 79 y.o. female Nutrition Note Spoke with pt. Pt is underweight. Pt reports UBW 90# 01/2012. Pt wt down approximately 15# over the past 5 months "because I was so sick." Pt wants to gain wt. Pt eats 3 meals a day including a muscle milk with breakfast. High Calorie, High Protein diet discussed. Pt expressed understanding.   Nutrition Diagnosis   Increased energy expenditure related to increased energy requirements during COPD exacerbation as evidenced by BMI <20 and recent h/o wt loss. Nutrition Rx/Est. Daily Nutrition Needs for: ? wt gain 1600-2100 Kcal  55-65 gm protein   1500 mg or less sodium      Nutrition Intervention   Pt's individual nutrition plan reviewed with pt.   Handouts given for High Calorie, High Protein diet and recipes   Pt to attend the Nutrition and Lung Disease class   Continual client-centered nutrition education by RD, as part of interdisciplinary care. Goal(s) 1. The pt will recognize symptoms that can interfere with adequate oral intake, such as shortness of breath, N/V, early satiety, fatigue, ability to secure and prepare food, taste and smell changes, chewing/swallowing difficulties, and/ or pain when eating. 2. The pt will consume high-energy, high-nutrient dense beverages when necessary to compensate for decreased oral intake of solid foods. 3. Identify food quantities necessary to achieve wt gain of  -2# per week to a goal wt of 37.3-45.5 kg (82-100 lb) at graduation from pulmonary rehab. Monitor and Evaluate progress toward nutrition goal with team.   Mickle Plumb, M.Ed, RD, LDN, CDE 06/16/2012 12:25 PM

## 2012-06-16 NOTE — Progress Notes (Signed)
Follow up today with Bianca [name] regarding her evaluation of Pulmonary Rehab experience at this time.  She states she is doing more at home, feeling better altho she has had some recent illness.  She was able to go to [employer] graduation from [employer] this past week end.  Also she noted lots of other people on oxygen, most of whom were in wheelchairs.  She feels good about being able to walk around there with her oxygen and not require a wheel chair.  We will continue to encourage and support.  [name] RN

## 2012-06-18 ENCOUNTER — Encounter (HOSPITAL_COMMUNITY): Payer: Medicare Other

## 2012-06-23 ENCOUNTER — Encounter (HOSPITAL_COMMUNITY): Admission: RE | Admit: 2012-06-23 | Payer: Medicare Other | Source: Ambulatory Visit

## 2012-06-23 ENCOUNTER — Telehealth (HOSPITAL_COMMUNITY): Payer: Self-pay | Admitting: *Deleted

## 2012-06-23 NOTE — Telephone Encounter (Signed)
Telephone call placed x 2 today.  Bianca [name] has last attended Pulmonary Rehab on 5-13/14.  She does call in to say she in not feeling well, just want to make sure she has contacted her physician and offer our support.  [name] RN

## 2012-06-25 ENCOUNTER — Encounter (HOSPITAL_COMMUNITY)
Admission: RE | Admit: 2012-06-25 | Discharge: 2012-06-25 | Disposition: A | Discharge status: Home / Self Care | Payer: Medicare Other | Source: Ambulatory Visit | Attending: Internal Medicine | Admitting: Internal Medicine

## 2012-06-25 NOTE — Progress Notes (Signed)
Ms. Schorr did call out today, stated to secretary that she feels badly about missing classes and she thinks she should drop out so someone else can have her spot.  She does not want to drop but feels bad.  I have attempted to speak with her and do not get an answer, she leaves message with the secretary.  I will continue to try to talk with her one on one and encourage a visit to her MD and also to reassure her we will work with her     once she feels like coming back.   [name] RN

## 2012-06-30 ENCOUNTER — Encounter (HOSPITAL_COMMUNITY)
Admission: RE | Admit: 2012-06-30 | Discharge: 2012-06-30 | Disposition: A | Discharge status: Home / Self Care | Payer: Medicare Other | Source: Ambulatory Visit | Attending: Internal Medicine | Admitting: Internal Medicine

## 2012-07-02 ENCOUNTER — Encounter (HOSPITAL_COMMUNITY)
Admission: RE | Admit: 2012-07-02 | Discharge: 2012-07-02 | Disposition: A | Discharge status: Home / Self Care | Payer: Medicare Other | Source: Ambulatory Visit | Attending: Internal Medicine | Admitting: Internal Medicine

## 2012-07-02 ENCOUNTER — Telehealth: Payer: Self-pay | Admitting: Internal Medicine

## 2012-07-02 NOTE — Progress Notes (Signed)
Bianca [name] did attend Pulmonary Rehab session on 06/25/2012 and today 06/30/2012.  She states that she has felt very weak.  She also today states she has found she feels better when she is using her oxygen, mostly @ 2L.  She has previously been using it only intermittently with some of her activities.  She is more positive about coming to rehab, talked a great deal today about grandchildren and a newly born great grandchild.  Appears more relaxed.  She has also consulted today with nutritionist about trying to increase her weight.  She seemed positive after the consult and states she will try really hard. 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

## 2012-07-02 NOTE — Telephone Encounter (Signed)
atc pt line busy x 3 wcb

## 2012-07-02 NOTE — Progress Notes (Signed)
Bianca [name] 79 y.o. female Nutrition Note Spoke with pt. Pt continues to be underweight. Pt wt today 35.0 kg, which is up 0.6 kg. Importance of promoting wt gain discussed. Pt stated "I'm only 4'11"." Pt's low BMI discussed in detail.  This Clinical research associate stressed the importance of wt gain with pt's lung disease given pt does not have any "reserve" if she gets sick. Pt continues to eats 3 meals a day plus 3 or more snacks daily. High Calorie, High Protein diet principles reinforced. Pt expressed understanding and verbalized desire to gain wt to an initial goal wt of 80-85 lbs. Nutrition Diagnosis   Increased energy expenditure related to increased energy requirements during COPD exacerbation as evidenced by BMI <20 and recent h/o wt loss. Nutrition Rx/Est. Daily Nutrition Needs for: ? wt gain 1600-2100 Kcal  55-65 gm protein   1500 mg or less sodium      Nutrition Intervention   Pt's individual nutrition plan reviewed with pt.   High Calorie, High Protein diet reinforced   Pt to attend the Nutrition and Lung Disease class   Continual client-centered nutrition education by RD, as part of interdisciplinary care. Goal(s) 1. The pt will recognize symptoms that can interfere with adequate oral intake, such as shortness of breath, N/V, early satiety, fatigue, ability to secure and prepare food, taste and smell changes, chewing/swallowing difficulties, and/ or pain when eating. 2. The pt will consume high-energy, high-nutrient dense beverages when necessary to compensate for decreased oral intake of solid foods. 3. Identify food quantities necessary to achieve wt gain of  -2# per week to an initial goal wt of 36.4-38.6 kg (80-85 lb). Monitor and Evaluate progress toward nutrition goal with team.   Mickle Plumb, M.Ed, RD, LDN, CDE 07/02/2012 12:11 PM

## 2012-07-03 NOTE — Telephone Encounter (Signed)
I spoke with pt and she is wanting to be set up with portable concentrator. She was told [facility] does not offer this. She is wanting to know what other company does and if there are any non profit companies? Please advise PCC's thanks

## 2012-07-03 NOTE — Telephone Encounter (Signed)
Order given to [email or web address]  Tobe Sos

## 2012-07-07 ENCOUNTER — Encounter (HOSPITAL_COMMUNITY)
Admission: RE | Admit: 2012-07-07 | Discharge: 2012-07-07 | Disposition: A | Discharge status: Home / Self Care | Payer: Medicare Other | Source: Ambulatory Visit | Attending: Internal Medicine | Admitting: Internal Medicine

## 2012-07-07 DIAGNOSIS — Z5189 Encounter for other specified aftercare: Secondary | ICD-10-CM | POA: Insufficient documentation

## 2012-07-07 DIAGNOSIS — J4489 Other specified chronic obstructive pulmonary disease: Secondary | ICD-10-CM | POA: Insufficient documentation

## 2012-07-07 DIAGNOSIS — J449 Chronic obstructive pulmonary disease, unspecified: Secondary | ICD-10-CM | POA: Insufficient documentation

## 2012-07-09 ENCOUNTER — Encounter (HOSPITAL_COMMUNITY): Payer: Medicare Other

## 2012-07-14 ENCOUNTER — Encounter (HOSPITAL_COMMUNITY): Payer: Medicare Other

## 2012-07-14 ENCOUNTER — Telehealth (HOSPITAL_COMMUNITY): Payer: Self-pay | Admitting: Internal Medicine

## 2012-07-14 ENCOUNTER — Telehealth: Payer: Self-pay | Admitting: Internal Medicine

## 2012-07-14 DIAGNOSIS — J449 Chronic obstructive pulmonary disease, unspecified: Secondary | ICD-10-CM

## 2012-07-14 NOTE — Telephone Encounter (Signed)
Yes, fine with me to use a portable concentrator if pt qualifies for this vs other options for portable 02, which she certainly needs

## 2012-07-14 NOTE — Telephone Encounter (Signed)
Patient requesting a fax sent to [facility] [phone] Attn: Intake--- For a portable o2 concentrator Dr. Sherene Sires can this order be placed? Thank you

## 2012-07-15 NOTE — Telephone Encounter (Signed)
Pt aware. Nothing further was needed staff message sent to [facility] as well.

## 2012-07-16 ENCOUNTER — Encounter (HOSPITAL_COMMUNITY)
Admission: RE | Admit: 2012-07-16 | Discharge: 2012-07-16 | Disposition: A | Discharge status: Home / Self Care | Payer: Medicare Other | Source: Ambulatory Visit | Attending: Internal Medicine | Admitting: Internal Medicine

## 2012-07-21 ENCOUNTER — Telehealth (HOSPITAL_COMMUNITY): Payer: Self-pay | Admitting: Internal Medicine

## 2012-07-21 ENCOUNTER — Encounter (HOSPITAL_COMMUNITY): Payer: Medicare Other

## 2012-07-23 ENCOUNTER — Encounter (HOSPITAL_COMMUNITY): Payer: Medicare Other

## 2012-07-28 ENCOUNTER — Encounter (HOSPITAL_COMMUNITY)
Admission: RE | Admit: 2012-07-28 | Discharge: 2012-07-28 | Disposition: A | Discharge status: Home / Self Care | Payer: Medicare Other | Source: Ambulatory Visit | Attending: Internal Medicine | Admitting: Internal Medicine

## 2012-07-28 NOTE — Progress Notes (Signed)
Bianca [name] participated in Pulmonary rehab today.  She was absent last week due to back pain.  She stated this is much improved, felt she might be trying to hard with the exercise.  She is feeling much better today, denies back pain.  Work loads decreased and activity paced.  She states she feels so much better because of the exercise, more energy and doing more at home when she is free of the back pain.  She is looking forward to vacation time in August with her family. A very positive outlook,  proactive in taking care of her health  Continues to be active in her social groups, very social while here in rehab. We will continue to encourage and support.  [name] RN.

## 2012-07-30 ENCOUNTER — Encounter (HOSPITAL_COMMUNITY)
Admission: RE | Admit: 2012-07-30 | Discharge: 2012-07-30 | Disposition: A | Discharge status: Home / Self Care | Payer: Medicare Other | Source: Ambulatory Visit | Attending: Internal Medicine | Admitting: Internal Medicine

## 2012-08-04 ENCOUNTER — Encounter (HOSPITAL_COMMUNITY)
Admission: RE | Admit: 2012-08-04 | Discharge: 2012-08-04 | Disposition: A | Discharge status: Home / Self Care | Payer: Medicare Other | Source: Ambulatory Visit | Attending: Internal Medicine | Admitting: Internal Medicine

## 2012-08-04 DIAGNOSIS — J449 Chronic obstructive pulmonary disease, unspecified: Secondary | ICD-10-CM | POA: Insufficient documentation

## 2012-08-04 DIAGNOSIS — Z5189 Encounter for other specified aftercare: Secondary | ICD-10-CM | POA: Insufficient documentation

## 2012-08-04 DIAGNOSIS — J4489 Other specified chronic obstructive pulmonary disease: Secondary | ICD-10-CM | POA: Insufficient documentation

## 2012-08-06 ENCOUNTER — Encounter (HOSPITAL_COMMUNITY): Payer: Medicare Other

## 2012-08-11 ENCOUNTER — Encounter (HOSPITAL_COMMUNITY)
Admission: RE | Admit: 2012-08-11 | Discharge: 2012-08-11 | Disposition: A | Discharge status: Home / Self Care | Payer: Medicare Other | Source: Ambulatory Visit | Attending: Internal Medicine | Admitting: Internal Medicine

## 2012-08-13 ENCOUNTER — Encounter (HOSPITAL_COMMUNITY): Payer: Medicare Other

## 2012-08-18 ENCOUNTER — Encounter (HOSPITAL_COMMUNITY): Payer: Medicare Other

## 2012-08-18 ENCOUNTER — Telehealth (HOSPITAL_COMMUNITY): Payer: Self-pay | Admitting: Internal Medicine

## 2012-08-20 ENCOUNTER — Encounter (HOSPITAL_COMMUNITY): Payer: Medicare Other

## 2012-08-25 ENCOUNTER — Encounter (HOSPITAL_COMMUNITY): Payer: Medicare Other

## 2012-08-27 ENCOUNTER — Encounter (HOSPITAL_COMMUNITY)
Admission: RE | Admit: 2012-08-27 | Discharge: 2012-08-27 | Disposition: A | Discharge status: Home / Self Care | Payer: Medicare Other | Source: Ambulatory Visit | Attending: Internal Medicine | Admitting: Internal Medicine

## 2012-08-27 NOTE — Progress Notes (Signed)
Nutrition Note Spoke with pt. Pt wt today 36.6 kg, which is up a desired 1.6 kg (3.5 lb) over the past 2 months. Pt states she feels better and her clothing is fitting better. Pt denies snacking and states she eats her 3 meals. The need to follow a High Calorie, High Protein diet reinforced again. Pt expressed understanding of the information reviewed. Continue client-centered nutrition education by RD as part of interdisciplinary care.  Monitor and evaluate progress toward nutrition goal with team.  Mickle Plumb, M.Ed, RD, LDN, CDE 08/27/2012 1:52 PM

## 2012-09-01 ENCOUNTER — Encounter (HOSPITAL_COMMUNITY): Payer: Medicare Other

## 2012-09-01 ENCOUNTER — Telehealth (HOSPITAL_COMMUNITY): Payer: Self-pay | Admitting: Internal Medicine

## 2012-09-17 ENCOUNTER — Telehealth (HOSPITAL_COMMUNITY): Payer: Self-pay | Admitting: *Deleted

## 2012-09-17 NOTE — Telephone Encounter (Signed)
Bianca [name] called Pulmonary Rehab Department to request discharge due to continual back problems after her exercise sessions. She completed 15 sessions.   [name] RN

## 2013-01-03 ENCOUNTER — Inpatient Hospital Stay (HOSPITAL_COMMUNITY): Payer: Medicare Other

## 2013-01-03 ENCOUNTER — Encounter (HOSPITAL_COMMUNITY): Payer: Self-pay | Admitting: Emergency Medicine

## 2013-01-03 ENCOUNTER — Emergency Department (INDEPENDENT_AMBULATORY_CARE_PROVIDER_SITE_OTHER)
Admission: EM | Admit: 2013-01-03 | Discharge: 2013-01-03 | Disposition: A | Discharge status: Other Acute Inpatient Hospital | Payer: Medicare Other | Source: Home / Self Care | Attending: Family Medicine | Admitting: Family Medicine

## 2013-01-03 ENCOUNTER — Other Ambulatory Visit: Payer: Self-pay

## 2013-01-03 ENCOUNTER — Inpatient Hospital Stay (HOSPITAL_COMMUNITY)
Admission: EM | Admit: 2013-01-03 | Discharge: 2013-01-08 | DRG: 193 | Disposition: A | Discharge status: Home / Self Care | Payer: Medicare Other | Attending: Internal Medicine | Admitting: Internal Medicine

## 2013-01-03 ENCOUNTER — Emergency Department (HOSPITAL_COMMUNITY): Payer: Medicare Other

## 2013-01-03 ENCOUNTER — Emergency Department (INDEPENDENT_AMBULATORY_CARE_PROVIDER_SITE_OTHER): Payer: PRIVATE HEALTH INSURANCE

## 2013-01-03 DIAGNOSIS — Z87891 Personal history of nicotine dependence: Secondary | ICD-10-CM

## 2013-01-03 DIAGNOSIS — E43 Unspecified severe protein-calorie malnutrition: Secondary | ICD-10-CM | POA: Diagnosis present

## 2013-01-03 DIAGNOSIS — J441 Chronic obstructive pulmonary disease with (acute) exacerbation: Secondary | ICD-10-CM | POA: Diagnosis present

## 2013-01-03 DIAGNOSIS — E785 Hyperlipidemia, unspecified: Secondary | ICD-10-CM | POA: Diagnosis present

## 2013-01-03 DIAGNOSIS — J9610 Chronic respiratory failure, unspecified whether with hypoxia or hypercapnia: Secondary | ICD-10-CM

## 2013-01-03 DIAGNOSIS — Z9981 Dependence on supplemental oxygen: Secondary | ICD-10-CM

## 2013-01-03 DIAGNOSIS — D649 Anemia, unspecified: Secondary | ICD-10-CM | POA: Diagnosis present

## 2013-01-03 DIAGNOSIS — M810 Age-related osteoporosis without current pathological fracture: Secondary | ICD-10-CM | POA: Diagnosis present

## 2013-01-03 DIAGNOSIS — R531 Weakness: Secondary | ICD-10-CM | POA: Diagnosis present

## 2013-01-03 DIAGNOSIS — Z681 Body mass index (BMI) 19 or less, adult: Secondary | ICD-10-CM

## 2013-01-03 DIAGNOSIS — J189 Pneumonia, unspecified organism: Principal | ICD-10-CM | POA: Diagnosis present

## 2013-01-03 DIAGNOSIS — R197 Diarrhea, unspecified: Secondary | ICD-10-CM | POA: Diagnosis present

## 2013-01-03 DIAGNOSIS — J449 Chronic obstructive pulmonary disease, unspecified: Secondary | ICD-10-CM

## 2013-01-03 DIAGNOSIS — E860 Dehydration: Secondary | ICD-10-CM | POA: Diagnosis present

## 2013-01-03 DIAGNOSIS — I10 Essential (primary) hypertension: Secondary | ICD-10-CM | POA: Diagnosis present

## 2013-01-03 DIAGNOSIS — E876 Hypokalemia: Secondary | ICD-10-CM | POA: Diagnosis not present

## 2013-01-03 DIAGNOSIS — M542 Cervicalgia: Secondary | ICD-10-CM | POA: Diagnosis present

## 2013-01-03 LAB — BASIC METABOLIC PANEL
BUN: 16 mg/dL (ref 6–23)
Calcium: 9 mg/dL (ref 8.4–10.5)
GFR calc non Af Amer: >90 mL/min (ref >90)
Glucose, Bld: 107 mg/dL — ABNORMAL HIGH (ref 70–99)

## 2013-01-03 LAB — PROTIME-INR: Prothrombin Time: 13.5 seconds (ref 11.6–15.2)

## 2013-01-03 LAB — POCT I-STAT TROPONIN I

## 2013-01-03 LAB — CBC WITH DIFFERENTIAL/PLATELET
Basophils Relative: 0 % (ref 0–1)
Eosinophils Absolute: 0.2 10*3/uL (ref 0.0–0.7)
Hemoglobin: 11.4 g/dL — ABNORMAL LOW (ref 12.0–15.0)
MCH: 31.9 pg (ref 26.0–34.0)
MCHC: 33.3 g/dL (ref 30.0–36.0)
Monocytes Relative: 18 % — ABNORMAL HIGH (ref 3–12)
Neutrophils Relative %: 71 % (ref 43–77)

## 2013-01-03 MED ORDER — ALBUTEROL SULFATE (5 MG/ML) 0.5% IN NEBU
2.5000 mg | INHALATION_SOLUTION | RESPIRATORY_TRACT | Status: DC | PRN
  Administered 2013-01-05: 2.5 mg via RESPIRATORY_TRACT
  Filled 2013-01-03: qty 0.5

## 2013-01-03 MED ORDER — LEVOFLOXACIN IN D5W 500 MG/100ML IV SOLN
500.0000 mg | Freq: Once | INTRAVENOUS | Status: AC
  Administered 2013-01-03: 500 mg via INTRAVENOUS
  Filled 2013-01-03: qty 100

## 2013-01-03 MED ORDER — SODIUM CHLORIDE 0.9 % IJ SOLN
3.0000 mL | Freq: Two times a day (BID) | INTRAMUSCULAR | Status: DC
  Administered 2013-01-03 – 2013-01-08 (×6): 3 mL via INTRAVENOUS

## 2013-01-03 MED ORDER — PHENOL 1.4 % MT LIQD
1.0000 | OROMUCOSAL | Status: DC | PRN
  Administered 2013-01-03: 1 via OROMUCOSAL
  Filled 2013-01-03: qty 177

## 2013-01-03 MED ORDER — ALBUTEROL SULFATE (5 MG/ML) 0.5% IN NEBU
2.5000 mg | INHALATION_SOLUTION | Freq: Four times a day (QID) | RESPIRATORY_TRACT | Status: DC
  Administered 2013-01-04 (×4): 2.5 mg via RESPIRATORY_TRACT
  Filled 2013-01-03 (×4): qty 0.5

## 2013-01-03 MED ORDER — POTASSIUM CHLORIDE CRYS ER 20 MEQ PO TBCR
40.0000 meq | EXTENDED_RELEASE_TABLET | Freq: Once | ORAL | Status: DC
  Filled 2013-01-03: qty 2

## 2013-01-03 MED ORDER — ACETAMINOPHEN 650 MG RE SUPP: 650.0000 mg | Freq: Four times a day (QID) | RECTAL | Status: DC | PRN

## 2013-01-03 MED ORDER — LOSARTAN POTASSIUM 25 MG PO TABS
25.0000 mg | ORAL_TABLET | Freq: Every day | ORAL | Status: DC
  Administered 2013-01-04 – 2013-01-06 (×3): 25 mg via ORAL
  Filled 2013-01-03 (×5): qty 1

## 2013-01-03 MED ORDER — PNEUMOCOCCAL VAC POLYVALENT 25 MCG/0.5ML IJ INJ
0.5000 mL | INJECTION | INTRAMUSCULAR | Status: DC
  Filled 2013-01-03: qty 0.5

## 2013-01-03 MED ORDER — LEVOFLOXACIN IN D5W 500 MG/100ML IV SOLN
500.0000 mg | INTRAVENOUS | Status: DC
  Filled 2013-01-03: qty 100

## 2013-01-03 MED ORDER — CHOLECALCIFEROL 10 MCG (400 UNIT) PO TABS
1200.0000 [IU] | ORAL_TABLET | Freq: Every day | ORAL | Status: DC
  Administered 2013-01-04 – 2013-01-08 (×5): 1200 [IU] via ORAL
  Filled 2013-01-03 (×5): qty 3

## 2013-01-03 MED ORDER — ACETAMINOPHEN 325 MG PO TABS: 650.0000 mg | ORAL_TABLET | Freq: Four times a day (QID) | ORAL | Status: DC | PRN

## 2013-01-03 MED ORDER — BUDESONIDE 0.25 MG/2ML IN SUSP
0.2500 mg | Freq: Two times a day (BID) | RESPIRATORY_TRACT | Status: DC
  Administered 2013-01-04 – 2013-01-08 (×9): 0.25 mg via RESPIRATORY_TRACT
  Filled 2013-01-03 (×12): qty 2

## 2013-01-03 MED ORDER — IPRATROPIUM BROMIDE 0.02 % IN SOLN
0.5000 mg | Freq: Four times a day (QID) | RESPIRATORY_TRACT | Status: DC
  Administered 2013-01-04 (×4): 0.5 mg via RESPIRATORY_TRACT
  Filled 2013-01-03 (×4): qty 2.5

## 2013-01-03 MED ORDER — ONDANSETRON HCL 4 MG PO TABS: 4.0000 mg | ORAL_TABLET | Freq: Four times a day (QID) | ORAL | Status: DC | PRN

## 2013-01-03 MED ORDER — SODIUM CHLORIDE 0.9 % IJ SOLN
3.0000 mL | Freq: Two times a day (BID) | INTRAMUSCULAR | Status: DC
Start: 2013-01-03 — End: 2013-01-05
  Administered 2013-01-04: 3 mL via INTRAVENOUS

## 2013-01-03 MED ORDER — ONDANSETRON HCL 4 MG/2ML IJ SOLN: 4.0000 mg | Freq: Four times a day (QID) | INTRAMUSCULAR | Status: DC | PRN

## 2013-01-03 MED ORDER — ENOXAPARIN SODIUM 40 MG/0.4ML ~~LOC~~ SOLN
40.0000 mg | Freq: Every day | SUBCUTANEOUS | Status: DC
  Administered 2013-01-03 – 2013-01-04 (×2): 40 mg via SUBCUTANEOUS
  Filled 2013-01-03 (×2): qty 0.4

## 2013-01-03 MED ORDER — GUAIFENESIN 100 MG/5ML PO SYRP
200.0000 mg | ORAL_SOLUTION | ORAL | Status: DC | PRN
  Administered 2013-01-03: 200 mg via ORAL
  Filled 2013-01-03: qty 10

## 2013-01-03 MED ORDER — TRAMADOL HCL 50 MG PO TABS
50.0000 mg | ORAL_TABLET | Freq: Four times a day (QID) | ORAL | Status: DC | PRN
  Administered 2013-01-05 – 2013-01-06 (×2): 50 mg via ORAL
  Filled 2013-01-03 (×2): qty 1

## 2013-01-03 MED ORDER — ALBUTEROL SULFATE (5 MG/ML) 0.5% IN NEBU
5.0000 mg | INHALATION_SOLUTION | Freq: Once | RESPIRATORY_TRACT | Status: AC
  Administered 2013-01-03: 5 mg via RESPIRATORY_TRACT
  Filled 2013-01-03: qty 1

## 2013-01-03 MED ORDER — VITAMIN D 400 UNITS PO TABS: 1200.0000 [IU] | ORAL_TABLET | Freq: Every day | ORAL | Status: DC

## 2013-01-03 MED ORDER — MORPHINE SULFATE 2 MG/ML IJ SOLN
2.0000 mg | Freq: Once | INTRAMUSCULAR | Status: AC
  Administered 2013-01-03: 2 mg via INTRAVENOUS
  Filled 2013-01-03: qty 1

## 2013-01-03 MED ORDER — IOHEXOL 350 MG/ML SOLN
80.0000 mL | Freq: Once | INTRAVENOUS | Status: AC | PRN
  Administered 2013-01-03: 60 mL via INTRAVENOUS

## 2013-01-03 NOTE — Progress Notes (Signed)
Admitting Diagnosis:sob  Pertinent BM:WUXL  Living Situation:home with husband  Diet Tolerance:cardiac  Foley:bsc  KGM:[id number]  Mobility:one assist  Tele:ST box 8  Skin:wnl  Abnormal Labs:  Issues that need to be addressed:  Consults, Needs or Recommendations:  Pending Procedures:  Bowel Movement:  IV/Drips/Abx:raf 11/30 nsl  Patient Disposition:home

## 2013-01-03 NOTE — Progress Notes (Signed)
Bianca [name] [id number] Admitted [id number]: 01/03/2013 11:23 PM Attending Provider: Eduard Clos, MD    Bianca [name] is a 79 y.o. female patient admitted from ED awake, alert  & orientated  X 3,  Full Code, VSS - Blood pressure 120/75, pulse 106, temperature 99 F (37.2 C), temperature source Oral, resp. rate 24, height 4\' 11"  (1.499 m), weight 37.195 kg (82 lb), SpO2 90.00%., O2    4  L nasal cannular, no c/o shortness of breath, no c/o chest pain, no distress noted. Tele # 08 placed and pt is currently running:sinus tachycardia.   IV site WDL:  forearm right, condition patent and no redness with a transparent dsg that's clean dry and intact.  Allergies:   Allergies  Allergen Reactions  . Aspirin Nausea And Vomiting  . Codeine Hives  . Penicillins Swelling  . Shrimp [Shellfish Allergy] Swelling  . Tomato     Swelling, hives     Past Medical History  Diagnosis Date  . DIVERTICULOSIS, COLON   . VITAMIN D DEFICIENCY   . HYPERLIPIDEMIA   . HYPERTENSION   . OSTEOPOROSIS   . COPD (chronic obstructive pulmonary disease)     PFTs 02/10/12: mild-mod    History:  obtained from the patient.  Pt orientation to unit, room and routine. Information packet given to patient/family and safety video watched.  Admission INP armband ID verified with patient/family, and in place. SR up x 2, fall risk assessment complete with Patient and family verbalizing understanding of risks associated with falls. Pt verbalizes an understanding of how to use the call bell and to call for help before getting out of bed.  Skin, clean-dry- intact without evidence of bruising, or skin tears.   No evidence of skin break down noted on exam.    Will cont to monitor and assist as needed.  Tessie Eke, RN 01/03/2013 11:23 PM

## 2013-01-03 NOTE — ED Provider Notes (Signed)
CSN: [id number]     Arrival date & time 01/03/13  1543 History   First MD Initiated Contact with Patient 01/03/13 1604     Chief Complaint  Patient presents with  . Shortness of Breath   (Consider location/radiation/quality/duration/timing/severity/associated sxs/prior Treatment) Patient is a 79 y.o. female presenting with shortness of breath. The history is provided by the patient.  Shortness of Breath Associated symptoms: cough and neck pain   Associated symptoms: no abdominal pain, no chest pain, no headaches, no rash and no vomiting    patient presents with shortness of breath for the last 5 days. States began rather suddenly while riding in the car. She's had cough productive of sputum. She has had to go up on her chronic oxygen from 2 L to 3 L. The pain is in her left lower chest. States she also has pain up in her left neck. No fevers. She was seen in urgent care had chest x-ray that showed possible left lower lobe pneumonia. She was sent in for possible admission. She has chronic severe COPD and is on oxygen. She denies heart problems. She has a previous history of pulmonary embolisms and states she is on Coumadin still.  Past Medical History  Diagnosis Date  . DIVERTICULOSIS, COLON   . VITAMIN D DEFICIENCY   . HYPERLIPIDEMIA   . HYPERTENSION   . OSTEOPOROSIS   . COPD (chronic obstructive pulmonary disease)     PFTs 02/10/12: mild-mod   Past Surgical History  Procedure Laterality Date  . Cystoscopy  07/2005  . Vertebroplasty  03/2005    Dr. Deanne Coffer   Family History  Problem Relation Age of Onset  . Heart disease Mother   . Hypertension Mother   . Heart disease Father   . Hypertension Father    History  Substance Use Topics  . Smoking status: Former Smoker -- 0.30 packs/day for 31 years    Types: Cigarettes    Start date: 02/04/1950    Quit date: 02/04/1981  . Smokeless tobacco: Never Used     Comment: Married, lives with spouse. retired  . Alcohol Use: 1.2 oz/week     2 Glasses of wine per week   OB History   Grav Para Term Preterm Abortions TAB SAB Ect Mult Living                 Review of Systems  Constitutional: Negative for activity change and appetite change.  Eyes: Negative for pain.  Respiratory: Positive for cough and shortness of breath. Negative for chest tightness.   Cardiovascular: Negative for chest pain and leg swelling.  Gastrointestinal: Negative for nausea, vomiting, abdominal pain and diarrhea.  Genitourinary: Negative for flank pain.  Musculoskeletal: Positive for neck pain. Negative for back pain and neck stiffness.  Skin: Negative for rash.  Neurological: Negative for weakness, numbness and headaches.  Psychiatric/Behavioral: Negative for behavioral problems.    Allergies  Aspirin; Codeine; Penicillins; Shrimp; and Tomato  Home Medications   Current Outpatient Rx  Name  Route  Sig  Dispense  Refill  . acetaminophen (TYLENOL) 500 MG tablet   Oral   Take 1,000 mg by mouth every 6 (six) hours as needed for mild pain.         Marland Kitchen albuterol (PROVENTIL HFA;VENTOLIN HFA) 108 (90 BASE) MCG/ACT inhaler   Inhalation   Inhale 2 puffs into the lungs every 4 (four) hours as needed.         Marland Kitchen denosumab (PROLIA) 60 MG/ML SOLN injection  Subcutaneous   Inject 60 mg into the skin every 6 (six) months. Administer in upper arm, thigh, or abdomen. Dr Hoy Register office   1 mL      . losartan (COZAAR) 50 MG tablet   Oral   Take 25 mg by mouth daily.         . NON FORMULARY      Adult Gummy multivitamin take 2 daily          . tiotropium (SPIRIVA) 18 MCG inhalation capsule   Inhalation   Place 18 mcg into inhaler and inhale daily.         . traMADol (ULTRAM) 50 MG tablet   Oral   Take 50 mg by mouth every 6 (six) hours as needed for moderate pain.         . vitamin D, CHOLECALCIFEROL, 400 UNITS tablet   Oral   Take 1,200 Units by mouth daily.          BP 116/55  Pulse 109  Temp(Src) 99.4 F (37.4 C)  (Oral)  Resp 22  Ht 4\' 11"  (1.499 m)  Wt 82 lb (37.195 kg)  BMI 16.55 kg/m2  SpO2 90% Physical Exam  Nursing note and vitals reviewed. Constitutional: She is oriented to person, place, and time. She appears well-developed and well-nourished.  HENT:  Head: Normocephalic and atraumatic.  Eyes: EOM are normal. Pupils are equal, round, and reactive to light.  Neck: Normal range of motion. Neck supple.  Cardiovascular: Regular rhythm and normal heart sounds.   No murmur heard. Mild tachycardia  Pulmonary/Chest: No respiratory distress. She has wheezes. She has no rales.  Mild diffuse expiratory wheezes. Harsh breath sounds left base.  Abdominal: Soft. Bowel sounds are normal. She exhibits no distension. There is no tenderness. There is no rebound and no guarding.  Musculoskeletal: Normal range of motion.  Neurological: She is alert and oriented to person, place, and time. No cranial nerve deficit.  Skin: Skin is warm and dry.  Psychiatric: She has a normal mood and affect. Her speech is normal.    ED Course  Procedures (including critical care time) Labs Review Labs Reviewed  BASIC METABOLIC PANEL - Abnormal; Notable for the following:    Potassium 3.0 (*)    Chloride 93 (*)    Glucose, Bld 107 (*)    Creatinine, Ser 0.48 (*)    All other components within normal limits  CBC WITH DIFFERENTIAL - Abnormal; Notable for the following:    WBC 12.2 (*)    RBC 3.57 (*)    Hemoglobin 11.4 (*)    HCT 34.2 (*)    Neutro Abs 8.6 (*)    Lymphocytes Relative 10 (*)    Monocytes Relative 18 (*)    Monocytes Absolute 2.2 (*)    All other components within normal limits  D-DIMER, QUANTITATIVE - Abnormal; Notable for the following:    D-Dimer, Quant 3.45 (*)    All other components within normal limits  PROTIME-INR  POCT I-STAT TROPONIN I   Imaging Review Dg Chest 2 View  01/03/2013   CLINICAL DATA:  Cough and shortness of breath.  EXAM: CHEST  2 VIEW  COMPARISON:  01/30/2012.   FINDINGS: Mediastinum and hilar structures are normal. Heart size normal. Pulmonary vascularity normal. Diffuse pulmonary interstitial changes noted consistent with interstitial fibrosis again noted. Bullous COPD. No significant pleural effusion or pneumothorax. Minimal infiltrate left lung base cannot be excluded. No acute bony abnormality. Old bilateral rib fractures are noted. Prior  thoracic spine kyphoplasty. Diffuse thoracic spine osteopenia and degenerative change.  IMPRESSION: 1. Minimal infiltrate left lung base cannot be excluded. 2. Severe pulmonary interstitial fibrosis in COPD.   Electronically Signed   By: Maisie Fus  Register   On: 01/03/2013 14:17   Ct Angio Chest W/cm &/or Wo Cm  01/03/2013   CLINICAL DATA:  Shortness of breath. COPD. Home oxygen. Left arm pain. Back pain with cough. Elevated D-dimer.  EXAM: CT ANGIOGRAPHY CHEST WITH CONTRAST  TECHNIQUE: Multidetector CT imaging of the chest was performed using the standard protocol during bolus administration of intravenous contrast. Multiplanar CT image reconstructions including MIPs were obtained to evaluate the vascular anatomy.  CONTRAST:  60mL OMNIPAQUE IOHEXOL 350 MG/ML SOLN  COMPARISON:  01/03/2013  FINDINGS: No filling defect is identified in the pulmonary arterial tree to suggest pulmonary embolus. Atherosclerotic calcification of the aortic arch noted. No aortic dissection observed.  Right hilar node short axis 0.8 cm. Right infrahilar node short axis 1.0 cm. Left infrahilar node short axis is 0.7 cm.  Coronary artery atherosclerotic calcification.  Markedly severe centrilobular emphysema. Dependent airspace opacities in both lower lobes have some associated volume loss but also some airspace filling process potentially reflecting aspiration pneumonitis or lower lobe pneumonia. There is mild atelectasis in the lingula and some interstitial and patchy ground-glass opacity anteriorly in the right upper lobe.  Compression fractures observed  at T10 and T12 with prior vertebral augmentations. Left rib deformities due to old fractures.  Review of the MIP images confirms the above findings.  IMPRESSION: 1. No acute pulmonary embolus or thoracic aortic dissection. 2. Bibasilar airspace opacities mixed in with volume loss in both lower lobes. Cannot exclude aspiration pneumonitis or lower lobe pneumonia. 3. Localized inflammatory appearing interstitial accentuation and patchy ground-glass opacity anteriorly in the right upper lobe. 4. Coronary artery atherosclerosis. 5. Right hilar and bilateral infrahilar lymph nodes are likely reactive. 6. Markedly severe emphysema.   Electronically Signed   By: Herbie Baltimore M.D.   On: 01/03/2013 18:58    EKG Interpretation    Date/Time:  Sunday January 03 2013 16:40:02 EST Ventricular Rate:  102 PR Interval:  195 QRS Duration: 87 QT Interval:  349 QTC Calculation: 455 R Axis:   23 Text Interpretation:  Sinus tachycardia Consider right atrial enlargement Confirmed by Danuta Huseman  MD, Keziah Avis ([id number]) on 01/03/2013 4:57:07 PM            MDM   1. CAP (community acquired pneumonia)    Patient with shortness of breath with some production. Has baseline severe COPD. X-ray showed pneumonia. The symptoms were acute onset and d-dimer is elevated so CT was done. It does not show pulmonary embolism. Will be admitted to medicine.    Juliet Rude. Rubin Payor, MD 01/03/13 2036

## 2013-01-03 NOTE — ED Notes (Signed)
Pt sent here from UC for sob and r/o pnx.  Pt with hx of copd and uses 2L o2 every day.  Tuesday pt experienced L arm pain that made her vomit.  Presently feels pain to back when she coughs.

## 2013-01-03 NOTE — H&P (Signed)
[facility] History and Physical  Bianca [name] JXB:[id number] DOB: [birth year] DOA: 01/03/2013  Referring physician: ER physician. PCP: [name], MD  Specialists: Dr. Sherene Sires. Pulmonologist.  Chief Complaint: Shortness of breath and neck pain.  HPI: Bianca [name] is a 79 y.o. female with known history of COPD presented to the ER because of shortness of breath and neck pain. Patient has been having these symptoms over the last 4 days which has been gradually worsening. Patient also has been having some productive cough. Denies any fever chills chest pain nausea vomiting or diarrhea. Denies any fall or trauma. In the ER patient had CT angiogram of the chest which was negative for PE but did show possible pneumonia. Patient was placed on nebulizer and antibiotics and has been admitted for further management.   Review of Systems: As presented in the history of presenting illness, rest negative.  Past Medical History  Diagnosis Date  . DIVERTICULOSIS, COLON   . VITAMIN D DEFICIENCY   . HYPERLIPIDEMIA   . HYPERTENSION   . OSTEOPOROSIS   . COPD (chronic obstructive pulmonary disease)     PFTs 02/10/12: mild-mod   Past Surgical History  Procedure Laterality Date  . Cystoscopy  07/2005  . Vertebroplasty  03/2005    Dr. Deanne Coffer   Social History:  reports that she quit smoking about 31 years ago. Her smoking use included Cigarettes. She started smoking about 62 years ago. She has a 9.3 pack-year smoking history. She has never used smokeless tobacco. She reports that she drinks about 1.2 ounces of alcohol per week. She reports that she does not use illicit drugs. Where does patient live home. Can patient participate in ADLs? Yes.  Allergies  Allergen Reactions  . Aspirin Nausea And Vomiting  . Codeine Hives  . Penicillins Swelling  . Shrimp [Shellfish Allergy] Swelling  . Tomato     Swelling, hives    Family History:  Family History  Problem Relation Age of Onset  .  Heart disease Mother   . Hypertension Mother   . Heart disease Father   . Hypertension Father       Prior to Admission medications   Medication Sig Start Date End Date Taking? Authorizing Provider  acetaminophen (TYLENOL) 500 MG tablet Take 1,000 mg by mouth every 6 (six) hours as needed for mild pain.   Yes Historical Provider, MD  albuterol (PROVENTIL HFA;VENTOLIN HFA) 108 (90 BASE) MCG/ACT inhaler Inhale 2 puffs into the lungs every 4 (four) hours as needed. 01/20/11  Yes Vida Roller, MD  denosumab (PROLIA) 60 MG/ML SOLN injection Inject 60 mg into the skin every 6 (six) months. Administer in upper arm, thigh, or abdomen. Dr Hoy Register office 04/15/11  Yes Newt Lukes, MD  losartan (COZAAR) 50 MG tablet Take 25 mg by mouth daily. 09/06/11  Yes Newt Lukes, MD  NON FORMULARY Adult Gummy multivitamin take 2 daily    Yes Historical Provider, MD  tiotropium (SPIRIVA) 18 MCG inhalation capsule Place 18 mcg into inhaler and inhale daily.   Yes Historical Provider, MD  traMADol (ULTRAM) 50 MG tablet Take 50 mg by mouth every 6 (six) hours as needed for moderate pain.   Yes Historical Provider, MD  vitamin D, CHOLECALCIFEROL, 400 UNITS tablet Take 1,200 Units by mouth daily. 09/06/11  Yes Newt Lukes, MD    Physical Exam: Filed Vitals:   01/03/13 1550 01/03/13 1559 01/03/13 1848  BP: 151/77  116/55  Pulse: 108  109  Temp:  98 F (36.7 C)  99.4 F (37.4 C)  TempSrc: Oral  Oral  Resp: 18  22  Height:  4\' 11"  (1.499 m)   Weight:  37.195 kg (82 lb)   SpO2: 95%  90%     General:  Well-developed and moderately nourished.  Eyes: Anicteric no pallor.  ENT: No discharge from ears eyes nose mouth.  Neck: No mass felt.  Cardiovascular: S1-S2 heard.  Respiratory: Mild expiratory wheeze heard. No crepitations.  Abdomen: Soft nontender bowel sounds present.  Skin: No rash.  Musculoskeletal: No edema.  Psychiatric: Appears normal.  Neurologic: Alert awake oriented  to time place and person. Moves all extremities.  Labs on Admission:  Basic Metabolic Panel:  Recent Labs Lab 01/03/13 1643  NA 135  K 3.0*  CL 93*  CO2 29  GLUCOSE 107*  BUN 16  CREATININE 0.48*  CALCIUM 9.0   Liver Function Tests: No results found for this basename: AST, ALT, ALKPHOS, BILITOT, PROT, ALBUMIN,  in the last 168 hours No results found for this basename: LIPASE, AMYLASE,  in the last 168 hours No results found for this basename: AMMONIA,  in the last 168 hours CBC:  Recent Labs Lab 01/03/13 1643  WBC 12.2*  NEUTROABS 8.6*  HGB 11.4*  HCT 34.2*  MCV 95.8  PLT 318   Cardiac Enzymes: No results found for this basename: CKTOTAL, CKMB, CKMBINDEX, TROPONINI,  in the last 168 hours  BNP (last 3 results)  Recent Labs  01/13/12 1942  PROBNP 2922.0*   CBG: No results found for this basename: GLUCAP,  in the last 168 hours  Radiological Exams on Admission: Dg Chest 2 View  01/03/2013   CLINICAL DATA:  Cough and shortness of breath.  EXAM: CHEST  2 VIEW  COMPARISON:  01/30/2012.  FINDINGS: Mediastinum and hilar structures are normal. Heart size normal. Pulmonary vascularity normal. Diffuse pulmonary interstitial changes noted consistent with interstitial fibrosis again noted. Bullous COPD. No significant pleural effusion or pneumothorax. Minimal infiltrate left lung base cannot be excluded. No acute bony abnormality. Old bilateral rib fractures are noted. Prior thoracic spine kyphoplasty. Diffuse thoracic spine osteopenia and degenerative change.  IMPRESSION: 1. Minimal infiltrate left lung base cannot be excluded. 2. Severe pulmonary interstitial fibrosis in COPD.   Electronically Signed   By: Maisie Fus  Register   On: 01/03/2013 14:17   Ct Angio Chest W/cm &/or Wo Cm  01/03/2013   CLINICAL DATA:  Shortness of breath. COPD. Home oxygen. Left arm pain. Back pain with cough. Elevated D-dimer.  EXAM: CT ANGIOGRAPHY CHEST WITH CONTRAST  TECHNIQUE: Multidetector CT  imaging of the chest was performed using the standard protocol during bolus administration of intravenous contrast. Multiplanar CT image reconstructions including MIPs were obtained to evaluate the vascular anatomy.  CONTRAST:  60mL OMNIPAQUE IOHEXOL 350 MG/ML SOLN  COMPARISON:  01/03/2013  FINDINGS: No filling defect is identified in the pulmonary arterial tree to suggest pulmonary embolus. Atherosclerotic calcification of the aortic arch noted. No aortic dissection observed.  Right hilar node short axis 0.8 cm. Right infrahilar node short axis 1.0 cm. Left infrahilar node short axis is 0.7 cm.  Coronary artery atherosclerotic calcification.  Markedly severe centrilobular emphysema. Dependent airspace opacities in both lower lobes have some associated volume loss but also some airspace filling process potentially reflecting aspiration pneumonitis or lower lobe pneumonia. There is mild atelectasis in the lingula and some interstitial and patchy ground-glass opacity anteriorly in the right upper lobe.  Compression fractures observed at T10  and T12 with prior vertebral augmentations. Left rib deformities due to old fractures.  Review of the MIP images confirms the above findings.  IMPRESSION: 1. No acute pulmonary embolus or thoracic aortic dissection. 2. Bibasilar airspace opacities mixed in with volume loss in both lower lobes. Cannot exclude aspiration pneumonitis or lower lobe pneumonia. 3. Localized inflammatory appearing interstitial accentuation and patchy ground-glass opacity anteriorly in the right upper lobe. 4. Coronary artery atherosclerosis. 5. Right hilar and bilateral infrahilar lymph nodes are likely reactive. 6. Markedly severe emphysema.   Electronically Signed   By: Herbie Baltimore M.D.   On: 01/03/2013 18:58    EKG: Independently reviewed. Sinus tachycardia. Assessment/Plan Active Problems:   CAP (community acquired pneumonia)   COPD exacerbation   Neck pain   HTN (hypertension)    Pneumonia   1. Pneumonia - continue Levaquin. Check urine strep antigen and Legionella. Check influenza PCR. 2. COPD exacerbation - continue nebulizer and Pulmicort. If wheezing persists may continue IV steroids. 3. Neck pain - check x-ray C-spine. 4. Hypertension - continue Cozaar. 5. Chronic anemia - follow CBC.    Code Status: Full code.  Family Communication: None.  Disposition Plan: Admit to inpatient.    Anjolie Majer N. [facility] Pager [phone].  If 7PM-7AM, please contact night-coverage [email or web address] Password [email or web address] 01/03/2013, 8:50 PM

## 2013-01-03 NOTE — ED Provider Notes (Signed)
CSN: [id number]     Arrival date & time 01/03/13  1328 History   First MD Initiated Contact with Patient 01/03/13 1338     Chief Complaint  Patient presents with  . Cough   (Consider location/radiation/quality/duration/timing/severity/associated sxs/prior Treatment) Patient is a 79 y.o. female presenting with shortness of breath. The history is provided by the patient and the spouse.  Shortness of Breath Severity:  Moderate Onset quality:  Gradual Duration:  5 days Progression:  Worsening Chronicity:  Chronic Context: URI   Worsened by:  Activity and exertion Ineffective treatments:  Oxygen Associated symptoms: chest pain, cough, fever and sputum production     Past Medical History  Diagnosis Date  . DIVERTICULOSIS, COLON   . VITAMIN D DEFICIENCY   . HYPERLIPIDEMIA   . HYPERTENSION   . OSTEOPOROSIS   . COPD (chronic obstructive pulmonary disease)     PFTs 02/10/12: mild-mod   Past Surgical History  Procedure Laterality Date  . Cystoscopy  07/2005  . Vertebroplasty  03/2005    Dr. Deanne Coffer   Family History  Problem Relation Age of Onset  . Heart disease Mother   . Hypertension Mother   . Heart disease Father   . Hypertension Father    History  Substance Use Topics  . Smoking status: Former Smoker -- 0.30 packs/day for 31 years    Types: Cigarettes    Start date: 02/04/1950    Quit date: 02/04/1981  . Smokeless tobacco: Never Used     Comment: Married, lives with spouse. retired  . Alcohol Use: Not on file   OB History   Grav Para Term Preterm Abortions TAB SAB Ect Mult Living                 Review of Systems  Constitutional: Positive for fever.  HENT: Positive for congestion and rhinorrhea.   Respiratory: Positive for cough, sputum production and shortness of breath.   Cardiovascular: Positive for chest pain. Negative for leg swelling.    Allergies  Aspirin; Codeine; Penicillins; Shrimp; and Tomato  Home Medications   Current Outpatient Rx  Name   Route  Sig  Dispense  Refill  . albuterol (PROVENTIL HFA;VENTOLIN HFA) 108 (90 BASE) MCG/ACT inhaler   Inhalation   Inhale 2 puffs into the lungs every 4 (four) hours as needed.         Marland Kitchen denosumab (PROLIA) 60 MG/ML SOLN injection   Subcutaneous   Inject 60 mg into the skin every 6 (six) months. Administer in upper arm, thigh, or abdomen. Dr Hoy Register office   1 mL      . HYDROcodone-homatropine (HYCODAN) 5-1.5 MG/5ML syrup   Oral   Take 5 mLs by mouth at bedtime as needed for cough.   120 mL   0   . losartan (COZAAR) 50 MG tablet   Oral   Take 25 mg by mouth daily.         . NON FORMULARY      Adult Gummy multivitamin take 2 daily          . SPIRIVA HANDIHALER 18 MCG inhalation capsule      TAKE 1 CAPSULE BY MOUTH ONCE A DAY USING HANDIHALER   30 capsule   11   . traMADol (ULTRAM) 50 MG tablet   Oral   Take 1 tablet (50 mg total) by mouth every 6 (six) hours as needed for pain. Pain   30 tablet   1   . vitamin D, CHOLECALCIFEROL, 400 UNITS  tablet   Oral   Take 1,200 Units by mouth daily.          BP 162/92  Pulse 110  Temp(Src) 99.2 F (37.3 C) (Oral)  Resp 26  SpO2 88% Physical Exam  Nursing note and vitals reviewed. Constitutional: She is oriented to person, place, and time. She appears well-developed and well-nourished.  HENT:  Head: Normocephalic.  Right Ear: External ear normal.  Left Ear: External ear normal.  Mouth/Throat: Oropharynx is clear and moist.  Eyes: Pupils are equal, round, and reactive to light.  Neck: Normal range of motion. Neck supple.  Cardiovascular: Normal heart sounds.   Pulmonary/Chest: She is in respiratory distress. She has decreased breath sounds. She exhibits tenderness.  Musculoskeletal: She exhibits no edema.  Lymphadenopathy:    She has no cervical adenopathy.  Neurological: She is alert and oriented to person, place, and time.  Skin: Skin is warm and dry.    ED Course  Procedures (including critical care  time) Labs Review Labs Reviewed - No data to display Imaging Review Dg Chest 2 View  01/03/2013   CLINICAL DATA:  Cough and shortness of breath.  EXAM: CHEST  2 VIEW  COMPARISON:  01/30/2012.  FINDINGS: Mediastinum and hilar structures are normal. Heart size normal. Pulmonary vascularity normal. Diffuse pulmonary interstitial changes noted consistent with interstitial fibrosis again noted. Bullous COPD. No significant pleural effusion or pneumothorax. Minimal infiltrate left lung base cannot be excluded. No acute bony abnormality. Old bilateral rib fractures are noted. Prior thoracic spine kyphoplasty. Diffuse thoracic spine osteopenia and degenerative change.  IMPRESSION: 1. Minimal infiltrate left lung base cannot be excluded. 2. Severe pulmonary interstitial fibrosis in COPD.   Electronically Signed   By: Maisie Fus  Register   On: 01/03/2013 14:17    EKG Interpretation    Date/Time:    Ventricular Rate:    PR Interval:    QRS Duration:   QT Interval:    QTC Calculation:   R Axis:     Text Interpretation:              MDM  Sent for further eval and treatment , poss hosp of exacerbation of severe pulm lung disease.    Linna Hoff, MD 01/03/13 [phone]

## 2013-01-03 NOTE — ED Notes (Signed)
Awaiting for xray to be completed before transfer; x-ray notified.

## 2013-01-03 NOTE — Progress Notes (Signed)
Received report from ED.  

## 2013-01-03 NOTE — ED Notes (Signed)
Pt  Reports  Symptoms  Of  Cough  Congestion  Back  Pain          Symptoms    X   5  Days        Uses  Oxygen  At  Home  The  Pain is  Worse  When  She  Coughs

## 2013-01-04 DIAGNOSIS — M542 Cervicalgia: Secondary | ICD-10-CM

## 2013-01-04 DIAGNOSIS — R531 Weakness: Secondary | ICD-10-CM | POA: Diagnosis present

## 2013-01-04 DIAGNOSIS — J449 Chronic obstructive pulmonary disease, unspecified: Secondary | ICD-10-CM

## 2013-01-04 DIAGNOSIS — J9610 Chronic respiratory failure, unspecified whether with hypoxia or hypercapnia: Secondary | ICD-10-CM

## 2013-01-04 DIAGNOSIS — E860 Dehydration: Secondary | ICD-10-CM

## 2013-01-04 LAB — CBC WITH DIFFERENTIAL/PLATELET
Basophils Relative: 1 % (ref 0–1)
Eosinophils Absolute: 0.1 10*3/uL (ref 0.0–0.7)
Eosinophils Relative: 1 % (ref 0–5)
Hemoglobin: 11.3 g/dL — ABNORMAL LOW (ref 12.0–15.0)
Lymphs Abs: 1.3 10*3/uL (ref 0.7–4.0)
MCH: 31.9 pg (ref 26.0–34.0)
MCHC: 33.2 g/dL (ref 30.0–36.0)
MCV: 96 fL (ref 78.0–100.0)
Monocytes Relative: 16 % — ABNORMAL HIGH (ref 3–12)
Neutro Abs: 7.5 10*3/uL (ref 1.7–7.7)
Neutrophils Relative %: 70 % (ref 43–77)
Platelets: 323 10*3/uL (ref 150–400)
RBC: 3.54 MIL/uL — ABNORMAL LOW (ref 3.87–5.11)
WBC: 10.6 10*3/uL — ABNORMAL HIGH (ref 4.0–10.5)

## 2013-01-04 LAB — COMPREHENSIVE METABOLIC PANEL
ALT: 11 U/L (ref 0–35)
AST: 25 U/L (ref 0–37)
Alkaline Phosphatase: 78 U/L (ref 39–117)
CO2: 30 mEq/L (ref 19–32)
Calcium: 8.5 mg/dL (ref 8.4–10.5)
GFR calc Af Amer: >90 mL/min (ref >90)
GFR calc non Af Amer: 90 mL/min — ABNORMAL LOW (ref >90)
Glucose, Bld: 86 mg/dL (ref 70–99)
Potassium: 3.4 mEq/L — ABNORMAL LOW (ref 3.5–5.1)
Sodium: 138 mEq/L (ref 135–145)

## 2013-01-04 LAB — MAGNESIUM: Magnesium: 2.1 mg/dL (ref 1.5–2.5)

## 2013-01-04 LAB — INFLUENZA PANEL BY PCR (TYPE A & B): H1N1 flu by pcr: NOT DETECTED

## 2013-01-04 MED ORDER — ENSURE COMPLETE PO LIQD: 237.0000 mL | Freq: Every day | ORAL | Status: DC | PRN

## 2013-01-04 MED ORDER — POTASSIUM CHLORIDE IN NACL 40-0.9 MEQ/L-% IV SOLN
INTRAVENOUS | Status: AC
  Administered 2013-01-04 – 2013-01-05 (×3): via INTRAVENOUS
  Filled 2013-01-04 (×6): qty 1000

## 2013-01-04 MED ORDER — ALBUTEROL SULFATE (5 MG/ML) 0.5% IN NEBU
2.5000 mg | INHALATION_SOLUTION | Freq: Three times a day (TID) | RESPIRATORY_TRACT | Status: DC
  Administered 2013-01-05 – 2013-01-08 (×10): 2.5 mg via RESPIRATORY_TRACT
  Filled 2013-01-04 (×10): qty 0.5

## 2013-01-04 MED ORDER — IPRATROPIUM BROMIDE 0.02 % IN SOLN
0.5000 mg | Freq: Three times a day (TID) | RESPIRATORY_TRACT | Status: DC
  Administered 2013-01-05 – 2013-01-08 (×10): 0.5 mg via RESPIRATORY_TRACT
  Filled 2013-01-04 (×10): qty 2.5

## 2013-01-04 MED ORDER — HYDROCODONE-HOMATROPINE 5-1.5 MG/5ML PO SYRP
5.0000 mL | ORAL_SOLUTION | Freq: Four times a day (QID) | ORAL | Status: DC | PRN
  Administered 2013-01-04 – 2013-01-05 (×3): 5 mL via ORAL
  Filled 2013-01-04 (×3): qty 5

## 2013-01-04 NOTE — Progress Notes (Signed)
UR completed. Adaora Mchaney RN CCM Case Mgmt 

## 2013-01-04 NOTE — Progress Notes (Signed)
INITIAL NUTRITION ASSESSMENT  DOCUMENTATION CODES Per approved criteria  -Severe malnutrition in the context of chronic illness -Underweight   INTERVENTION: Add Ensure Complete po daily prn, each supplement provides 350 kcal and 13 grams of protein. Recommend liberalized, Regular diet. RD to continue to follow nutrition care plan.  NUTRITION DIAGNOSIS: Increased nutrient needs related to COPD as evidenced by estimated needs.   Goal: Intake to meet >90% of estimated nutrition needs.  Monitor:  weight trends, lab trends, I/O's, PO intake, supplement tolerance  Reason for Assessment: Malnutrition Screening Tool  79 y.o. female  Admitting Dx: PNA  ASSESSMENT: PMHx significant for COPD. Admitted with SOB and neck pain x 4 days. Work-up reveals PNA.  Currently on a Heart Healthy diet eating 10% of meals. Pt reports that she was eating well at home PTA. Didn't eat well on Sunday because she wasn't feeling well and was admitted to the hospital. Says that she eats 3 meals a day at home and sometimes drinks Ensure with her breakfast. Pt declined supplements at this time, she states that she wants to try to eat better for lunch and dinner before trying Ensure. Doesn't snack between meals and doesn't want supplements between meals.  Nutrition Focused Physical Exam:  Subcutaneous Fat:  Orbital Region: severe depletion Upper Arm Region: severe depletion Thoracic and Lumbar Region: severe depletion  Muscle:  Temple Region: severe depletion Clavicle Bone Region: severe depletion Clavicle and Acromion Bone Region: severe depletion Scapular Bone Region: severe depletion Dorsal Hand: severe depletion Patellar Region: severe depletion Anterior Thigh Region: severe depletion Posterior Calf Region: severe depletion  Edema: none  Pt meets criteria for severe MALNUTRITION in the context of chronic illness as evidenced by severe fat and muscle mass loss.  Height: Ht Readings from Last 1  Encounters:  01/03/13 4\' 11"  (1.499 m)    Weight: Wt Readings from Last 1 Encounters:  01/03/13 82 lb (37.195 kg)    Ideal Body Weight: 98 lb  % Ideal Body Weight: 84%  Wt Readings from Last 10 Encounters:  01/03/13 82 lb (37.195 kg)  03/12/12 75 lb (34.02 kg)  02/19/12 75 lb 6.4 oz (34.201 kg)  01/30/12 74 lb (33.566 kg)  01/30/12 73 lb 1.9 oz (33.167 kg)  01/17/12 79 lb 14.4 oz (36.242 kg)  01/06/12 75 lb (34.02 kg)  12/17/11 76 lb (34.473 kg)  12/05/11 77 lb 3.2 oz (35.018 kg)  10/25/11 79 lb 8 oz (36.061 kg)    Usual Body Weight: 75 lb  % Usual Body Weight: 109%  BMI:  Body mass index is 16.55 kg/(m^2). Underweight  Estimated Nutritional Needs: Kcal: 1200 - 1400 Protein: 55 - 65 g Fluid: 1.2 - 1.4 liters  Skin: intact  Diet Order: Cardiac  EDUCATION NEEDS: -No education needs identified at this time   Intake/Output Summary (Last 24 hours) at 01/04/13 1131 Last data filed at 01/04/13 0900  Gross per 24 hour  Intake    120 ml  Output      0 ml  Net    120 ml    Last BM: 11/30  Labs:   Recent Labs Lab 01/03/13 1643 01/04/13 0645  NA 135 138  K 3.0* 3.4*  CL 93* 96  CO2 29 30  BUN 16 10  CREATININE 0.48* 0.49*  CALCIUM 9.0 8.5  MG  --  2.1  GLUCOSE 107* 86    CBG (last 3)  No results found for this basename: GLUCAP,  in the last 72 hours  Scheduled  Meds: . albuterol  2.5 mg Nebulization Q6H  . budesonide (PULMICORT) nebulizer solution  0.25 mg Nebulization BID  . cholecalciferol  1,200 Units Oral Daily  . enoxaparin (LOVENOX) injection  40 mg Subcutaneous QHS  . ipratropium  0.5 mg Nebulization Q6H  . [START ON 01/05/2013] levofloxacin (LEVAQUIN) IV  500 mg Intravenous Q48H  . losartan  25 mg Oral Daily  . pneumococcal 23 valent vaccine  0.5 mL Intramuscular Tomorrow-1000  . potassium chloride  40 mEq Oral Once  . sodium chloride  3 mL Intravenous Q12H  . sodium chloride  3 mL Intravenous Q12H    Continuous Infusions:   Past  Medical History  Diagnosis Date  . DIVERTICULOSIS, COLON   . VITAMIN D DEFICIENCY   . HYPERLIPIDEMIA   . HYPERTENSION   . OSTEOPOROSIS   . COPD (chronic obstructive pulmonary disease)     PFTs 02/10/12: mild-mod    Past Surgical History  Procedure Laterality Date  . Cystoscopy  07/2005  . Vertebroplasty  03/2005    Dr. Tommy Rainwater MS, RD, LDN Pager: [phone] After-hours pager: [phone]

## 2013-01-04 NOTE — Progress Notes (Signed)
Chart reviewed.  [facility] PROGRESS NOTE  Bianca [name]:[id number] DOB: 06-Dec-1930 DOA: 01/03/2013 PCP: No primary provider on file.  Assessment/Plan:    CAP (community acquired pneumonia): cont current    COPD GOLD II/ III stable    Chronic respiratory failure on home oxygen    Neck pain improved    HTN (hypertension)    Weakness: pt consult    Dehydration: start IVF  Hypolakemia: replete IV   Code Status: full Family Communication: none available Disposition Plan: home?   Consultants:  none  Procedures:    Antibiotics:  levaquin 11/30  HPI/Subjective: Weak. No dyspnea. Neck pain better. Not much cough.  Has no PCP  Objective: Filed Vitals:   01/04/13 1437  BP: 101/68  Pulse: 95  Temp: 99.6 F (37.6 C)  Resp: 22    Intake/Output Summary (Last 24 hours) at 01/04/13 1525 Last data filed at 01/04/13 0900  Gross per 24 hour  Intake    120 ml  Output      0 ml  Net    120 ml   Filed Weights   01/03/13 1559  Weight: 37.195 kg (82 lb)    Exam:   General:  Frail, weak oriented  HEENT: dry MM  Cardiovascular: RRR without MGR  Respiratory: CTA without WRR  Abdomen: S, NT, ND  Musculoskeletal: no CCE  Data Reviewed: Basic Metabolic Panel:  Recent Labs Lab 01/03/13 1643 01/04/13 0645  NA 135 138  K 3.0* 3.4*  CL 93* 96  CO2 29 30  GLUCOSE 107* 86  BUN 16 10  CREATININE 0.48* 0.49*  CALCIUM 9.0 8.5  MG  --  2.1   Liver Function Tests:  Recent Labs Lab 01/04/13 0645  AST 25  ALT 11  ALKPHOS 78  BILITOT 0.4  PROT 7.0  ALBUMIN 2.7*   No results found for this basename: LIPASE, AMYLASE,  in the last 168 hours No results found for this basename: AMMONIA,  in the last 168 hours CBC:  Recent Labs Lab 01/03/13 1643 01/04/13 0645  WBC 12.2* 10.6*  NEUTROABS 8.6* 7.5  HGB 11.4* 11.3*  HCT 34.2* 34.0*  MCV 95.8 96.0  PLT 318 323   Cardiac Enzymes: No results found for this basename: CKTOTAL, CKMB,  CKMBINDEX, TROPONINI,  in the last 168 hours BNP (last 3 results)  Recent Labs  01/13/12 1942  PROBNP 2922.0*   CBG: No results found for this basename: GLUCAP,  in the last 168 hours  No results found for this or any previous visit (from the past 240 hour(s)).   Studies: Dg Chest 2 View  01/03/2013   CLINICAL DATA:  Cough and shortness of breath.  EXAM: CHEST  2 VIEW  COMPARISON:  01/30/2012.  FINDINGS: Mediastinum and hilar structures are normal. Heart size normal. Pulmonary vascularity normal. Diffuse pulmonary interstitial changes noted consistent with interstitial fibrosis again noted. Bullous COPD. No significant pleural effusion or pneumothorax. Minimal infiltrate left lung base cannot be excluded. No acute bony abnormality. Old bilateral rib fractures are noted. Prior thoracic spine kyphoplasty. Diffuse thoracic spine osteopenia and degenerative change.  IMPRESSION: 1. Minimal infiltrate left lung base cannot be excluded. 2. Severe pulmonary interstitial fibrosis in COPD.   Electronically Signed   By: Maisie Fus  Register   On: 01/03/2013 14:17   Dg Cervical Spine 2 Or 3 Views  01/03/2013   CLINICAL DATA:  79 year old female with left side neck pain radiating to the shoulder. Initial encounter.  EXAM: CERVICAL SPINE -  2 VIEW  COMPARISON:  Cervical spine CT 12-13.  FINDINGS: AP and lateral view. Normal prevertebral soft tissue contour. Trace anterolisthesis of C3 on C4 and C4 on C5 is stable along with mild retrolisthesis of C5 on C6. Grossly preserved cervicothoracic junction alignment. Chronic lower cervical disc space loss. Mild right cervical carotid calcified atherosclerosis. AP cervical spine alignment within normal limits. Emphysema in the lung apices.  IMPRESSION: Stable height and alignment of cervical vertebrae. Chronic multilevel degenerative changes, as described on 01/06/2012.   Electronically Signed   By: Augusto Gamble M.D.   On: 01/03/2013 21:18   Ct Angio Chest W/cm &/or Wo  Cm  01/03/2013   CLINICAL DATA:  Shortness of breath. COPD. Home oxygen. Left arm pain. Back pain with cough. Elevated D-dimer.  EXAM: CT ANGIOGRAPHY CHEST WITH CONTRAST  TECHNIQUE: Multidetector CT imaging of the chest was performed using the standard protocol during bolus administration of intravenous contrast. Multiplanar CT image reconstructions including MIPs were obtained to evaluate the vascular anatomy.  CONTRAST:  60mL OMNIPAQUE IOHEXOL 350 MG/ML SOLN  COMPARISON:  01/03/2013  FINDINGS: No filling defect is identified in the pulmonary arterial tree to suggest pulmonary embolus. Atherosclerotic calcification of the aortic arch noted. No aortic dissection observed.  Right hilar node short axis 0.8 cm. Right infrahilar node short axis 1.0 cm. Left infrahilar node short axis is 0.7 cm.  Coronary artery atherosclerotic calcification.  Markedly severe centrilobular emphysema. Dependent airspace opacities in both lower lobes have some associated volume loss but also some airspace filling process potentially reflecting aspiration pneumonitis or lower lobe pneumonia. There is mild atelectasis in the lingula and some interstitial and patchy ground-glass opacity anteriorly in the right upper lobe.  Compression fractures observed at T10 and T12 with prior vertebral augmentations. Left rib deformities due to old fractures.  Review of the MIP images confirms the above findings.  IMPRESSION: 1. No acute pulmonary embolus or thoracic aortic dissection. 2. Bibasilar airspace opacities mixed in with volume loss in both lower lobes. Cannot exclude aspiration pneumonitis or lower lobe pneumonia. 3. Localized inflammatory appearing interstitial accentuation and patchy ground-glass opacity anteriorly in the right upper lobe. 4. Coronary artery atherosclerosis. 5. Right hilar and bilateral infrahilar lymph nodes are likely reactive. 6. Markedly severe emphysema.   Electronically Signed   By: Herbie Baltimore M.D.   On:  01/03/2013 18:58    Scheduled Meds: . albuterol  2.5 mg Nebulization Q6H  . budesonide (PULMICORT) nebulizer solution  0.25 mg Nebulization BID  . cholecalciferol  1,200 Units Oral Daily  . enoxaparin (LOVENOX) injection  40 mg Subcutaneous QHS  . ipratropium  0.5 mg Nebulization Q6H  . [START ON 01/05/2013] levofloxacin (LEVAQUIN) IV  500 mg Intravenous Q48H  . losartan  25 mg Oral Daily  . pneumococcal 23 valent vaccine  0.5 mL Intramuscular Tomorrow-1000  . potassium chloride  40 mEq Oral Once  . sodium chloride  3 mL Intravenous Q12H  . sodium chloride  3 mL Intravenous Q12H   Continuous Infusions: . 0.9 % NaCl with KCl 40 mEq / L     Time spent: 35 min    Dalyce Renne L  [facility] Pager [phone]. If 7PM-7AM, please contact night-coverage at [email or web address], password [facility] 01/04/2013, 3:25 PM  LOS: 1 day

## 2013-01-04 NOTE — Progress Notes (Signed)
Patient very uncomfortable upon arrival to floor. Hydrocan cough medicine allowing patient to rest. Patient did not want to comply with respiratory treatment. Patient stated she was afraid it would make her back hurt worse. Patient complied when explanation of treatment was given. Patient on 4 LPM via nasal cannula, with oxygen saturation of 90%.

## 2013-01-05 MED ORDER — CYCLOBENZAPRINE HCL 5 MG PO TABS
5.0000 mg | ORAL_TABLET | Freq: Once | ORAL | Status: AC
  Administered 2013-01-05: 16:00:00 5 mg via ORAL
  Filled 2013-01-05: qty 1

## 2013-01-05 MED ORDER — LEVOFLOXACIN 500 MG PO TABS
500.0000 mg | ORAL_TABLET | ORAL | Status: DC
  Administered 2013-01-05: 17:00:00 500 mg via ORAL
  Filled 2013-01-05 (×2): qty 1

## 2013-01-05 MED ORDER — BENZONATATE 100 MG PO CAPS
200.0000 mg | ORAL_CAPSULE | Freq: Three times a day (TID) | ORAL | Status: DC
  Administered 2013-01-05 – 2013-01-08 (×9): 200 mg via ORAL
  Filled 2013-01-05 (×11): qty 2

## 2013-01-05 MED ORDER — CYCLOBENZAPRINE HCL 10 MG PO TABS: 5.0000 mg | ORAL_TABLET | Freq: Three times a day (TID) | ORAL | Status: DC | PRN

## 2013-01-05 MED ORDER — BENZONATATE 100 MG PO CAPS
100.0000 mg | ORAL_CAPSULE | Freq: Three times a day (TID) | ORAL | Status: DC | PRN
  Filled 2013-01-05: qty 1

## 2013-01-05 MED ORDER — ENOXAPARIN SODIUM 30 MG/0.3ML ~~LOC~~ SOLN
30.0000 mg | Freq: Every day | SUBCUTANEOUS | Status: DC
  Administered 2013-01-05 – 2013-01-07 (×3): 30 mg via SUBCUTANEOUS
  Filled 2013-01-05 (×4): qty 0.3

## 2013-01-05 NOTE — Care Management Note (Signed)
    Page 1 of 2   01/08/2013     10:21:27 AM   CARE MANAGEMENT NOTE 01/08/2013  Patient:  Bianca [name], Bianca [name]   Account Number:  [id number]  Date Initiated:  01/04/2013  Documentation initiated by:  [facility]  Subjective/Objective Assessment:   pneumonia, COPD exacerbation     Action/Plan:   lives at home with husband. oxygen dependent  pt eval-hhpt   Anticipated DC Date:  01/08/2013   Anticipated DC Plan:  HOME W HOME HEALTH SERVICES      DC Planning Services  CM consult      [facility] Choice  HOME HEALTH   Choice offered to / List presented to:  C-1 Patient   DME arranged  NEBULIZER MACHINE      DME agency  [facility]     HH arranged  HH-2 PT  HH-3 OT  HH-7 RESPIRATORY THERAPY      HH agency  [facility]   Status of service:  Completed, signed off Medicare Important Message given?   (If response is "NO", the following Medicare IM given date fields will be blank) Date Medicare IM given:   Date Additional Medicare IM given:    Discharge Disposition:  HOME W HOME HEALTH SERVICES  Per UR Regulation:  Reviewed for med. necessity/level of care/duration of stay  If discussed at Long Length of Stay Meetings, dates discussed:    Comments:  01/08/13 10:19 Bianca Cape RN, BSN [phone] patient is for dc today, nebulizer machine ordered, Justin with [facility] has been notified, will bring up to room, patient has scripts for medication from MD.  Lupita Leash with [facility] notified as well for HHPT, OT and Resp therapy which will be done by an Charity fundraiser.  Informed RN that hh is set up and ready we are just waiting  for nebulizer machine to be delivered to room.  01/07/13 11:12 Bianca Cape RN, BSN [phone] patient sats decreased to 85 with ambulation yesterday and patient also started having diarrhea, patient has hx of c diff, will need to r/o cdiff.  NCM  will continue to follow for dc needs.  12/202/14 15:56 Bianca Cape RN, BSN [phone] patient lives with spouse,  patient chos [facility] from agency list for HHPT, she states she does not need a HHRN.  I asked if someone from [facility] came in to talk to her she stated no and she does not need this, NCM tried to explain [facility] to her, I informed her that if representative has not been by yet they will be coming by.  Patient's daughter Bianca [name] came in room and the patient states MD says she will be ready for dc tomorrow, daughter walked outside and started to cry.  I asked her what was wrong she states the patient is stubborn and her father will not be able to help her at home and she is not ready to be dc tomorrow, she would like to speak with MD.  NCM informed MD to call patient's daughter.  MD spoke with daughter.  MD states that Bianca [name] in the other daughter in [place] who will be the person to contact her number is [phone].

## 2013-01-05 NOTE — Progress Notes (Signed)
PHARMACIST - PHYSICIAN COMMUNICATION DR:   Lendell Caprice CONCERNING: Antibiotic IV to Oral Route Change Policy  RECOMMENDATION: This patient is receiving Levaquin by the intravenous route.  Based on criteria approved by the Pharmacy and Therapeutics Committee, the antibiotic(s) is/are being converted to the equivalent oral dose form(s).   DESCRIPTION: These criteria include:  Patient being treated for a respiratory tract infection, urinary tract infection, cellulitis or clostridium difficile associated diarrhea if on metronidazole  The patient is not neutropenic and does not exhibit a GI malabsorption state  The patient is eating (either orally or via tube) and/or has been taking other orally administered medications for a least 24 hours  The patient is improving clinically and has a Tmax < 100.5  If you have questions about this conversion, please contact the Pharmacy Department  []   [phone] )  [facility] [x]   [phone] )  [facility]  []   [phone] )  [facility] []   [phone] )  [facility]    Georgina Pillion, PharmD, BCPS Clinical Pharmacist Pager: [phone] 01/05/2013 2:41 PM

## 2013-01-05 NOTE — Progress Notes (Addendum)
[facility] PROGRESS NOTE  Bianca [name] YQM:[id number] DOB: Mar 16, 1930 DOA: 01/03/2013 PCP: No primary provider on file.  Assessment/Plan:    CAP (community acquired pneumonia): cont levaquin. Add tessalon tid. Home in 1-2 days.  Needs new PCP.   Confusion:  Per daughter has worsened within the past.  Likely secondary to pneumonia/hypoxia.    COPD GOLD II/ III stable    Chronic respiratory failure on home oxygen    Neck pain secondary to spasm. Heat. Low dose flexeril.    HTN (hypertension)  Decondition: PT rec home PT    Dehydration: continue IVF for 24 more hours.  Hypolakemia: recheck in am   Code Status: full Family Communication: none available. Left message with husband. FIRST CONTACT IS LEE DAWKINS, Huel Cote [phone] Disposition Plan: home with PT. ordered   Consultants:  none  Procedures:    Antibiotics:  levaquin 11/30  HPI/Subjective: C/o intractable cough and return of left neck pain. Couldn't sleep  Objective: Filed Vitals:   01/05/13 0530  BP: 99/62  Pulse: 83  Temp: 98.9 F (37.2 C)  Resp: 20    Intake/Output Summary (Last 24 hours) at 01/05/13 1423 Last data filed at 01/05/13 1027  Gross per 24 hour  Intake 1711.33 ml  Output      0 ml  Net 1711.33 ml   Filed Weights   01/03/13 1559 01/05/13 0405  Weight: 37.195 kg (82 lb) 40.2 kg (88 lb 10 oz)    Exam:   General:  Frail, weak oriented, but does not remember me or her diagnosis of  Pneumonia, which we discussed yesterday  HEENT: dry MM  Cardiovascular: RRR without MGR  Respiratory: CTA without WRR  Abdomen: S, NT, ND  Musculoskeletal: no CCE  Data Reviewed: Basic Metabolic Panel:  Recent Labs Lab 01/03/13 1643 01/04/13 0645  NA 135 138  K 3.0* 3.4*  CL 93* 96  CO2 29 30  GLUCOSE 107* 86  BUN 16 10  CREATININE 0.48* 0.49*  CALCIUM 9.0 8.5  MG  --  2.1   Liver Function Tests:  Recent Labs Lab 01/04/13 0645  AST 25  ALT 11  ALKPHOS 78   BILITOT 0.4  PROT 7.0  ALBUMIN 2.7*   No results found for this basename: LIPASE, AMYLASE,  in the last 168 hours No results found for this basename: AMMONIA,  in the last 168 hours CBC:  Recent Labs Lab 01/03/13 1643 01/04/13 0645  WBC 12.2* 10.6*  NEUTROABS 8.6* 7.5  HGB 11.4* 11.3*  HCT 34.2* 34.0*  MCV 95.8 96.0  PLT 318 323   Cardiac Enzymes: No results found for this basename: CKTOTAL, CKMB, CKMBINDEX, TROPONINI,  in the last 168 hours BNP (last 3 results)  Recent Labs  01/13/12 1942  PROBNP 2922.0*   CBG: No results found for this basename: GLUCAP,  in the last 168 hours  No results found for this or any previous visit (from the past 240 hour(s)).   Studies: Dg Cervical Spine 2 Or 3 Views  01/03/2013   CLINICAL DATA:  79 year old female with left side neck pain radiating to the shoulder. Initial encounter.  EXAM: CERVICAL SPINE - 2 VIEW  COMPARISON:  Cervical spine CT 12-13.  FINDINGS: AP and lateral view. Normal prevertebral soft tissue contour. Trace anterolisthesis of C3 on C4 and C4 on C5 is stable along with mild retrolisthesis of C5 on C6. Grossly preserved cervicothoracic junction alignment. Chronic lower cervical disc space loss. Mild right cervical carotid calcified atherosclerosis. AP cervical  spine alignment within normal limits. Emphysema in the lung apices.  IMPRESSION: Stable height and alignment of cervical vertebrae. Chronic multilevel degenerative changes, as described on 01/06/2012.   Electronically Signed   By: Augusto Gamble M.D.   On: 01/03/2013 21:18   Ct Angio Chest W/cm &/or Wo Cm  01/03/2013   CLINICAL DATA:  Shortness of breath. COPD. Home oxygen. Left arm pain. Back pain with cough. Elevated D-dimer.  EXAM: CT ANGIOGRAPHY CHEST WITH CONTRAST  TECHNIQUE: Multidetector CT imaging of the chest was performed using the standard protocol during bolus administration of intravenous contrast. Multiplanar CT image reconstructions including MIPs were  obtained to evaluate the vascular anatomy.  CONTRAST:  60mL OMNIPAQUE IOHEXOL 350 MG/ML SOLN  COMPARISON:  01/03/2013  FINDINGS: No filling defect is identified in the pulmonary arterial tree to suggest pulmonary embolus. Atherosclerotic calcification of the aortic arch noted. No aortic dissection observed.  Right hilar node short axis 0.8 cm. Right infrahilar node short axis 1.0 cm. Left infrahilar node short axis is 0.7 cm.  Coronary artery atherosclerotic calcification.  Markedly severe centrilobular emphysema. Dependent airspace opacities in both lower lobes have some associated volume loss but also some airspace filling process potentially reflecting aspiration pneumonitis or lower lobe pneumonia. There is mild atelectasis in the lingula and some interstitial and patchy ground-glass opacity anteriorly in the right upper lobe.  Compression fractures observed at T10 and T12 with prior vertebral augmentations. Left rib deformities due to old fractures.  Review of the MIP images confirms the above findings.  IMPRESSION: 1. No acute pulmonary embolus or thoracic aortic dissection. 2. Bibasilar airspace opacities mixed in with volume loss in both lower lobes. Cannot exclude aspiration pneumonitis or lower lobe pneumonia. 3. Localized inflammatory appearing interstitial accentuation and patchy ground-glass opacity anteriorly in the right upper lobe. 4. Coronary artery atherosclerosis. 5. Right hilar and bilateral infrahilar lymph nodes are likely reactive. 6. Markedly severe emphysema.   Electronically Signed   By: Herbie Baltimore M.D.   On: 01/03/2013 18:58    Scheduled Meds: . albuterol  2.5 mg Nebulization TID  . budesonide (PULMICORT) nebulizer solution  0.25 mg Nebulization BID  . cholecalciferol  1,200 Units Oral Daily  . enoxaparin (LOVENOX) injection  30 mg Subcutaneous QHS  . ipratropium  0.5 mg Nebulization TID  . levofloxacin (LEVAQUIN) IV  500 mg Intravenous Q48H  . losartan  25 mg Oral Daily   . pneumococcal 23 valent vaccine  0.5 mL Intramuscular Tomorrow-1000  . potassium chloride  40 mEq Oral Once  . sodium chloride  3 mL Intravenous Q12H  . sodium chloride  3 mL Intravenous Q12H   Continuous Infusions: . 0.9 % NaCl with KCl 40 mEq / L 125 mL/hr at 01/05/13 0156   Time spent: 35 min  Jaynell Castagnola L  [facility] Pager [phone]. If 7PM-7AM, please contact night-coverage at [email or web address], password [facility] 01/05/2013, 2:23 PM  LOS: 2 days

## 2013-01-05 NOTE — Evaluation (Signed)
Physical Therapy Evaluation Patient Details Name: HILIARY OSORTO MRN: [id number] DOB: Jun 08, 1930 Today's Date: 01/05/2013 Time: 1191-4782 PT Time Calculation (min): 34 min  PT Assessment / Plan / Recommendation History of Present Illness  Pt. is an 79 y/o female admitted for SOB. Found to have CAP.  Clinical Impression  Pt admitted with SOB and CAP. Pt currently with functional limitations due to the deficits listed below (see PT Problem List). Pt will benefit from skilled PT to increase their independence and safety with mobility to allow discharge to the venue listed below. Recommend HHPT services at d/c.    PT Assessment  Patient needs continued PT services    Follow Up Recommendations  Home health PT;Supervision/Assistance - 24 hour    Does the patient have the potential to tolerate intense rehabilitation      Barriers to Discharge        Equipment Recommendations  None recommended by PT    Recommendations for Other Services     Frequency Min 3X/week    Precautions / Restrictions Precautions Precautions: Fall Restrictions Weight Bearing Restrictions: No   Pertinent Vitals/Pain Denied pain      Mobility  Bed Mobility Bed Mobility: Supine to Sit Supine to Sit: 6: Modified independent (Device/Increase time);HOB elevated;With rails Details for Bed Mobility Assistance: HOB 30 degrees Transfers Transfers: Sit to Stand;Stand to Sit Sit to Stand: 5: Supervision;From bed;With upper extremity assist Stand to Sit: 5: Supervision;To chair/3-in-1;With upper extremity assist Ambulation/Gait Ambulation/Gait Assistance: 4: Min assist Ambulation Distance (Feet): 30 Feet Assistive device: 1 person hand held assist Ambulation/Gait Assistance Details: mildly unsteady with frequent reaching for objects for UE support Gait Pattern: Decreased step length - right;Decreased step length - left Gait velocity: decreased Stairs: No Wheelchair Mobility Wheelchair Mobility: No     Exercises     PT Diagnosis: Difficulty walking;Generalized weakness  PT Problem List: Decreased strength;Decreased activity tolerance;Decreased balance;Decreased mobility;Decreased knowledge of use of DME PT Treatment Interventions: DME instruction;Gait training;Stair training;Therapeutic activities;Functional mobility training;Therapeutic exercise;Balance training;Neuromuscular re-education;Patient/family education     PT Goals(Current goals can be found in the care plan section) Acute Rehab PT Goals Patient Stated Goal: to go home PT Goal Formulation: With patient Time For Goal Achievement: 01/12/13 Potential to Achieve Goals: Good  Visit Information  Last PT Received On: 01/05/13 Assistance Needed: +1 History of Present Illness: Pt. is an 79 y/o female admitted for SOB. Found to have CAP.       Prior Functioning  Home Living Family/patient expects to be discharged to:: Private residence Living Arrangements: Spouse/significant other Available Help at Discharge: Family;Available 24 hours/day Type of Home: House Home Access: Stairs to enter [employer] of Steps: 3 Entrance Stairs-Rails: Right;Left Home Layout: Multi-level;Able to live on main level with bedroom/bathroom Home Equipment: Dan Humphreys - 2 wheels;Cane - quad Prior Function Level of Independence: Independent Communication Communication: No difficulties    Cognition  Cognition Arousal/Alertness: Awake/alert Behavior During Therapy: WFL for tasks assessed/performed Overall Cognitive Status: Within Functional Limits for tasks assessed    Extremity/Trunk Assessment Upper Extremity Assessment Upper Extremity Assessment: Generalized weakness Lower Extremity Assessment Lower Extremity Assessment: Generalized weakness Cervical / Trunk Assessment Cervical / Trunk Assessment: Kyphotic   Balance Balance Balance Assessed: Yes Static Sitting Balance Static Sitting - Balance Support: Feet unsupported;No upper  extremity supported Static Sitting - Level of Assistance: 5: Stand by assistance Static Sitting - Comment/# of Minutes: 5 Static Standing Balance Static Standing - Balance Support: During functional activity;Right upper extremity supported Static Standing - Level  of Assistance: 4: Min assist  End of Session PT - End of Session Equipment Utilized During Treatment: Gait belt;Oxygen Activity Tolerance: Patient tolerated treatment well Patient left: in chair;with call bell/phone within reach Nurse Communication: Mobility status  GP     Moshe Cipro K 01/05/2013, 2:27 PM  Clarita Crane, PT, DPT [phone]

## 2013-01-06 LAB — BASIC METABOLIC PANEL
BUN: 4 mg/dL — ABNORMAL LOW (ref 6–23)
Calcium: 7.8 mg/dL — ABNORMAL LOW (ref 8.4–10.5)
Chloride: 103 mEq/L (ref 96–112)
GFR calc Af Amer: >90 mL/min (ref >90)
Potassium: 5.3 mEq/L — ABNORMAL HIGH (ref 3.5–5.1)

## 2013-01-06 MED ORDER — PREDNISONE 50 MG PO TABS
60.0000 mg | ORAL_TABLET | Freq: Every day | ORAL | Status: DC
  Administered 2013-01-06 – 2013-01-08 (×3): 60 mg via ORAL
  Filled 2013-01-06 (×4): qty 1

## 2013-01-06 NOTE — Progress Notes (Signed)
Physical Therapy Treatment Patient Details Name: Bianca [name] MRN: [id number] DOB: 10/31/1930 Today's Date: 01/06/2013 Time: 4098-1191 PT Time Calculation (min): 39 min  PT Assessment / Plan / Recommendation  History of Present Illness Pt. is an 79 y/o female admitted for SOB. Found to have CAP.   PT Comments   Pt able increase ambulation distance this session with use of RW.  RW assist with energy conservation and pt agrees.  Educated pt on minimize talking during ambulation due to decrease in stats and increase in SOB.  Pt agrees.  Pt ambulation with 2L O2 with initial Sa02 > 90% pt decreases to 85% ~50' ambulation however able to stand and quickly increase SaO2 > 90 % all on 2L O2.     Follow Up Recommendations  Home health PT;Supervision/Assistance - 24 hour     Does the patient have the potential to tolerate intense rehabilitation     Barriers to Discharge        Equipment Recommendations  None recommended by PT    Recommendations for Other Services    Frequency Min 3X/week   Progress towards PT Goals Progress towards PT goals: Progressing toward goals  Plan Current plan remains appropriate    Precautions / Restrictions Precautions Precautions: Fall Restrictions Weight Bearing Restrictions: No   Pertinent Vitals/Pain No c/o pain    Mobility  Bed Mobility Bed Mobility: Supine to Sit Supine to Sit: 6: Modified independent (Device/Increase time);HOB elevated;With rails Details for Bed Mobility Assistance: HOB 30 degrees Transfers Transfers: Sit to Stand;Stand to Sit Sit to Stand: 5: Supervision;From bed;With upper extremity assist Stand to Sit: 5: Supervision;To chair/3-in-1;With upper extremity assist Ambulation/Gait Ambulation/Gait Assistance: 4: Min assist Ambulation Distance (Feet): 125 Feet Assistive device: Rolling walker Ambulation/Gait Assistance Details: Increased ambulation distance with use of RW to conserve energy.  Gait Pattern: Decreased step length  - right;Decreased step length - left Gait velocity: decreased Stairs: No Wheelchair Mobility Wheelchair Mobility: No    Exercises General Exercises - Lower Extremity Ankle Circles/Pumps: AAROM;Both;10 reps Long Arc Quad: Strengthening;Both;10 reps Hip ABduction/ADduction: Strengthening;Both;10 reps Straight Leg Raises: Strengthening;Both;10 reps Hip Flexion/Marching: Strengthening;Both;10 reps   PT Diagnosis:    PT Problem List:   PT Treatment Interventions:     PT Goals (current goals can now be found in the care plan section) Acute Rehab PT Goals Patient Stated Goal: to go home PT Goal Formulation: With patient Time For Goal Achievement: 01/12/13 Potential to Achieve Goals: Good  Visit Information  Last PT Received On: 01/06/13 Assistance Needed: +1 History of Present Illness: Pt. is an 79 y/o female admitted for SOB. Found to have CAP.    Subjective Data  Subjective: "I'm feeling better after we walk.: Patient Stated Goal: to go home   Cognition  Cognition Arousal/Alertness: Awake/alert Behavior During Therapy: WFL for tasks assessed/performed Overall Cognitive Status: Within Functional Limits for tasks assessed    Balance  Balance Balance Assessed: Yes Static Sitting Balance Static Sitting - Balance Support: Feet unsupported;No upper extremity supported Static Sitting - Level of Assistance: 5: Stand by assistance Static Standing Balance Static Standing - Balance Support: During functional activity;Right upper extremity supported Static Standing - Level of Assistance: 4: Min assist  End of Session PT - End of Session Equipment Utilized During Treatment: Gait belt;Oxygen Activity Tolerance: Patient tolerated treatment well Patient left: in chair;with call bell/phone within reach Nurse Communication: Mobility status   GP     Janzen Sacks 01/06/2013, 10:37 AM  Jake Shark, PT DPT [phone]

## 2013-01-06 NOTE — Progress Notes (Signed)
[facility] PROGRESS NOTE  Bianca [name] ZOX:[id number] DOB: Jan 06, 1931 DOA: 01/03/2013 PCP: No primary provider on file.   HPI/Subjective: Bianca [name] is a 79 y.o. female with known history of COPD presented to the ER because of shortness of breath and neck pain. Patient has been having these symptoms over the last 4 days which has been gradually worsening. Patient also has been having some productive cough. Denies any fever chills chest pain nausea vomiting or diarrhea. Denies any fall or trauma. In the ER patient had CT angiogram of the chest which was negative for PE but did show possible pneumonia. Patient was placed on nebulizer and antibiotics and has been admitted for further management.  Patient ambulated down the hallway, desatted to 86% with ambulation, appearing to be in mild to moderate respiratory distress. However she does report feeling better overall since admission.    Active Problems:   COPD GOLD II/ III   Chronic respiratory failure   CAP (community acquired pneumonia)   Neck pain   HTN (hypertension)   Weakness   Dehydration   Assessment/Plan: 1. CAP -  Etiology unknown, chest x ray shows pna, urine neg for legionella and strep - influenza neg - on levaquin 500mg  PO, slightly febrile 99.3, WBC coming down to 10.6 on 01/05/13.  - tessalon for cough - received pneumo vax while here  2. COPD GOLD II/III/ Chronic Respiratory failure - pt has extensive smoking history - on O2 2 L sats in low 90s sitting - Resp therapy working with patient giving nebs and pulmicort - starting prednisone 60mg  PO q day   3. Weakness - 2/2 to pneumonia and dehydration - improving with time - pt receiving ensure feeding supplement -PT consulted  4. Dehydration - Improved, she is tolerating PO, I will discontinue IV fluids  5. Neck Pain - likely muscular in origin - neck x ray showed DJD, no other abnormalities - pt given flexeril and tramadol; tramadol seems to be  helping  6. HTN - continue home meds (cozaar)   DVT prophylaxis: lovenox      Code Status: FULL Family Communication: Disposition Plan: Slowly improving, anticipate d/c to home in the next 24-48 hours.    Consultants:  Resp therapy  PT  Procedures: CT angiogram Neck radiograph resp therapy PT  Antibiotics: Antibiotics Given (last 72 hours)   Date/Time Action Medication Dose   01/05/13 1719 Given   levofloxacin (LEVAQUIN) tablet 500 mg 500 mg      Objective: Filed Vitals:   01/06/13 0629  BP: 138/83  Pulse: 102  Temp: 99.3 F (37.4 C)  Resp: 25    Intake/Output Summary (Last 24 hours) at 01/06/13 1119 Last data filed at 01/05/13 1900  Gross per 24 hour  Intake 1572.92 ml  Output      0 ml  Net 1572.92 ml   Filed Weights   01/03/13 1559 01/05/13 0405 01/06/13 0644  Weight: 37.195 kg (82 lb) 40.2 kg (88 lb 10 oz) 41.4 kg (91 lb 4.3 oz)    Exam:   General: elderly female lying in bed, in nad  Cardiovascular: regular rhythm, tachycardic to low 100s, no m/g/r  Respiratory: soft breath sounds, not much air movement, lots of wet coughs, inc wob  Abdomen: +BS, nontender, nondistended, no organomegaly  Musculoskeletal: ROM intact x 4, no edema  Data Reviewed: Basic Metabolic Panel:  Recent Labs Lab 01/03/13 1643 01/04/13 0645 01/06/13 0645  NA 135 138 135  K 3.0* 3.4* 5.3*  CL  93* 96 103  CO2 29 30 22   GLUCOSE 107* 86 74  BUN 16 10 4*  CREATININE 0.48* 0.49* 0.40*  CALCIUM 9.0 8.5 7.8*  MG  --  2.1  --    Liver Function Tests:  Recent Labs Lab 01/04/13 0645  AST 25  ALT 11  ALKPHOS 78  BILITOT 0.4  PROT 7.0  ALBUMIN 2.7*   CBC:  Recent Labs Lab 01/03/13 1643 01/04/13 0645  WBC 12.2* 10.6*  NEUTROABS 8.6* 7.5  HGB 11.4* 11.3*  HCT 34.2* 34.0*  MCV 95.8 96.0  PLT 318 323    BNP (last 3 results)  Recent Labs  01/13/12 1942  PROBNP 2922.0*    Studies: No results found.  Scheduled Meds: . albuterol  2.5  mg Nebulization TID  . benzonatate  200 mg Oral TID  . budesonide (PULMICORT) nebulizer solution  0.25 mg Nebulization BID  . cholecalciferol  1,200 Units Oral Daily  . enoxaparin (LOVENOX) injection  30 mg Subcutaneous QHS  . ipratropium  0.5 mg Nebulization TID  . levofloxacin  500 mg Oral Q48H  . losartan  25 mg Oral Daily  . pneumococcal 23 valent vaccine  0.5 mL Intramuscular Tomorrow-1000  . predniSONE  60 mg Oral Q breakfast  . sodium chloride  3 mL Intravenous Q12H   Continuous Infusions:   Active Problems:   COPD GOLD II/ III   Chronic respiratory failure   CAP (community acquired pneumonia)   Neck pain   HTN (hypertension)   Weakness   Dehydration    Time spent: 35    Dorinda Hill, PA-S  [facility] Pager [phone] If 7PM-7AM, please contact night-coverage at [email or web address], password [facility] 01/06/2013, 11:19 AM  LOS: 3 days    I personally evaluated patient on 01/06/2013 with PA student. We ambulated her down the hallway on 2 L supplemental oxygen, patient desatting to 86% with activity. This quickly came up to lower 90s at rest. On lung exam, she continues to have diminished breath sounds bilaterally with few expiratory wheezes, I think she would benefit from steroid therapy, starting oral prednisone 60 mg by mouth daily. Meanwhile will continue with empiric antibiotic therapy with Levaquin. Physical therapy has been consulted, plan to ambulate again later today. Agree with the above findings.

## 2013-01-07 DIAGNOSIS — R197 Diarrhea, unspecified: Secondary | ICD-10-CM

## 2013-01-07 LAB — CLOSTRIDIUM DIFFICILE BY PCR: Toxigenic C. Difficile by PCR: NEGATIVE

## 2013-01-07 NOTE — Clinical Documentation Improvement (Signed)
To Hospitalist MD's, NP's and PA's  Per  nutritional evaluation patient meets criteria for "Severe malnutrition in the context of chronic illness, Underweight " please document appropriate secondary diagnosis. Thank you  Possible Clinical Conditions?  Severe Malnutrition   Protein Calorie Malnutrition Severe Protein Calorie Malnutrition Emaciation  Cachexia    Other Condition Cannot clinically determine  Risk Factors: COPD Exacerbation, PNA, BMI 16.55     Recommendation:  Add Ensure Complete po daily prn, each supplement provides 350 kcal and 13 grams of protein.   Thank You, Lavonda Jumbo ,RN Clinical Documentation Specialist:  [phone]  [facility]- Health Information Management

## 2013-01-07 NOTE — Progress Notes (Signed)
[facility] PROGRESS NOTE  CORYN MOSSO ZOX:[id number] DOB: 12/18/1930 DOA: 01/03/2013 PCP: No primary provider on file.   HPI/Subjective: Bianca [name] is a 79 y.o. female with known history of COPD presented to the ER because of shortness of breath and neck pain. Patient has been having these symptoms over the last 4 days which has been gradually worsening. Patient also has been having some productive cough. Denies any fever chills chest pain nausea vomiting or diarrhea. Denies any fall or trauma. In the ER patient had CT angiogram of the chest which was negative for PE but did show possible pneumonia. Patient was placed on nebulizer and antibiotics and has been admitted for further management.   On 01/07/13 she reports feeling much better and is asking to go home today. She did however report 2 episodes of diarrhea this morning. Pending stool for Cdiff   Active Problems:   COPD GOLD II/ III   Chronic respiratory failure   CAP (community acquired pneumonia)   Neck pain   HTN (hypertension)   Weakness   Dehydration   Assessment/Plan: 1. CAP -  Etiology unknown, chest x ray shows pna, urine neg for legionella and strep - influenza neg - Will stop Levaquin.  - tessalon for cough - received pneumo vax while here -Patient improved, ambulated to nurses station today with her walker.   2. COPD GOLD II/III/ Chronic Respiratory failure - pt has extensive smoking history - on O2 2 L sats in low 90s sitting - Resp therapy working with patient giving nebs and pulmicort - on prednisone 60mg  PO q day  3. Diarrhea -She has a h/o of Cdiff and has received antimicrobial therapy during this hospitalization.  -Checking stool for Cdiff   3. Weakness - 2/2 to pneumonia and dehydration - improving with time - pt receiving ensure feeding supplement -PT consulted, patient doing better as she ambulated down the hallway today  4. Dehydration - Improved, she is tolerating PO, I will  discontinue IV fluids  5. Neck Pain - likely muscular in origin - neck x ray showed DJD, no other abnormalities - pt given flexeril and tramadol; tramadol seems to be helping  6. HTN - continue home meds (cozaar)   DVT prophylaxis: lovenox      Code Status: FULL Family Communication: Disposition Plan: Slowly improving, anticipate d/c to home in the next 24-48 hours.    Consultants:  Resp therapy  PT  Procedures: CT angiogram Neck radiograph resp therapy PT  Antibiotics: Antibiotics Given (last 72 hours)   Date/Time Action Medication Dose   01/05/13 1719 Given   levofloxacin (LEVAQUIN) tablet 500 mg 500 mg      Objective: Filed Vitals:   01/07/13 0638  BP: 127/77  Pulse:   Temp:   Resp:     Intake/Output Summary (Last 24 hours) at 01/07/13 1056 Last data filed at 01/06/13 2150  Gross per 24 hour  Intake      3 ml  Output      0 ml  Net      3 ml   Filed Weights   01/05/13 0405 01/06/13 0644 01/07/13 0606  Weight: 40.2 kg (88 lb 10 oz) 41.4 kg (91 lb 4.3 oz) 40 kg (88 lb 2.9 oz)    Exam:   General: elderly female lying in bed, in nad  Cardiovascular: regular rhythm, tachycardic to low 100s, no m/g/r  Respiratory: Improved lung exam, with better air movement today.   Abdomen: +BS, nontender, nondistended, no  organomegaly  Musculoskeletal: ROM intact x 4, no edema  Data Reviewed: Basic Metabolic Panel:  Recent Labs Lab 01/03/13 1643 01/04/13 0645 01/06/13 0645  NA 135 138 135  K 3.0* 3.4* 5.3*  CL 93* 96 103  CO2 29 30 22   GLUCOSE 107* 86 74  BUN 16 10 4*  CREATININE 0.48* 0.49* 0.40*  CALCIUM 9.0 8.5 7.8*  MG  --  2.1  --    Liver Function Tests:  Recent Labs Lab 01/04/13 0645  AST 25  ALT 11  ALKPHOS 78  BILITOT 0.4  PROT 7.0  ALBUMIN 2.7*   CBC:  Recent Labs Lab 01/03/13 1643 01/04/13 0645  WBC 12.2* 10.6*  NEUTROABS 8.6* 7.5  HGB 11.4* 11.3*  HCT 34.2* 34.0*  MCV 95.8 96.0  PLT 318 323    BNP (last  3 results)  Recent Labs  01/13/12 1942  PROBNP 2922.0*    Studies: No results found.  Scheduled Meds: . albuterol  2.5 mg Nebulization TID  . benzonatate  200 mg Oral TID  . budesonide (PULMICORT) nebulizer solution  0.25 mg Nebulization BID  . cholecalciferol  1,200 Units Oral Daily  . enoxaparin (LOVENOX) injection  30 mg Subcutaneous QHS  . ipratropium  0.5 mg Nebulization TID  . losartan  25 mg Oral Daily  . pneumococcal 23 valent vaccine  0.5 mL Intramuscular Tomorrow-1000  . predniSONE  60 mg Oral Q breakfast  . sodium chloride  3 mL Intravenous Q12H   Continuous Infusions:   Active Problems:   COPD GOLD II/ III   Chronic respiratory failure   CAP (community acquired pneumonia)   Neck pain   HTN (hypertension)   Weakness   Dehydration    Time spent: 35    Kenechukwu Eckstein,   [facility] Pager [phone] If 7PM-7AM, please contact night-coverage at [email or web address], password [facility] 01/07/2013, 10:56 AM  LOS: 4 days

## 2013-01-07 NOTE — Progress Notes (Signed)
Pt had 2 episodes of diarrhea this morning.

## 2013-01-08 DIAGNOSIS — E43 Unspecified severe protein-calorie malnutrition: Secondary | ICD-10-CM

## 2013-01-08 MED ORDER — PREDNISONE (PAK) 10 MG PO TABS: ORAL_TABLET | ORAL | Status: DC

## 2013-01-08 MED ORDER — IPRATROPIUM BROMIDE 0.02 % IN SOLN: 0.5000 mg | Freq: Three times a day (TID) | RESPIRATORY_TRACT | Status: DC

## 2013-01-08 MED ORDER — ACETAMINOPHEN 500 MG PO TABS: 1000.0000 mg | ORAL_TABLET | Freq: Three times a day (TID) | ORAL | Status: DC | PRN

## 2013-01-08 MED ORDER — ALBUTEROL SULFATE (5 MG/ML) 0.5% IN NEBU: 2.5000 mg | INHALATION_SOLUTION | RESPIRATORY_TRACT | Status: DC | PRN

## 2013-01-08 NOTE — Progress Notes (Signed)
Bianca [name] to be D/C'd Home per MD order.  Discussed with the patient and all questions fully answered.    Medication List         acetaminophen 500 MG tablet  Commonly known as:  TYLENOL  Take 2 tablets (1,000 mg total) by mouth every 8 (eight) hours as needed for mild pain.     albuterol 108 (90 BASE) MCG/ACT inhaler  Commonly known as:  PROVENTIL HFA;VENTOLIN HFA  Inhale 2 puffs into the lungs every 4 (four) hours as needed.     albuterol (5 MG/ML) 0.5% nebulizer solution  Commonly known as:  PROVENTIL  Take 0.5 mLs (2.5 mg total) by nebulization every 2 (two) hours as needed for wheezing.     denosumab 60 MG/ML Soln injection  Commonly known as:  PROLIA  Inject 60 mg into the skin every 6 (six) months. Administer in upper arm, thigh, or abdomen. Dr Hoy Register office     ipratropium 0.02 % nebulizer solution  Commonly known as:  ATROVENT  Take 2.5 mLs (0.5 mg total) by nebulization 3 (three) times daily.     losartan 50 MG tablet  Commonly known as:  COZAAR  Take 25 mg by mouth daily.     NON FORMULARY  Adult Gummy multivitamin take 2 daily     predniSONE 10 MG tablet  Commonly known as:  STERAPRED UNI-PAK  Take 6 tab PO q day x 2 days, then 5 tabs PO q day x 2 days, then 4 tab PO q day x 2 days, then 3 tab PO q day x 2 says, then 2 tab PO q day x 2 days, then 1 tab PO q day x 2 days then stop     tiotropium 18 MCG inhalation capsule  Commonly known as:  SPIRIVA  Place 18 mcg into inhaler and inhale daily.     traMADol 50 MG tablet  Commonly known as:  ULTRAM  Take 50 mg by mouth every 6 (six) hours as needed for moderate pain.     vitamin D (CHOLECALCIFEROL) 400 UNITS tablet  Take 1,200 Units by mouth daily.        VVS, Skin clean, dry and intact without evidence of skin break down, no evidence of skin tears noted. IV catheter discontinued intact. Site without signs and symptoms of complications. Dressing and pressure applied.  An After Visit Summary was  printed and given to the patient.  D/c education completed with patient/family including follow up instructions, medication list, d/c activities limitations if indicated, with other d/c instructions as indicated by MD - patient able to verbalize understanding, all questions fully answered.   Patient instructed to return to ED, call 911, or call MD for any changes in condition.   Patient escorted via WC, and D/C home via private auto.  Kennyth Arnold D 01/08/2013 11:24 AM

## 2013-01-08 NOTE — Discharge Summary (Signed)
Physician Discharge Summary  TRINI SOLDO WUJ:[id number] DOB: 01/01/1931 DOA: 01/03/2013  PCP: No primary provider on file.  Admit date: 01/03/2013 Discharge date: 01/08/2013  Time spent: 35 minutes  Recommendations for Outpatient Follow-up:  1. Please followup on BMP on hospital followup appointment. She was noted have hypokalemia for which he was given potassium replacement, with repeat potassium level at 5.3.   Discharge Diagnoses:  Active Problems:   COPD GOLD II/ III   Chronic respiratory failure   CAP (community acquired pneumonia)   Neck pain   HTN (hypertension)   Weakness   Dehydration   Protein-calorie malnutrition, severe   Discharge Condition: Stable/Improved  Diet recommendation: Heart Healthy Diet  Filed Weights   01/05/13 0405 01/06/13 0644 01/07/13 0606  Weight: 40.2 kg (88 lb 10 oz) 41.4 kg (91 lb 4.3 oz) 40 kg (88 lb 2.9 oz)    History of present illness:  CORETHA CRESWELL is a 79 y.o. female with known history of COPD presented to the ER because of shortness of breath and neck pain. Patient has been having these symptoms over the last 4 days which has been gradually worsening. Patient also has been having some productive cough. Denies any fever chills chest pain nausea vomiting or diarrhea. Denies any fall or trauma. In the ER patient had CT angiogram of the chest which was negative for PE but did show possible pneumonia. Patient was placed on nebulizer and antibiotics and has been admitted for further management.   Hospital Course:   patient is a pleasant 79 year old female with a past medical history of chronic obstructive pulmonary disease, chronic hypoxemic respiratory failure requiring home oxygen, using 2-3 L at baseline. She presented to the emergency department on 01/03/2013 complaining of increasing shortness of breath along with neck pain. Initial workup included a CTA of lungs which was negative for pulmonary embolism however did show bibasilar  opacities which could be consistent with pneumonia. There is also localized inflammatory. Interstitial accentuation and patchy groundglass opacities in the right upper lobe. She was started on empiric Levaquin therapy during this hospitalization. She was also started on oral prednisone at 60 mg by mouth daily. Physical therapy was consulted. By 01/08/2013 she ambulated greater than 160 feet with her walker. Given significant clinical improvement, she was discharged in stable condition on 01/08/2013. Prior to discharge she was set up with home health services.    Consultations:  PT  SW consulted to set patient with home health services  Discharge Exam: Filed Vitals:   01/08/13 0440  BP: 119/73  Pulse: 85  Temp: 98.3 F (36.8 C)  Resp: 20    General: No acute distress, reports feeling much better, ambulated down the hallway with her walker Cardiovascular: Regular rate and rhythm normal S1 S2 Respiratory: Lungs breath sounds bilaterally, otherwise improvement to respiratory wheezing Abdomen. Soft, NT, ND  Discharge Instructions  Discharge Orders   Future Orders Complete By Expires   Call MD for:  difficulty breathing, headache or visual disturbances  As directed    Call MD for:  persistant nausea and vomiting  As directed    Call MD for:  severe uncontrolled pain  As directed    Call MD for:  temperature >100.4  As directed    Diet - low sodium heart healthy  As directed    Increase activity slowly  As directed        Medication List         acetaminophen 500 MG tablet  Commonly  known as:  TYLENOL  Take 2 tablets (1,000 mg total) by mouth every 8 (eight) hours as needed for mild pain.     albuterol 108 (90 BASE) MCG/ACT inhaler  Commonly known as:  PROVENTIL HFA;VENTOLIN HFA  Inhale 2 puffs into the lungs every 4 (four) hours as needed.     albuterol (5 MG/ML) 0.5% nebulizer solution  Commonly known as:  PROVENTIL  Take 0.5 mLs (2.5 mg total) by nebulization every 2  (two) hours as needed for wheezing.     denosumab 60 MG/ML Soln injection  Commonly known as:  PROLIA  Inject 60 mg into the skin every 6 (six) months. Administer in upper arm, thigh, or abdomen. Dr Hoy Register office     ipratropium 0.02 % nebulizer solution  Commonly known as:  ATROVENT  Take 2.5 mLs (0.5 mg total) by nebulization 3 (three) times daily.     losartan 50 MG tablet  Commonly known as:  COZAAR  Take 25 mg by mouth daily.     NON FORMULARY  Adult Gummy multivitamin take 2 daily     predniSONE 10 MG tablet  Commonly known as:  STERAPRED UNI-PAK  Take 6 tab PO q day x 2 days, then 5 tabs PO q day x 2 days, then 4 tab PO q day x 2 days, then 3 tab PO q day x 2 says, then 2 tab PO q day x 2 days, then 1 tab PO q day x 2 days then stop     tiotropium 18 MCG inhalation capsule  Commonly known as:  SPIRIVA  Place 18 mcg into inhaler and inhale daily.     traMADol 50 MG tablet  Commonly known as:  ULTRAM  Take 50 mg by mouth every 6 (six) hours as needed for moderate pain.     vitamin D (CHOLECALCIFEROL) 400 UNITS tablet  Take 1,200 Units by mouth daily.       Allergies  Allergen Reactions  . Aspirin Nausea And Vomiting  . Codeine Hives  . Penicillins Swelling  . Shrimp [Shellfish Allergy] Swelling  . Tomato     Swelling, hives       Follow-up Information   Follow up with Sandrea Hughs, MD In 1 week.   Specialty:  Pulmonary Disease   Contact information:   [address] [place] [address] [phone]       Follow up with Eartha Inch, MD In 2 weeks.   Specialty:  Family Medicine   Contact information:   [address] [place] [address] [phone]        The results of significant diagnostics from this hospitalization (including imaging, microbiology, ancillary and laboratory) are listed below for reference.    Significant Diagnostic Studies: Dg Chest 2 View  01/03/2013   CLINICAL DATA:  Cough and shortness of breath.   EXAM: CHEST  2 VIEW  COMPARISON:  01/30/2012.  FINDINGS: Mediastinum and hilar structures are normal. Heart size normal. Pulmonary vascularity normal. Diffuse pulmonary interstitial changes noted consistent with interstitial fibrosis again noted. Bullous COPD. No significant pleural effusion or pneumothorax. Minimal infiltrate left lung base cannot be excluded. No acute bony abnormality. Old bilateral rib fractures are noted. Prior thoracic spine kyphoplasty. Diffuse thoracic spine osteopenia and degenerative change.  IMPRESSION: 1. Minimal infiltrate left lung base cannot be excluded. 2. Severe pulmonary interstitial fibrosis in COPD.   Electronically Signed   By: Maisie Fus  Register   On: 01/03/2013 14:17   Dg Cervical Spine 2  Or 3 Views  01/03/2013   CLINICAL DATA:  79 year old female with left side neck pain radiating to the shoulder. Initial encounter.  EXAM: CERVICAL SPINE - 2 VIEW  COMPARISON:  Cervical spine CT 12-13.  FINDINGS: AP and lateral view. Normal prevertebral soft tissue contour. Trace anterolisthesis of C3 on C4 and C4 on C5 is stable along with mild retrolisthesis of C5 on C6. Grossly preserved cervicothoracic junction alignment. Chronic lower cervical disc space loss. Mild right cervical carotid calcified atherosclerosis. AP cervical spine alignment within normal limits. Emphysema in the lung apices.  IMPRESSION: Stable height and alignment of cervical vertebrae. Chronic multilevel degenerative changes, as described on 01/06/2012.   Electronically Signed   By: Augusto Gamble M.D.   On: 01/03/2013 21:18   Ct Angio Chest W/cm &/or Wo Cm  01/03/2013   CLINICAL DATA:  Shortness of breath. COPD. Home oxygen. Left arm pain. Back pain with cough. Elevated D-dimer.  EXAM: CT ANGIOGRAPHY CHEST WITH CONTRAST  TECHNIQUE: Multidetector CT imaging of the chest was performed using the standard protocol during bolus administration of intravenous contrast. Multiplanar CT image reconstructions including MIPs  were obtained to evaluate the vascular anatomy.  CONTRAST:  60mL OMNIPAQUE IOHEXOL 350 MG/ML SOLN  COMPARISON:  01/03/2013  FINDINGS: No filling defect is identified in the pulmonary arterial tree to suggest pulmonary embolus. Atherosclerotic calcification of the aortic arch noted. No aortic dissection observed.  Right hilar node short axis 0.8 cm. Right infrahilar node short axis 1.0 cm. Left infrahilar node short axis is 0.7 cm.  Coronary artery atherosclerotic calcification.  Markedly severe centrilobular emphysema. Dependent airspace opacities in both lower lobes have some associated volume loss but also some airspace filling process potentially reflecting aspiration pneumonitis or lower lobe pneumonia. There is mild atelectasis in the lingula and some interstitial and patchy ground-glass opacity anteriorly in the right upper lobe.  Compression fractures observed at T10 and T12 with prior vertebral augmentations. Left rib deformities due to old fractures.  Review of the MIP images confirms the above findings.  IMPRESSION: 1. No acute pulmonary embolus or thoracic aortic dissection. 2. Bibasilar airspace opacities mixed in with volume loss in both lower lobes. Cannot exclude aspiration pneumonitis or lower lobe pneumonia. 3. Localized inflammatory appearing interstitial accentuation and patchy ground-glass opacity anteriorly in the right upper lobe. 4. Coronary artery atherosclerosis. 5. Right hilar and bilateral infrahilar lymph nodes are likely reactive. 6. Markedly severe emphysema.   Electronically Signed   By: Herbie Baltimore M.D.   On: 01/03/2013 18:58    Microbiology: Recent Results (from the past 240 hour(s))  CLOSTRIDIUM DIFFICILE BY PCR     Status: None   Collection Time    01/07/13  2:41 PM      Result Value Range Status   C difficile by pcr NEGATIVE  NEGATIVE Final     Labs: Basic Metabolic Panel:  Recent Labs Lab 01/03/13 1643 01/04/13 0645 01/06/13 0645  NA 135 138 135  K 3.0*  3.4* 5.3*  CL 93* 96 103  CO2 29 30 22   GLUCOSE 107* 86 74  BUN 16 10 4*  CREATININE 0.48* 0.49* 0.40*  CALCIUM 9.0 8.5 7.8*  MG  --  2.1  --    Liver Function Tests:  Recent Labs Lab 01/04/13 0645  AST 25  ALT 11  ALKPHOS 78  BILITOT 0.4  PROT 7.0  ALBUMIN 2.7*   No results found for this basename: LIPASE, AMYLASE,  in the last 168 hours No results  found for this basename: AMMONIA,  in the last 168 hours CBC:  Recent Labs Lab 01/03/13 1643 01/04/13 0645  WBC 12.2* 10.6*  NEUTROABS 8.6* 7.5  HGB 11.4* 11.3*  HCT 34.2* 34.0*  MCV 95.8 96.0  PLT 318 323   Cardiac Enzymes: No results found for this basename: CKTOTAL, CKMB, CKMBINDEX, TROPONINI,  in the last 168 hours BNP: BNP (last 3 results)  Recent Labs  01/13/12 1942  PROBNP 2922.0*   CBG: No results found for this basename: GLUCAP,  in the last 168 hours     Signed:  Jeralyn Bennett  [facility] 01/08/2013, 10:43 AM

## 2013-08-24 ENCOUNTER — Other Ambulatory Visit: Payer: Self-pay | Admitting: Orthopedic Surgery

## 2013-08-24 ENCOUNTER — Ambulatory Visit
Admission: RE | Admit: 2013-08-24 | Discharge: 2013-08-24 | Disposition: A | Discharge status: Home / Self Care | Payer: Medicare Other | Source: Ambulatory Visit | Attending: Orthopedic Surgery | Admitting: Orthopedic Surgery

## 2013-08-24 DIAGNOSIS — D4959 Neoplasm of unspecified behavior of other genitourinary organ: Secondary | ICD-10-CM

## 2013-08-30 ENCOUNTER — Encounter: Payer: Self-pay | Admitting: Gynecologic Oncology

## 2013-08-30 ENCOUNTER — Other Ambulatory Visit: Payer: Self-pay | Admitting: Gynecologic Oncology

## 2013-08-30 ENCOUNTER — Ambulatory Visit: Payer: Medicare Other | Attending: Gynecologic Oncology | Admitting: Gynecologic Oncology

## 2013-08-30 ENCOUNTER — Ambulatory Visit: Payer: Medicare Other

## 2013-08-30 VITALS — BP 156/66 | HR 89 | Temp 98.5°F | Resp 18 | Ht 59.0 in | Wt 80.6 lb

## 2013-08-30 DIAGNOSIS — N838 Other noninflammatory disorders of ovary, fallopian tube and broad ligament: Secondary | ICD-10-CM

## 2013-08-30 DIAGNOSIS — J449 Chronic obstructive pulmonary disease, unspecified: Secondary | ICD-10-CM | POA: Diagnosis not present

## 2013-08-30 DIAGNOSIS — Z79899 Other long term (current) drug therapy: Secondary | ICD-10-CM | POA: Diagnosis not present

## 2013-08-30 DIAGNOSIS — I10 Essential (primary) hypertension: Secondary | ICD-10-CM | POA: Diagnosis not present

## 2013-08-30 DIAGNOSIS — N83209 Unspecified ovarian cyst, unspecified side: Secondary | ICD-10-CM | POA: Insufficient documentation

## 2013-08-30 DIAGNOSIS — E785 Hyperlipidemia, unspecified: Secondary | ICD-10-CM | POA: Diagnosis not present

## 2013-08-30 DIAGNOSIS — D3910 Neoplasm of uncertain behavior of unspecified ovary: Secondary | ICD-10-CM

## 2013-08-30 DIAGNOSIS — IMO0002 Reserved for concepts with insufficient information to code with codable children: Secondary | ICD-10-CM | POA: Diagnosis not present

## 2013-08-30 DIAGNOSIS — R1900 Intra-abdominal and pelvic swelling, mass and lump, unspecified site: Secondary | ICD-10-CM

## 2013-08-30 DIAGNOSIS — M810 Age-related osteoporosis without current pathological fracture: Secondary | ICD-10-CM | POA: Diagnosis not present

## 2013-08-30 DIAGNOSIS — Z9981 Dependence on supplemental oxygen: Secondary | ICD-10-CM | POA: Diagnosis not present

## 2013-08-30 DIAGNOSIS — J4489 Other specified chronic obstructive pulmonary disease: Secondary | ICD-10-CM | POA: Insufficient documentation

## 2013-08-30 DIAGNOSIS — N839 Noninflammatory disorder of ovary, fallopian tube and broad ligament, unspecified: Secondary | ICD-10-CM

## 2013-08-30 NOTE — Progress Notes (Signed)
Consult Note: Gyn-Onc  Consult was requested by Dr. Lynann Bologna for the evaluation of DE LIBMAN 80 y.o. female  for a left ovarian and pelvic mass.  CC:  Chief Complaint  Patient presents with  . Abdominal Pain    ovarian cyst    Assessment/Plan:  Ms. ALESHA JAFFEE  is a 80 y.o.  year old woman with severe COPD who is oxygen dependent and has a new finding of a 4 cm cystic ovarian mass in addition to a 4 cm pelvic mass which is more solid in appearance on both MRI and transvaginal ultrasound.  Overall Maleeah is a poor surgical candidate, with severe COPD for which she is on home oxygen. However I do have concerns on pelvic exam regarding this mass. I am concerned that she may have 2 separate pelvic processes going on: 1 a cystic ovarian mass, which may well not be invasive malignancy. 2. A retroperitoneal pelvic mass that is fixed to the colon and pelvic sidewall. I have requested her MRI images for me to personally review `to better delineate these masses. Have also ordered a CA 125 to be drawn today. If it is normal, that is somewhat reassuring regarding the likelihood of a primary ovarian malignancy, however her a retroperitoneal process should still be ruled out and investigated.  Jyssica feels that her pain symptoms are mild from this, and feels comfortable treating them with Tylenol. She does not feel strongly that she needs a surgery to palliate her pain. She understands her major pulmonary risk with surgery and anesthesia (especially a concern for prolonged intubation or postoperative pulmonary morbidity) and only desires pursuing surgery if we have a high suspicion of malignancy that require surgical intervention.  After reviewing her CA 125 result an MRI images I will determine if additional pelvic imaging and biopsy of the mass are indicated versus proceeding with surgery. A surgery involving a robotic hysterectomy bilateral salpingo-oophorectomy, possible laparotomy, possible bowel  resection would be necessary to manage this mass. She will need to see her pulmonologist, Dr. Melvyn Novas, for pulmonary optimization prior to any surgical intervention. I discussed with Dominica that surgery in all some risk at the surgical site (such as infection, bleeding, damage to adjacent organs, reoperation, thromboembolic event) for her particularly I am concerned about pulmonary morbidity, prolonged ventilation, and postoperative ICU stay.   HPI: Ms. Pentland is a 80 year old woman who has left hip and leg radicular pain for her 6 months. Dr. Jacqulyn Bath a MRI of the lumbar spine on 08/23/2013 which showed moderate to severe facet joint hypertrophy and stable mild disc bulge at L4-5, and a partially visible 5 cm cystic lesion with mural nodularity in the left pelvis. It was new from prior evaluation in 2011. On 08/24/2013 she underwent a transvaginal ultrasound of the pelvis which revealed a normal uterus measuring 5.4 x 2.2 x 4 cm, however posterior cervical or your or uterine segment solid-appearing mass was appreciated measuring 3 cm with hyperemia. Additionally the left ovary represented a 4.1 x 3.3 x 2.5 cm assessed, and an adjacent 4.5 x 2.5 x 2.5 cm solid appearing mass. No free fluid was appreciated in the pelvis.  Ms. Bently requires home 02 after her a diagnosis of COPD, and hospitalizations for bronchitis and pneumonia. Dr. Melvyn Novas is her pulmonologist. She is cachectic and thin but reports that his weight is stable. She denies abdominal bloating early satiety or abdominal pelvic pain. She denies vaginal bleeding or abnormal discharge.  Interval History: Her left lower extremity  radicular pain continues to be managed with oral Tylenol.  Current Meds:  Outpatient Encounter Prescriptions as of 08/30/2013  Medication Sig  . acetaminophen (TYLENOL) 500 MG tablet Take 2 tablets (1,000 mg total) by mouth every 8 (eight) hours as needed for mild pain.  Marland Kitchen albuterol (PROVENTIL HFA;VENTOLIN HFA) 108 (90 BASE)  MCG/ACT inhaler Inhale 2 puffs into the lungs every 4 (four) hours as needed.  Marland Kitchen albuterol (PROVENTIL) (5 MG/ML) 0.5% nebulizer solution Take 0.5 mLs (2.5 mg total) by nebulization every 2 (two) hours as needed for wheezing.  Marland Kitchen denosumab (PROLIA) 60 MG/ML SOLN injection Inject 60 mg into the skin every 6 (six) months. Administer in upper arm, thigh, or abdomen. Dr Kathrin Penner office  . losartan (COZAAR) 50 MG tablet Take 25 mg by mouth daily.  . Multiple Vitamins-Minerals (MULTI-VITAMIN GUMMIES) CHEW Chew 2 tablets by mouth.  . tiotropium (SPIRIVA) 18 MCG inhalation capsule Place 18 mcg into inhaler and inhale daily.  . traMADol (ULTRAM) 50 MG tablet Take 50 mg by mouth every 6 (six) hours as needed for moderate pain.  . vitamin D, CHOLECALCIFEROL, 400 UNITS tablet Take 1,200 Units by mouth daily.  . [DISCONTINUED] ALBUTEROL IN Inhale 2 puffs into the lungs.  . [DISCONTINUED] ipratropium (ATROVENT) 0.02 % nebulizer solution Take 2.5 mLs (0.5 mg total) by nebulization 3 (three) times daily.  . [DISCONTINUED] NON FORMULARY Adult Gummy multivitamin take 2 daily   . [DISCONTINUED] predniSONE (STERAPRED UNI-PAK) 10 MG tablet Take 6 tab PO q day x 2 days, then 5 tabs PO q day x 2 days, then 4 tab PO q day x 2 days, then 3 tab PO q day x 2 says, then 2 tab PO q day x 2 days, then 1 tab PO q day x 2 days then stop    Allergy:  Allergies  Allergen Reactions  . Aspirin Nausea And Vomiting  . Codeine Hives  . Penicillins Swelling  . Shrimp [Shellfish Allergy] Swelling  . Tomato     Swelling, hives    Social Hx:   History   Social History  . Marital Status: Married    Spouse Name: N/A    Number of Children: N/A  . Years of Education: N/A   Occupational History  . Not on file.   Social History Main Topics  . Smoking status: Former Smoker -- 0.30 packs/day for 31 years    Types: Cigarettes    Start date: 02/04/1950    Quit date: 02/04/1981  . Smokeless tobacco: Never Used     Comment:  Married, lives with spouse. retired  . Alcohol Use: 1.2 oz/week    2 Glasses of wine per week  . Drug Use: No  . Sexual Activity: Not on file   Other Topics Concern  . Not on file   Social History Narrative  . No narrative on file    Past Surgical Hx:  Past Surgical History  Procedure Laterality Date  . Cystoscopy  07/2005  . Vertebroplasty  03/2005    Dr. Vernard Gambles    Past Medical Hx:  Past Medical History  Diagnosis Date  . DIVERTICULOSIS, COLON   . VITAMIN D DEFICIENCY   . HYPERLIPIDEMIA   . HYPERTENSION   . OSTEOPOROSIS   . COPD (chronic obstructive pulmonary disease)     PFTs 02/10/12: mild-mod    Past Gynecological History:  G4,P4 (SVD's). No prior abnormal paps, no post menopausal bleeding.  No LMP recorded. Patient is postmenopausal.  Family Hx:  Family History  Problem Relation Age of Onset  . Heart disease Mother   . Hypertension Mother   . Heart disease Father   . Hypertension Father     Review of Systems:  Constitutional  Feels well,    ENT Normal appearing ears and nares bilaterally Skin/Breast  No rash, sores, jaundice, itching, dryness Cardiovascular  No chest pain or edema  Pulmonary  + cough or wheeze. +SOB on exertion. Gastro Intestinal  No nausea, vomitting. No bright red blood per rectum, no abdominal pain, change in bowel movement, or constipation. Intermittent diarrhea since c.diff diagnosis. Genito Urinary  No frequency, urgency, dysuria, see HPI Musculo Skeletal  No myalgia, arthralgia, joint swelling or pain, Lt LE radicular pain Neurologic  No weakness, numbness, change in gait,  Psychology  No depression, anxiety, insomnia.   Vitals:  Blood pressure 156/66, pulse 89, temperature 98.5 F (36.9 C), temperature source Oral, resp. rate 18, height 4\' 11"  (1.499 m), weight 80 lb 9.6 oz (36.56 kg).  Physical Exam: WD in NAD, prominent facies - atrophy of facial muscles. Neck  Supple NROM, without any enlargements.  Lymph Node  Survey No cervical supraclavicular or inguinal adenopathy Cardiovascular  Pulse normal rate, regularity and rhythm. S1 and S2 normal.  Lungs  Clear to auscultation bilateraly,+ fine crackles. Good air movement.  Skin  No rash/lesions/breakdown  Psychiatry  Alert and oriented to person, place, and time  Abdomen  Normoactive bowel sounds, abdomen soft, non-tender and very thin without evidence of hernia. No palpable abdominal mass, no apparent ascites. Back No CVA tenderness Genito Urinary  Vulva/vagina: Normal external female genitalia.  No lesions. No discharge or bleeding.  Bladder/urethra:  No lesions or masses, well supported bladder  Vagina: no lesions  Cervix: Normal appearing, no lesions.  Uterus: The uterus itself feels small, and mobile. However, there is a firm, relatively immobile, nodular mass in the left hemipelvis that feels separate from the uterus and ovary.  Adnexa: no discrete ovarian mass appreciated though the above, nodular,firm, immobile mass (5cm) is appreciated in left hemipelvis.  Rectal  Good tone, The above mentioned mass is best appreciated on RV exam.  Extremities  No bilateral cyanosis, clubbing or edema.  [name], MD   08/30/2013, 3:46 PM

## 2013-08-31 LAB — CA 125: CA 125: 5276.8 U/mL — ABNORMAL HIGH (ref 0.0–30.2)

## 2013-09-03 ENCOUNTER — Telehealth: Payer: Self-pay | Admitting: Gynecologic Oncology

## 2013-09-03 NOTE — Telephone Encounter (Signed)
Informed patient of very elevated CA 125 (>5000) and my concern that this represents a pelvic malignancy (particularly ovarian).  I discussed that I have very limited imaging, but what I have, in addtion to my exam findngs and the presence of symptoms of left radicular pain, are concerning for an extensive infiltrating retroperitoneal process. I discussed that we will first need to better characterize the mass and what pelvic organs it is involving with an MRI (scheduled for 8/11) and likely a needle biopsy of this mass to determine the cell type. I discussed that it is unlikely that primary surgical resection will be a good plan in an 80 year old with severe COPD who is O2 dependent because the surgery is likely to be a very radical one, will likely not be curative on its own, and will incur substantial morbidity. After we have gathered more data about the extent of the mass and its pathology, we will create a list of options for therapy for Maily to consider.  [name], MD

## 2013-09-14 ENCOUNTER — Ambulatory Visit (HOSPITAL_COMMUNITY)
Admission: RE | Admit: 2013-09-14 | Discharge: 2013-09-14 | Disposition: A | Discharge status: Home / Self Care | Payer: Medicare Other | Source: Ambulatory Visit | Attending: Gynecologic Oncology | Admitting: Gynecologic Oncology

## 2013-09-14 DIAGNOSIS — N949 Unspecified condition associated with female genital organs and menstrual cycle: Secondary | ICD-10-CM | POA: Insufficient documentation

## 2013-09-14 DIAGNOSIS — N839 Noninflammatory disorder of ovary, fallopian tube and broad ligament, unspecified: Secondary | ICD-10-CM | POA: Insufficient documentation

## 2013-09-14 DIAGNOSIS — N9489 Other specified conditions associated with female genital organs and menstrual cycle: Secondary | ICD-10-CM | POA: Diagnosis not present

## 2013-09-14 DIAGNOSIS — N838 Other noninflammatory disorders of ovary, fallopian tube and broad ligament: Secondary | ICD-10-CM

## 2013-09-14 DIAGNOSIS — Z780 Asymptomatic menopausal state: Secondary | ICD-10-CM | POA: Diagnosis not present

## 2013-09-14 DIAGNOSIS — R1900 Intra-abdominal and pelvic swelling, mass and lump, unspecified site: Secondary | ICD-10-CM

## 2013-09-14 LAB — POCT I-STAT CREATININE: Creatinine, Ser: 0.6 mg/dL (ref 0.50–1.10)

## 2013-09-14 MED ORDER — GADOBENATE DIMEGLUMINE 529 MG/ML IV SOLN
10.0000 mL | Freq: Once | INTRAVENOUS | Status: AC | PRN
  Administered 2013-09-14: 7 mL via INTRAVENOUS

## 2013-09-15 ENCOUNTER — Other Ambulatory Visit: Payer: Self-pay | Admitting: Gynecologic Oncology

## 2013-09-15 DIAGNOSIS — N838 Other noninflammatory disorders of ovary, fallopian tube and broad ligament: Secondary | ICD-10-CM

## 2013-09-16 ENCOUNTER — Telehealth: Payer: Self-pay | Admitting: *Deleted

## 2013-09-16 NOTE — Telephone Encounter (Signed)
Call from pt's daughter Page who has the following concerns:  1. Why did pt's lab appt on Monday 8/17 get cancelled?  2. When will she have a needle biopsy? Discussed with Page I was unable to determine why LAB/Dr. Marko Plume appt was cancelled.  I do not have any information at this time regarding an appt for a needle biopsy.  Pt got on phone, discussed she spoke with NP yesterday who gave her MRI results and set up appt for pt to see Dr. Denman George on Monday 8/17. Discussed with pt the appt with MD may clarify some of her concerns. Pt agreed. Spoke with Page and relayed same information. No further concerns.

## 2013-09-17 ENCOUNTER — Other Ambulatory Visit: Payer: Self-pay | Admitting: Gynecologic Oncology

## 2013-09-17 DIAGNOSIS — N838 Other noninflammatory disorders of ovary, fallopian tube and broad ligament: Secondary | ICD-10-CM

## 2013-09-20 ENCOUNTER — Ambulatory Visit: Payer: Medicare Other | Admitting: Oncology

## 2013-09-20 ENCOUNTER — Other Ambulatory Visit: Payer: Medicare Other

## 2013-09-20 ENCOUNTER — Ambulatory Visit: Payer: Medicare Other

## 2013-09-20 ENCOUNTER — Encounter: Payer: Self-pay | Admitting: Gynecologic Oncology

## 2013-09-20 ENCOUNTER — Ambulatory Visit: Payer: Medicare Other | Attending: Gynecologic Oncology | Admitting: Gynecologic Oncology

## 2013-09-20 ENCOUNTER — Other Ambulatory Visit: Payer: Self-pay | Admitting: Gynecologic Oncology

## 2013-09-20 DIAGNOSIS — R05 Cough: Secondary | ICD-10-CM

## 2013-09-20 DIAGNOSIS — Z91013 Allergy to seafood: Secondary | ICD-10-CM | POA: Diagnosis not present

## 2013-09-20 DIAGNOSIS — Z9981 Dependence on supplemental oxygen: Secondary | ICD-10-CM | POA: Diagnosis not present

## 2013-09-20 DIAGNOSIS — Z880 Allergy status to penicillin: Secondary | ICD-10-CM | POA: Diagnosis not present

## 2013-09-20 DIAGNOSIS — Z886 Allergy status to analgesic agent status: Secondary | ICD-10-CM | POA: Diagnosis not present

## 2013-09-20 DIAGNOSIS — E785 Hyperlipidemia, unspecified: Secondary | ICD-10-CM | POA: Diagnosis not present

## 2013-09-20 DIAGNOSIS — N839 Noninflammatory disorder of ovary, fallopian tube and broad ligament, unspecified: Secondary | ICD-10-CM

## 2013-09-20 DIAGNOSIS — Z87891 Personal history of nicotine dependence: Secondary | ICD-10-CM | POA: Diagnosis not present

## 2013-09-20 DIAGNOSIS — K5730 Diverticulosis of large intestine without perforation or abscess without bleeding: Secondary | ICD-10-CM | POA: Diagnosis not present

## 2013-09-20 DIAGNOSIS — J449 Chronic obstructive pulmonary disease, unspecified: Secondary | ICD-10-CM | POA: Insufficient documentation

## 2013-09-20 DIAGNOSIS — Z79899 Other long term (current) drug therapy: Secondary | ICD-10-CM | POA: Insufficient documentation

## 2013-09-20 DIAGNOSIS — I10 Essential (primary) hypertension: Secondary | ICD-10-CM | POA: Diagnosis not present

## 2013-09-20 DIAGNOSIS — Z885 Allergy status to narcotic agent status: Secondary | ICD-10-CM | POA: Insufficient documentation

## 2013-09-20 DIAGNOSIS — E559 Vitamin D deficiency, unspecified: Secondary | ICD-10-CM | POA: Diagnosis not present

## 2013-09-20 DIAGNOSIS — Z91018 Allergy to other foods: Secondary | ICD-10-CM | POA: Insufficient documentation

## 2013-09-20 DIAGNOSIS — J438 Other emphysema: Secondary | ICD-10-CM

## 2013-09-20 DIAGNOSIS — N838 Other noninflammatory disorders of ovary, fallopian tube and broad ligament: Secondary | ICD-10-CM

## 2013-09-20 DIAGNOSIS — J4489 Other specified chronic obstructive pulmonary disease: Secondary | ICD-10-CM | POA: Insufficient documentation

## 2013-09-20 DIAGNOSIS — R059 Cough, unspecified: Secondary | ICD-10-CM

## 2013-09-20 NOTE — Patient Instructions (Signed)
Plan to have your CT of the chest and abdomen/pelvis on August 20 followed by your CT biopsy on Monday, August 24.  Please call for any questions or concerns.  You will be contacted with an appt date and time to see Dr. Marko Plume.

## 2013-09-20 NOTE — Progress Notes (Signed)
Progress Note: Gyn-Onc  CC:  Chief Complaint  Patient presents with  . Ovarian Mass    Assessment/Plan:  Ms. UYEN EICHHOLZ  is a 80 y.o.  year old with likely clinical stage III ovarian cancer who is seen for counseling regarding test results and plan.We spent 50 minutes in direct face to face counseling.  I discussed with the patient and her 2 daughters and husband her results. We reviewed imaging together (including viewing films).   I showed the patient and her family that she has a left ovarian mass, and associated but separate pelvic side wall masses. These extend from the posterior cul de sac to the bifurcation of the iliacs. It is compressing the sigmoid and is not clearly originating from or metastatic to the colon. The mass was appreciated by me on physical examination and is separate from the uterus and cervix and ovarian mass. It felt fixed to the pelvic side wall. Based on the appearance on imaging I am suspicious that these lesions are drop mets to the cul de sac and associated pelvic lymphadenopathy. Of note, there are no other clear metastatic foci on the MRI of the pelvis. However, the patient does report a persistent and worsening cough. This may be related to her underlying emphysema, however, I have concern about pulmonary metastases, and therefore we have ordered a chest CT.  I discussed with Ezmeralda and her family that I do not have histologic diagnosis of cancer, however, given the appearance on imaging, I believe that she has a malignancy. I do not believe she is a good surgical candidate due to her advanced age, metastatic disease which is fixed to the side wall (preventing complete resection without major morbidity, likely suboptimal resection, and certain colostomy formation) and because her O2 dependency from severe COPD places her at particular surgical morbidity and mortality risk. In addition she manifests clinical signs concerning for malnutrition associated with  cancer-associated myopathy and wasting. I discussed that surgery only serves a role in metastatic disease processes if it is followed by chemotherapy, and therefore, because I do not believe she would tolerate both, I am recommending she consider beginning with chemotherapy. I would only contemplate a secondary or interval surgery if she demonstrates substantial response to chemotherapy and surgery would involve a substantially less radical intervention. Potentially, if this tumor is confirmed to be a localized process on imaging, she might be a candidate for radiation to the left pelvis to consolidate chemotherapy response, ameliorate pain symptoms or prevent colonic obstruction.  I discussed that prior to commencing chemotherapy or radiation, we would need to establish a histologic diagnosis. This could be achieved with CT guided biopsy which would be substantially less morbid for this patient than a surgical biopsy which would require general anesthesia. We have ordered a CT of the chest, abdomen and pelvis pre-procedure so that we can evaluate the relationship of bowel loops to masses. I will proceed with scheduling an appointment with Dr Marko Plume for soon after her biopsy is obtained so that we can ensure that she is started on therapy as soon as possible.   HPI: Ms. Alix is a 80 year old woman who has left hip and leg radicular pain for her 6 months. Dr. Jacqulyn Bath a MRI of the lumbar spine on 08/23/2013 which showed moderate to severe facet joint hypertrophy and stable mild disc bulge at L4-5, and a partially visible 5 cm cystic lesion with mural nodularity in the left pelvis. It was new from prior evaluation in 2011.  On 08/24/2013 she underwent a transvaginal ultrasound of the pelvis which revealed a normal uterus measuring 5.4 x 2.2 x 4 cm, however posterior cervical or your or uterine segment solid-appearing mass was appreciated measuring 3 cm with hyperemia. Additionally the left ovary represented a 4.1  x 3.3 x 2.5 cm assessed, and an adjacent 4.5 x 2.5 x 2.5 cm solid appearing mass. No free fluid was appreciated in the pelvis.   CA 125 on 08/30/13 was very elevated at [place].  On 09/14/13 she received an MRI of the pelvis which showed: a normal appearing small (4cm) uterus and cervix.Left ovary: A complex cystic and solid mass is seen in the left  adnexa which measures approximately 5.0 x 2.4 x 3.6 cm. This contains at least 2 solid enhancing mural nodules each measuring approximately 1 cm. This is suspicious for cystic ovarian carcinoma.  Multilobular soft tissue masses are seen along the left pelvic sidewall, at and below the level of the iliac bifurcation, and in the left pelvic cul-de-sac posterior to the lower uterine  segment. These masses measure approximately 4.7 x 6.1 cm and 2.7 x 6.9 cm respectively. These masses appear to involve the sigmoid colon in this region, but do not definitely arise from the sigmoid colon. This favors pelvic metastatic disease over primary sigmoid colon carcinoma. No evidence of bowel obstruction.  Ms. Furnari requires home 02 after her a diagnosis of COPD, and hospitalizations for bronchitis and pneumonia. Dr. Melvyn Novas is her pulmonologist. She is cachectic and thin but reports that his weight is stable. She denies abdominal bloating early satiety or abdominal pelvic pain. She denies vaginal bleeding or abnormal discharge.    Interval History: she feels she is gaining weight overall. She has only intermittent mild pain which is alleviated by tramadol approximately twice per week.  Current Meds:  Outpatient Encounter Prescriptions as of 09/20/2013  Medication Sig  . acetaminophen (TYLENOL) 500 MG tablet Take 2 tablets (1,000 mg total) by mouth every 8 (eight) hours as needed for mild pain.  Marland Kitchen albuterol (PROVENTIL HFA;VENTOLIN HFA) 108 (90 BASE) MCG/ACT inhaler Inhale 2 puffs into the lungs every 4 (four) hours as needed.  Marland Kitchen albuterol (PROVENTIL) (5 MG/ML) 0.5%  nebulizer solution Take 0.5 mLs (2.5 mg total) by nebulization every 2 (two) hours as needed for wheezing.  Marland Kitchen denosumab (PROLIA) 60 MG/ML SOLN injection Inject 60 mg into the skin every 6 (six) months. Administer in upper arm, thigh, or abdomen. Dr Kathrin Penner office  . losartan (COZAAR) 50 MG tablet Take 25 mg by mouth daily.  . Multiple Vitamins-Minerals (MULTI-VITAMIN GUMMIES) CHEW Chew 2 tablets by mouth.  . tiotropium (SPIRIVA) 18 MCG inhalation capsule Place 18 mcg into inhaler and inhale daily.  . traMADol (ULTRAM) 50 MG tablet Take 50 mg by mouth every 6 (six) hours as needed for moderate pain.  . vitamin D, CHOLECALCIFEROL, 400 UNITS tablet Take 1,200 Units by mouth daily.    Allergy:  Allergies  Allergen Reactions  . Aspirin Nausea And Vomiting  . Codeine Hives  . Penicillins Swelling  . Shrimp [Shellfish Allergy] Swelling  . Tomato     Swelling, hives    Social Hx:   History   Social History  . Marital Status: Married    Spouse Name: N/A    Number of Children: N/A  . Years of Education: N/A   Occupational History  . Not on file.   Social History Main Topics  . Smoking status: Former Smoker -- 0.30 packs/day for 31  years    Types: Cigarettes    Start date: 02/04/1950    Quit date: 02/04/1981  . Smokeless tobacco: Never Used     Comment: Married, lives with spouse. retired  . Alcohol Use: 1.2 oz/week    2 Glasses of wine per week  . Drug Use: No  . Sexual Activity: Not on file   Other Topics Concern  . Not on file   Social History Narrative  . No narrative on file    Past Surgical Hx:  Past Surgical History  Procedure Laterality Date  . Cystoscopy  07/2005  . Vertebroplasty  03/2005    Dr. Vernard Gambles    Past Medical Hx:  Past Medical History  Diagnosis Date  . DIVERTICULOSIS, COLON   . VITAMIN D DEFICIENCY   . HYPERLIPIDEMIA   . HYPERTENSION   . OSTEOPOROSIS   . COPD (chronic obstructive pulmonary disease)     PFTs 02/10/12: mild-mod    Past  Gynecological History:  postmenopausal No LMP recorded. Patient is postmenopausal.  Family Hx:  Family History  Problem Relation Age of Onset  . Heart disease Mother   . Hypertension Mother   . Heart disease Father   . Hypertension Father     Review of Systems:  Constitutional  Feels well,  See hpi  ENT Normal appearing ears and nares bilaterally Skin/Breast  No rash, sores, jaundice, itching, dryness Cardiovascular  No chest pain, shortness of breath, or edema  Pulmonary  No cough or wheeze.  Gastro Intestinal  No nausea, vomitting, + diarrhoea. No bright red blood per rectum, no abdominal pain, change in bowel movement, or constipation.  Genito Urinary  No frequency, urgency, dysuria, see HPI Musculo Skeletal  No myalgia, arthralgia, joint swelling or pain  Neurologic  No weakness, numbness, change in gait,  Psychology  No depression, anxiety, insomnia.   Vitals:  Weight 81.2 pounds, height 4 foot 11 inches, temperature 99.1, respiratory rate 24, blood pressure 149/77, pulse 93.  Physical Exam: Deferred.   [name], MD   09/20/2013, 3:00 PM

## 2013-09-21 ENCOUNTER — Telehealth: Payer: Self-pay | Admitting: Oncology

## 2013-09-21 ENCOUNTER — Other Ambulatory Visit: Payer: Self-pay | Admitting: Radiology

## 2013-09-21 NOTE — Telephone Encounter (Signed)
S/W PATIENT DTR AND GAVE NP APPT FOR 08/21 @ 9 CHEMO EDU, 08/28 @ 10:30 DR. LIVESAY.

## 2013-09-21 NOTE — Telephone Encounter (Signed)
C/D 09/21/13 for appt. 10/01/13

## 2013-09-23 ENCOUNTER — Encounter (HOSPITAL_COMMUNITY): Payer: Self-pay

## 2013-09-23 ENCOUNTER — Encounter (HOSPITAL_COMMUNITY): Payer: Self-pay | Admitting: Pharmacy Technician

## 2013-09-23 ENCOUNTER — Ambulatory Visit (HOSPITAL_COMMUNITY)
Admission: RE | Admit: 2013-09-23 | Discharge: 2013-09-23 | Disposition: A | Discharge status: Home / Self Care | Payer: Medicare Other | Source: Ambulatory Visit | Attending: Gynecologic Oncology | Admitting: Gynecologic Oncology

## 2013-09-23 DIAGNOSIS — J438 Other emphysema: Secondary | ICD-10-CM | POA: Diagnosis present

## 2013-09-23 DIAGNOSIS — K7689 Other specified diseases of liver: Secondary | ICD-10-CM | POA: Insufficient documentation

## 2013-09-23 DIAGNOSIS — N859 Noninflammatory disorder of uterus, unspecified: Secondary | ICD-10-CM | POA: Diagnosis not present

## 2013-09-23 DIAGNOSIS — N83209 Unspecified ovarian cyst, unspecified side: Secondary | ICD-10-CM | POA: Insufficient documentation

## 2013-09-23 DIAGNOSIS — N838 Other noninflammatory disorders of ovary, fallopian tube and broad ligament: Secondary | ICD-10-CM

## 2013-09-23 DIAGNOSIS — N839 Noninflammatory disorder of ovary, fallopian tube and broad ligament, unspecified: Secondary | ICD-10-CM | POA: Diagnosis present

## 2013-09-23 HISTORY — DX: Malignant (primary) neoplasm, unspecified: C80.1

## 2013-09-23 MED ORDER — IOHEXOL 300 MG/ML  SOLN
80.0000 mL | Freq: Once | INTRAMUSCULAR | Status: AC | PRN
  Administered 2013-09-23: 80 mL via INTRAVENOUS

## 2013-09-24 ENCOUNTER — Other Ambulatory Visit: Payer: Medicare Other

## 2013-09-24 ENCOUNTER — Other Ambulatory Visit: Payer: Self-pay | Admitting: Radiology

## 2013-09-26 ENCOUNTER — Other Ambulatory Visit: Payer: Self-pay | Admitting: Oncology

## 2013-09-26 DIAGNOSIS — K6389 Other specified diseases of intestine: Secondary | ICD-10-CM

## 2013-09-26 DIAGNOSIS — C562 Malignant neoplasm of left ovary: Secondary | ICD-10-CM

## 2013-09-27 ENCOUNTER — Ambulatory Visit (HOSPITAL_COMMUNITY)
Admission: RE | Admit: 2013-09-27 | Discharge: 2013-09-27 | Disposition: A | Discharge status: Home / Self Care | Payer: Medicare Other | Source: Ambulatory Visit | Attending: Gynecologic Oncology | Admitting: Gynecologic Oncology

## 2013-09-27 ENCOUNTER — Encounter (HOSPITAL_COMMUNITY): Payer: Self-pay

## 2013-09-27 DIAGNOSIS — N838 Other noninflammatory disorders of ovary, fallopian tube and broad ligament: Secondary | ICD-10-CM

## 2013-09-27 DIAGNOSIS — N839 Noninflammatory disorder of ovary, fallopian tube and broad ligament, unspecified: Secondary | ICD-10-CM | POA: Diagnosis present

## 2013-09-27 LAB — CBC
HCT: 36.5 % (ref 36.0–46.0)
Hemoglobin: 11.9 g/dL — ABNORMAL LOW (ref 12.0–15.0)
MCH: 30.8 pg (ref 26.0–34.0)
MCHC: 32.6 g/dL (ref 30.0–36.0)
MCV: 94.6 fL (ref 78.0–100.0)
PLATELETS: 310 10*3/uL (ref 150–400)
RBC: 3.86 MIL/uL — ABNORMAL LOW (ref 3.87–5.11)
RDW: 13.1 % (ref 11.5–15.5)
WBC: 6.9 10*3/uL (ref 4.0–10.5)

## 2013-09-27 LAB — PROTIME-INR
INR: 1.01 (ref 0.00–1.49)
Prothrombin Time: 13.3 seconds (ref 11.6–15.2)

## 2013-09-27 LAB — APTT: aPTT: 34 seconds (ref 24–37)

## 2013-09-27 MED ORDER — FENTANYL CITRATE 0.05 MG/ML IJ SOLN
INTRAMUSCULAR | Status: AC | PRN
  Administered 2013-09-27 (×4): 25 ug via INTRAVENOUS

## 2013-09-27 MED ORDER — SODIUM CHLORIDE 0.9 % IV SOLN
INTRAVENOUS | Status: DC
  Administered 2013-09-27: 12:00:00 via INTRAVENOUS

## 2013-09-27 MED ORDER — MIDAZOLAM HCL 2 MG/2ML IJ SOLN
INTRAMUSCULAR | Status: AC | PRN
  Administered 2013-09-27 (×4): 0.5 mg via INTRAVENOUS

## 2013-09-27 MED ORDER — MIDAZOLAM HCL 2 MG/2ML IJ SOLN
INTRAMUSCULAR | Status: AC
  Filled 2013-09-27: qty 6

## 2013-09-27 MED ORDER — FENTANYL CITRATE 0.05 MG/ML IJ SOLN
INTRAMUSCULAR | Status: AC
  Filled 2013-09-27: qty 6

## 2013-09-27 NOTE — H&P (Signed)
Bianca [name] is an 80 y.o. female.   Chief Complaint:  Pt with L hip/radicular and back pain x 6 months Work up reveals Ovarian mass on [place] and MRI--probable Ovarian cancer CT 09/23/13 confirms findings along with pelvic mass Scheduled for pelvic mass biopsy  HPI: HLD; HTN; COPD  Past Medical History  Diagnosis Date  . DIVERTICULOSIS, COLON   . VITAMIN D DEFICIENCY   . HYPERLIPIDEMIA   . HYPERTENSION   . OSTEOPOROSIS   . COPD (chronic obstructive pulmonary disease)     PFTs 02/10/12: mild-mod  . Cancer     ovarian ca    Past Surgical History  Procedure Laterality Date  . Cystoscopy  07/2005  . Vertebroplasty  03/2005    Dr. Vernard Gambles    Family History  Problem Relation Age of Onset  . Heart disease Mother   . Hypertension Mother   . Heart disease Father   . Hypertension Father    Social History:  reports that she quit smoking about 32 years ago. Her smoking use included Cigarettes. She started smoking about 63 years ago. She has a 9.3 pack-year smoking history. She has never used smokeless tobacco. She reports that she drinks about 1.2 ounces of alcohol per week. She reports that she does not use illicit drugs.  Allergies:  Allergies  Allergen Reactions  . Aspirin Nausea And Vomiting  . Codeine Hives  . Penicillins Swelling  . Shrimp [Shellfish Allergy] Swelling  . Tomato     Swelling, hives     (Not in a hospital admission)  Results for orders placed during the hospital encounter of 09/27/13 (from the past 48 hour(s))  APTT     Status: None   Collection Time    09/27/13 11:40 AM      Result Value Ref Range   aPTT 34  24 - 37 seconds  CBC     Status: Abnormal   Collection Time    09/27/13 11:40 AM      Result Value Ref Range   WBC 6.9  4.0 - 10.5 K/uL   RBC 3.86 (*) 3.87 - 5.11 MIL/uL   Hemoglobin 11.9 (*) 12.0 - 15.0 g/dL   HCT 36.5  36.0 - 46.0 %   MCV 94.6  78.0 - 100.0 fL   MCH 30.8  26.0 - 34.0 pg   MCHC 32.6  30.0 - 36.0 g/dL   RDW 13.1  11.5 -  15.5 %   Platelets 310  150 - 400 K/uL  PROTIME-INR     Status: None   Collection Time    09/27/13 11:40 AM      Result Value Ref Range   Prothrombin Time 13.3  11.6 - 15.2 seconds   INR 1.01  0.00 - 1.49   No results found.  Review of Systems  Constitutional: Positive for weight loss and malaise/fatigue. Negative for fever.  Respiratory: Negative for shortness of breath.   Cardiovascular: Negative for chest pain.  Gastrointestinal: Negative for nausea, vomiting and abdominal pain.  Musculoskeletal: Positive for back pain and joint pain.  Neurological: Positive for weakness. Negative for dizziness and headaches.  Psychiatric/Behavioral: Negative for substance abuse.    Blood pressure 114/65, pulse 82, temperature 98.6 F (37 C), temperature source Oral, resp. rate 18, SpO2 93.00%. Physical Exam  Constitutional: She is oriented to person, place, and time.  Thin/frail  Cardiovascular: Normal rate and regular rhythm.   No murmur heard. Respiratory: Effort normal and breath sounds normal. She has no wheezes.  GI: Soft. Bowel sounds are normal.  Musculoskeletal: Normal range of motion.  Back and L hip pain  Neurological: She is alert and oriented to person, place, and time.  Skin: Skin is warm and dry.  Psychiatric: She has a normal mood and affect. Her behavior is normal. Judgment and thought content normal.     Assessment/Plan Prob Ov Ca--new dx Ovarian mass and pelvic mass on MRI/CT Now scheduled for pelvic mass bx Pt and family aware of procedure benefits and risks and agreeable to proceed Consent signed and in chart  Velena Keegan A 09/27/2013, 12:26 PM

## 2013-09-27 NOTE — Discharge Instructions (Signed)
Needle Biopsy °Care After °These instructions give you information on caring for yourself after your procedure. Your doctor may also give you more specific instructions. Call your doctor if you have any problems or questions after your procedure. °HOME CARE °· Rest for 4 hours after your biopsy, except for getting up to go to the bathroom or as told. °· Keep the places where the needles were put in clean and dry. °· Do not put powder or lotion on the sites. °· Do not shower until 24 hours after the test. Remove all bandages (dressings) before showering. °· Remove all bandages at least once every day. Gently clean the sites with soap and water. Keep putting a new bandage on until the skin is closed. °Finding out the results of your test °Ask your doctor when your test results will be ready. Make sure you follow up and get the test results. °GET HELP RIGHT AWAY IF:  °· You have shortness of breath or trouble breathing. °· You have pain or cramping in your belly (abdomen). °· You feel sick to your stomach (nauseous) or throw up (vomit). °· Any of the places where the needles were put in: °¨ Are puffy (swollen) or red. °¨ Are sore or hot to the touch. °¨ Are draining yellowish-white fluid (pus). °¨ Are bleeding after 10 minutes of pressing down on the site. Have someone keep pressing on any place that is bleeding until you see a doctor. °· You have any unusual pain that will not stop. °· You have a fever. °If you go to the emergency room, tell the nurse that you had a biopsy. Take this paper with you to show the nurse. °MAKE SURE YOU:  °· Understand these instructions. °· Will watch your condition. °· Will get help right away if you are not doing well or get worse. °Document Released: 01/04/2008 Document Revised: 04/15/2011 Document Reviewed: 01/04/2008 °[employer]® Patient Information ©2015 [employer]. This information is not intended to replace advice given to you by your health care provider. Make sure you discuss  any questions you have with your health care provider. °Conscious Sedation, Adult, Care After °Refer to this sheet in the next few weeks. These instructions provide you with information on caring for yourself after your procedure. Your health care provider may also give you more specific instructions. Your treatment has been planned according to current medical practices, but problems sometimes occur. Call your health care provider if you have any problems or questions after your procedure. °WHAT TO EXPECT AFTER THE PROCEDURE  °After your procedure: °· You may feel sleepy, clumsy, and have poor balance for several hours. °· Vomiting may occur if you eat too soon after the procedure. °HOME CARE INSTRUCTIONS °· Do not participate in any activities where you could become injured for at least 24 hours. Do not: °¨ Drive. °¨ Swim. °¨ Ride a bicycle. °¨ Operate heavy machinery. °¨ Cook. °¨ Use power tools. °¨ Climb ladders. °¨ Work from a high place. °· Do not make important decisions or sign legal documents until you are improved. °· If you vomit, drink water, juice, or soup when you can drink without vomiting. Make sure you have little or no nausea before eating solid foods. °· Only take over-the-counter or prescription medicines for pain, discomfort, or fever as directed by your health care provider. °· Make sure you and your family fully understand everything about the medicines given to you, including what side effects may occur. °· You should not drink alcohol,   take sleeping pills, or take medicines that cause drowsiness for at least 24 hours. °· If you smoke, do not smoke without supervision. °· If you are feeling better, you may resume normal activities 24 hours after you were sedated. °· Keep all appointments with your health care provider. °SEEK MEDICAL CARE IF: °· Your skin is pale or bluish in color. °· You continue to feel nauseous or vomit. °· Your pain is getting worse and is not helped by medicine. °· You  have bleeding or swelling. °· You are still sleepy or feeling clumsy after 24 hours. °SEEK IMMEDIATE MEDICAL CARE IF: °· You develop a rash. °· You have difficulty breathing. °· You develop any type of allergic problem. °· You have a fever. °MAKE SURE YOU: °· Understand these instructions. °· Will watch your condition. °· Will get help right away if you are not doing well or get worse. °Document Released: 11/11/2012 Document Reviewed: 11/11/2012 °[employer]® Patient Information ©2015 [employer]. This information is not intended to replace advice given to you by your health care provider. Make sure you discuss any questions you have with your health care provider. ° °

## 2013-09-27 NOTE — Procedures (Signed)
Interventional Radiology Procedure Note  Procedure: CT biopsy of left pelvic sidewall mass. Complications: none immediate Recommendations: - Bedrest x 2 hrs  Signed,  Criselda Peaches, MD [facility]

## 2013-10-01 ENCOUNTER — Encounter: Payer: Self-pay | Admitting: Oncology

## 2013-10-01 ENCOUNTER — Other Ambulatory Visit: Payer: Self-pay | Admitting: *Deleted

## 2013-10-01 ENCOUNTER — Telehealth: Payer: Self-pay | Admitting: *Deleted

## 2013-10-01 ENCOUNTER — Ambulatory Visit: Payer: Medicare Other

## 2013-10-01 ENCOUNTER — Other Ambulatory Visit (HOSPITAL_BASED_OUTPATIENT_CLINIC_OR_DEPARTMENT_OTHER): Payer: Medicare Other

## 2013-10-01 ENCOUNTER — Ambulatory Visit (HOSPITAL_BASED_OUTPATIENT_CLINIC_OR_DEPARTMENT_OTHER): Payer: Medicare Other | Admitting: Oncology

## 2013-10-01 VITALS — BP 136/70 | HR 90 | Temp 97.7°F | Resp 18 | Ht 59.0 in | Wt 83.5 lb

## 2013-10-01 DIAGNOSIS — J438 Other emphysema: Secondary | ICD-10-CM

## 2013-10-01 DIAGNOSIS — J449 Chronic obstructive pulmonary disease, unspecified: Secondary | ICD-10-CM

## 2013-10-01 DIAGNOSIS — M810 Age-related osteoporosis without current pathological fracture: Secondary | ICD-10-CM

## 2013-10-01 DIAGNOSIS — R197 Diarrhea, unspecified: Secondary | ICD-10-CM

## 2013-10-01 DIAGNOSIS — C562 Malignant neoplasm of left ovary: Secondary | ICD-10-CM

## 2013-10-01 DIAGNOSIS — C801 Malignant (primary) neoplasm, unspecified: Secondary | ICD-10-CM

## 2013-10-01 DIAGNOSIS — N838 Other noninflammatory disorders of ovary, fallopian tube and broad ligament: Secondary | ICD-10-CM

## 2013-10-01 DIAGNOSIS — R634 Abnormal weight loss: Secondary | ICD-10-CM

## 2013-10-01 DIAGNOSIS — Z87891 Personal history of nicotine dependence: Secondary | ICD-10-CM

## 2013-10-01 LAB — COMPREHENSIVE METABOLIC PANEL (CC13)
ALBUMIN: 4 g/dL (ref 3.5–5.0)
ALK PHOS: 64 U/L (ref 40–150)
ALT: 11 U/L (ref 0–55)
AST: 30 U/L (ref 5–34)
Anion Gap: 8 mEq/L (ref 3–11)
BUN: 13.7 mg/dL (ref 7.0–26.0)
CO2: 29 meq/L (ref 22–29)
Calcium: 9.5 mg/dL (ref 8.4–10.4)
Chloride: 98 mEq/L (ref 98–109)
Creatinine: 0.6 mg/dL (ref 0.6–1.1)
GLUCOSE: 117 mg/dL (ref 70–140)
POTASSIUM: 3.7 meq/L (ref 3.5–5.1)
Sodium: 136 mEq/L (ref 136–145)
Total Bilirubin: 0.5 mg/dL (ref 0.20–1.20)
Total Protein: 8.6 g/dL — ABNORMAL HIGH (ref 6.4–8.3)

## 2013-10-01 LAB — CBC WITH DIFFERENTIAL/PLATELET
BASO%: 0.5 % (ref 0.0–2.0)
Basophils Absolute: 0 10*3/uL (ref 0.0–0.1)
EOS%: 4.2 % (ref 0.0–7.0)
Eosinophils Absolute: 0.3 10*3/uL (ref 0.0–0.5)
HCT: 39.2 % (ref 34.8–46.6)
HGB: 12.5 g/dL (ref 11.6–15.9)
LYMPH%: 16.5 % (ref 14.0–49.7)
MCH: 31 pg (ref 25.1–34.0)
MCHC: 31.9 g/dL (ref 31.5–36.0)
MCV: 97.3 fL (ref 79.5–101.0)
MONO#: 0.6 10*3/uL (ref 0.1–0.9)
MONO%: 8.6 % (ref 0.0–14.0)
NEUT%: 70.2 % (ref 38.4–76.8)
NEUTROS ABS: 4.5 10*3/uL (ref 1.5–6.5)
Platelets: 244 10*3/uL (ref 145–400)
RBC: 4.03 10*6/uL (ref 3.70–5.45)
RDW: 13.4 % (ref 11.2–14.5)
WBC: 6.4 10*3/uL (ref 3.9–10.3)
lymph#: 1.1 10*3/uL (ref 0.9–3.3)

## 2013-10-01 MED ORDER — ONDANSETRON HCL 8 MG PO TABS: ORAL_TABLET | ORAL | Status: DC

## 2013-10-01 MED ORDER — ONDANSETRON HCL 8 MG PO TABS: 8.0000 mg | ORAL_TABLET | Freq: Three times a day (TID) | ORAL | Status: DC | PRN

## 2013-10-01 MED ORDER — LORAZEPAM 0.5 MG PO TABS: ORAL_TABLET | ORAL | Status: DC

## 2013-10-01 NOTE — Progress Notes (Signed)
Checked in new pateint with no financial issues prior to seeing the dr. She has prim/secondary ins. She has appt card.

## 2013-10-01 NOTE — Telephone Encounter (Signed)
Per staff message and POF I have scheduled appts. Advised desk RN  of appts. JMW

## 2013-10-01 NOTE — Progress Notes (Signed)
[facility] NEW PATIENT EVALUATION   Name: EVALIN SHAWHAN Date: 10/01/2013 MRN: [id number] DOB: 1930-12-24  REFERRING PHYSICIAN: Everitt Amber CC: Almedia Balls, Christinia Gully, Wandra Feinstein, V.Leschber, M.Magod,_ Irena Reichmann ([place] [phone])   REASON FOR REFERRAL: new diagnosis gyn adenocarcinoma, for consideration of chemotherapy   Dr Denman George and I have communicated directly regarding this visit and treatment plans.  HISTORY OF PRESENT ILLNESS:Pailynn HORACE WISHON is a 80 y.o. female who is seen in consultation, together with her husband and one daughter, at the request of Dr Denman George, for consideration of chemotherapy for advanced, poorly differentiated adenocarcinoma apparently gyn origin.  Situation is complicated by oxygen dependent COPD and frailty, including weight 83 lbs.She has had several hospitalizations in past ~ 2 years with pneumonia/ pulmonary problems and C diff colitis. Most recent hospitalization in late 2014 was for pulmonary indications, with C diff negative 01-07-2013. It is not clear from patient/ family who her PCP is, tho she has been seen at [facility] in [place] and [facility]. She is known to Dr Melvyn Novas and was seen by [facility] cardiology during past hospitalization.  Patient was seen by Dr Lynann Bologna for radicular left lower extremity pain, which had been ongoing for several months. MRI LS 08-23-2013 reportedly showed pelvic mass. She had pelvic US in [facility] system by Dr Lynann Bologna on 08-24-13, with left adnexal mass cystic and solid, with solid area up to 4.5 cm and adjacent cyst up to 4.1 cm and additionally a solid appearing lesion along posterior cervix or lower uterine segment, recognizable ovaries not seen bilaterally and endometrial stripe 2 mm. She saw Dr Denman George in consultation on MRI pelvis was obtained 09-14-13 to better delineate process, with 5 cm complex cystic and solid mass in left adnexa, multilobular soft tissue  masses along left pelvic sidewall, involving sigmoid colon tho do not appear to arise from colon, radiographically favored to be pelvic metastatic disease over primary sigmoid colon disease. She had CT CAP on 09-23-13 for staging, with extensive emphysema, single nodule along inferior medial margin of liver and disease in pelvis. She had CT biopsy of soft tissue mass in left aspect of pelvic cul-de-sac on 09-27-13, with pathology ([id number]) showing poorly differentiated adenocarcinoma, IHC most consistent with gyn primary. Dr Denman George does not feel that she could tolerate surgery + chemotherapy, or that surgery would be optimal debulking, so has recommended that treatment begin with chemotherapy. It is doubtful that she will be a surgical candidate in future, but this could be considered later depending on response to chemotherapy and overall condition.     REVIEW OF SYSTEMS: Pain is LLE from hip down leg, intermittently, worse when stands. This has been present for a number of months and seems stable. She denies abdominal or pelvic pain or distension. She has had no bleeding. She has intermittent diarrhea, as she has had since extended bout of C diff, controlled with prn imodium. 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 Voiding ok. No swelling LE. No HA, good visual acuity with glasses, no known dental problems and does have dentist. Denies difficulty hearing. No environmental allergies. No hx thyroid problems. No chest pain or palpitations. No noted changes in breasts. No GERD. No N/V. Arthritis "in back". Fingers cold and painful at times, not now. Eats small amounts 6x daily (24 hour diet review [place], avocado, cookie, cottage cheese, 1/2 muscle milk). Weight was down to  60 lbs with C diff, gained back to present 83 and has been stable at this weight for a number of months; weight prior to C diff reportedly ~ 100 lbs. Is out of bed all day, uses  continuous O2 at home.  Remainder of full 10 point review of systems negative.   ALLERGIES: Aspirin; Codeine; Penicillins; Shrimp; and Tomato  PAST MEDICAL/ SURGICAL HISTORY:    Cystoscopy 2007 vertebroplasty by IR 2007 Colonic diverticulosis Patient denies HTN tho listed in EMR, elevated lipids Echocardiogram 01-2012 LVEF 35-40% with wall motion abnormalities Osteoporosis, on Prolia q 6 mo due again in Sept COPD, mild-moderate by PFTs Jan 2014. On home O2 since Dec 2013, 2 liters continuous G4P4 No pelvic exam/ PAP x years prior to this illness Colonoscopy by Dr Watt Climes 07-2007 normal Mammograms [phone]  [facility] with follow [facility] right diagnostic not remarkable Pulmonary rehab last fall She had flu shot already this month. Patient not certain that she has had pneumovax; denies shingles vaccine. NOTE CareEverywhere lists pneumovax 1999 and zoster vaccine 1994 Vit D deficiency per EMR  CURRENT MEDICATIONS: reviewed as listed now in EMR. Uses tramadol x a few years, "makes me dizzy but helps back pain". Calcium/ D not listed, tho she should have these with Prolia. WIll send prescriptions for zofran, which likely will be most helpful prn antiemetic, and for low dose ativan to use SL or po for chemo nausea not controlled with zofran. Daughter will assist with these medications  PHARMACY [facility] [address]   SOCIAL HISTORY: Married x 60 years yesterday. From [place], in [place] college. Husband is retired Neurosurgeon. 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 Daughter and another child local, son attorney in [place] and daughter in [place], [place], 3 g-grands.Smoked x30 years, none x 30 years. Advance Directives in place.   FAMILY HISTORY:  Cardiac disease in parents No other cancer known        PHYSICAL EXAM:  height is '4\' 11"'  (1.499 m) and weight is 83 lb 8 oz (37.875 kg). Her oral temperature is 97.7 F (36.5  C). Her blood pressure is 136/70 and her pulse is 90. Her respiration is 18 and oxygen saturation is 91%.  Elderly, frail appearing lady, alert, pleasant, cooperative. Somewhat vague historian, daughter assists. Wearing portable [place] O2, respirations not labored with this. Ambulatory slowly, needs some assistance on and off exam table.  HEENT: Normal hair pattern. PERRL, not icteric. Oral mucosa moist and clear, posterior pharynx likewise. Neck supple without JVD or obvious thyroid mass.  RESPIRATORY: Diminished BS thruout, no wheezes or rales, no use of accessory muscles. No dullness to percussion. Occasional cough  CARDIAC/ VASCULAR: heart RRR no gallop. Peripheral pulses symmetrical  ABDOMEN: soft, not tender, no appreciable HSM. Bowel sounds normal. Not obviously distended  LYMPH NODES: no cervical, supraclavicular, axillary or inguinal adenopathy  BREASTS: atrophic bilaterally without dominant mass, skin or nipple findings of concern  NEUROLOGIC: speech fluent, occasional word finding difficulty. Moves all extremities equally. CN appear intact.  SKIN: no rash, ecchymoses, petechiae  MUSCULOSKELETAL: very little muscle mass. Spine not tender. No swelling extremities, no cords or tenderness. Fingernails with slight clubbing, not extremely pale or cold.    LABORATORY DATA:  Results for orders placed in visit on 10/01/13 (from the past 48 hour(s))  CBC WITH DIFFERENTIAL     Status: None   Collection Time    10/01/13 10:40 AM      Result Value  Ref Range   WBC 6.4  3.9 - 10.3 10e3/uL   NEUT# 4.5  1.5 - 6.5 10e3/uL   HGB 12.5  11.6 - 15.9 g/dL   HCT 39.2  34.8 - 46.6 %   Platelets 244  145 - 400 10e3/uL   MCV 97.3  79.5 - 101.0 fL   MCH 31.0  25.1 - 34.0 pg   MCHC 31.9  31.5 - 36.0 g/dL   RBC 4.03  3.70 - 5.45 10e6/uL   RDW 13.4  11.2 - 14.5 %   lymph# 1.1  0.9 - 3.3 10e3/uL   MONO# 0.6  0.1 - 0.9 10e3/uL   Eosinophils Absolute 0.3  0.0 - 0.5 10e3/uL   Basophils Absolute 0.0   0.0 - 0.1 10e3/uL   NEUT% 70.2  38.4 - 76.8 %   LYMPH% 16.5  14.0 - 49.7 %   MONO% 8.6  0.0 - 14.0 %   EOS% 4.2  0.0 - 7.0 %   BASO% 0.5  0.0 - 2.0 %  COMPREHENSIVE METABOLIC PANEL (XB26)     Status: Abnormal   Collection Time    10/01/13 10:40 AM      Result Value Ref Range   Sodium 136  136 - 145 mEq/L   Potassium 3.7  3.5 - 5.1 mEq/L   Chloride 98  98 - 109 mEq/L   CO2 29  22 - 29 mEq/L   Glucose 117  70 - 140 mg/dl   BUN 13.7  7.0 - 26.0 mg/dL   Creatinine 0.6  0.6 - 1.1 mg/dL   Total Bilirubin 0.50  0.20 - 1.20 mg/dL   Alkaline Phosphatase 64  40 - 150 U/L   AST 30  5 - 34 U/L   ALT 11  0 - 55 U/L   Total Protein 8.6 (*) 6.4 - 8.3 g/dL   Albumin 4.0  3.5 - 5.0 g/dL   Calcium 9.5  8.4 - 10.4 mg/dL   Anion Gap 8  3 - 11 mEq/L        RADIOGRAPHY: TRANSABDOMINAL AND TRANSVAGINAL ULTRASOUND OF PELVIS 08-24-13 TECHNIQUE:  Both transabdominal and transvaginal ultrasound examinations of the  pelvis were performed. Transabdominal technique was performed for  global imaging of the pelvis including uterus, ovaries, adnexal  regions, and pelvic cul-de-sac. It was necessary to proceed with  endovaginal exam following the transabdominal exam to visualize the  endometrium and ovaries.  COMPARISON: 12/19/2009  FINDINGS:  Uterus  Measurements: 5.4 x 2.2 x 4.0 cm. Posterior cervical or lower  uterine segment solid-appearing mass 2.8 x 2.9 cm with hyperemia.  Faint marginal calcifications in the uterus are suggested and may be  vascular.  Endometrium  Thickness: 2 mm. No focal abnormality visualized.  Right ovary  Not seen either transabdominally or transvaginally.  Left ovary  Recognizable ovarian tissue not well seen. Along the left adnexa  there is a 4.1 x 3.3 x 3.5 cm cyst and an adjacent 4.5 x 2.5 x 2.5  cm solid-appearing mass.  Other findings  No free fluid.  IMPRESSION:  1. Suspected left ovarian mass/malignancy measuring up to 4.5 cm in  its solid portion and 4.1 cm  and an adjacent cystic portion. There  is also a solid appearing lesion along the posterior cervix or lower  uterine segment. The right ovary is not well seen, and recognizable  left ovarian tissue is not readily apparent. If further delineation  of structures prior to resection is warranted, ovarian protocol MRI  with and  without contrast could be utilized for further  characterization.    MRI PELVIS WITHOUT AND WITH CONTRAST 09-14-13 TECHNIQUE:  Multiplanar multisequence MR imaging of the pelvis was performed  both before and after administration of intravenous contrast.  CONTRAST: 55m MULTIHANCE GADOBENATE DIMEGLUMINE 529 MG/ML IV SOLN  COMPARISON: Ultrasound on 08/24/2013 and MRI on 12/19/2009  FINDINGS:  Motion artifact noted.  Uterus: Measures 4 cm in length. Small postmenopausal size and  appearance. No uterine masses identified.  Cervix: Appears normal.  Right ovary: Not well visualized, however no right adnexal mass  identified.  Left ovary: A complex cystic and solid mass is seen in the left  adnexa which measures approximately 5.0 x 2.4 x 3.6 cm. This  contains at least 2 solid enhancing mural nodules each measuring  approximately 1 cm. This is suspicious for cystic ovarian carcinoma.  Urinary Bladder: Unremarkable.  Other: Multilobular soft tissue masses are seen along the left  pelvic sidewall, at and below the level of the iliac bifurcation,  and in the left pelvic cul-de-sac posterior to the lower uterine  segment. These masses measure approximately 4.7 x 6.1 cm and 2.7 x  6.9 cm respectively. These masses appear to involve the sigmoid  colon in this region, but do not definitely arise from the sigmoid  colon. This favors pelvic metastatic disease over primary sigmoid  colon carcinoma. No evidence of bowel obstruction.  No evidence of ascites.  IMPRESSION:  5 cm complex cystic and solid mass in the left adnexa likely arising  from the right ovary, suspicious for  cystic ovarian carcinoma.  Multilobular soft tissue masses along the left pelvic sidewall  extending from the iliac bifurcation to the pelvic cul-de-sac. This  involves the sigmoid colon, but does not appear to arise from the  colon. This favors pelvic metastatic disease over primary sigmoid  colon carcinoma. Given motion artifact on this study, consider  abdomen pelvis CT with contrast for further evaluation.    CT CHEST, ABDOMEN, AND PELVIS WITH CONTRAST 09-23-13  COMPARISON: MRI 09/14/2013  FINDINGS:  CT CHEST FINDINGS  No axillary supraclavicular lymphadenopathy. No mediastinal hilar  lymphadenopathy. No pericardial fluid. This esophagus is normal.  Review of the lung parenchyma demonstrates extensive centrilobular  emphysema. There is linear scarring at the right lung base.  CT ABDOMEN AND PELVIS FINDINGS  No focal hepatic lesion. The gallbladder, pancreas, spleen, adrenal  glands, kidneys are normal.  Stomach, small bowel, and colon are normal without evidence of  obstruction.  Abdominal or is normal caliber. No retroperitoneal periportal  lymphadenopathy. There is nodule along the medial aspect of the  right hepatic lobe measuring 7 mm (image 67, series 2).  In the pelvis, again demonstrated a cystic lesion with a enhancing  nodule in the left adnexa measuring 33 x 42 mm. There enlarged soft  tissue nodules along the left iliac vessels which presumably  represent lymph nodes. Lesion in the left external iliac location  measures 20 mm short axis by 31 mm. Similar 15 mm lesion along the  left common iliac vessel (image 79, series 2.  The uterus is normal.  The right ovary is not clearly identified There is a soft tissue  nodule posterior right of the uterus measuring 22 x 18 mm (image 87,  series 2. This either represents a ovary are 8 nodular implant.  Additional lesion posterior to the left aspect of the uterus on  image 90, series 2.  No inguinal adenopathy. Bladder is  normal. No aggressive  osseous  lesion.  IMPRESSION:  Chest Impression:  1. No evidence thoracic metastasis.  2. Extensive centrilobular emphysema and right basilar scarring.  Abdomen / Pelvis Impression:  1. Again demonstrated cystic lesion with a nodular enhancing  component in the left adnexa. This is described on comparison MRI.  2. Soft tissue nodules along the left iliac vessels likely represent  abnormally enlarged lymph nodes. These do not appear associated with  the bowel.  3. Two soft tissue nodules posterior to the uterus likely represent  nodular implants.  4. Single nodule along the inferior medial margin of the liver.  Cannot exclude a peritoneal metastasis.       CLINICAL DATA: 80 year old female with a complex cystic lesion  arising from the left ovary in addition to left pelvic sidewall an  peritoneal cul-de-sac soft tissue masses. Overall findings are  concerning for possible ovarian cystic carcinoma with nodal were  peritoneal metastatic disease. CT-guided biopsy is warranted for  tissue diagnosis.  EXAM:  CT BIOPSY  Date: 09/27/2013  PROCEDURE:  1. CT-guided biopsy of mass versus nodal conglomerate in the  cul-de-sac of [place]  Interventional Radiologist: Criselda Peaches, MD  ANESTHESIA/SEDATION:  Moderate (conscious) sedation was used. Two mg Versed, 100 mcg  Fentanyl were administered intravenously. The patient's vital signs  were monitored continuously by radiology nursing throughout the  procedure.  Sedation Time: 15 minutes  TECHNIQUE:  Informed consent was obtained from the patient following explanation  of the procedure, risks, benefits and alternatives. The patient  understands, agrees and consents for the procedure. All questions  were addressed. A time out was performed.  A planning axial CT scan was performed. The soft tissue mass in the  left aspect of the cul-de-sac of Nathaneil Canary was successfully  identified. Residual oral contrast  material is present within the  sigmoid colon and rectum nicely outlining their locations.  A suitable skin entry site was selected and marked. The region was  then sterilely prepped and draped in the usual fashion using  Betadine skin prep. Local anesthesia was attained by infiltration  with 1% lidocaine. A small dermatotomy was made. Under intermittent  CT fluoroscopic guidance, an 17 gauge trocar needle was advanced  through a left trans gluteal approach and into the margin of the  soft tissue mass. Multiple 18 gauge core biopsies were then  coaxially obtained using the bio Pince automated biopsy device.  Biopsy specimens were placed in saline and delivered to pathology  for further analysis.  Post biopsy imaging demonstrates no evidence of complication. The  patient tolerated the procedure well.  IMPRESSION:  Technically successful core biopsy of soft tissue mass in the left  aspect of the pelvic cul-de      PATHOLOGY JAVANNA, PATIN N Collected: 09/27/2013 Client: [facility] Accession: [id number] Received: 09/27/2013 Jacqulynn Cadet, MDL PATHOLOGY FINAL DIAGNOSIS Diagnosis Soft Tissue Needle Core Biopsy, left pelvic - POORLY DIFFERENTIATED ADENOCARCINOMA, PLEASE SEE COMMENT. Microscopic Comment The biopsies are composed of nests of malignant glandular cells with associated lymphoplasmacytic inflammation. Immunohistochemical stains were performed and the malignant cells show the following immunoprofile: Cytokeratin 7 - strongly positive. Cytokeratin 20 - negative p16 - strongly positive ER - strongly positive CDX2 - negative p53 - focally positive CD45 - negative Napsin-A - negative TTF-1 - negative GCDFP - negative WT1-positive The control stained appropriately. The overall findings are mostly consistent with poorly differentiated adenocarcinoma (endometrioid phenotype) and gynecologic/ovarian primary is favored.  DISCUSSION: We have discussed  circumstances around her diagnosis and  the diagnostic scans and interventions as above; I have told them that pathology is consistent with gyn primary. I have told them clearly that I do not expect chemotherapy to give long term cure, but that it may shrink and control the disease for some period of time. I have emphasized that poor performance status and significant comorbidities limit chemotherapy options, and that, even with careful management, chemo may be more difficult than she can tolerate. She and family understand that we can stop chemotherapy at any point if needed or preferred. I do not think she is appropriate for taxol with carboplatin, but may be able to tolerate single agent carboplatin if given carefully. I have explained that carboplatin as single agent can be useful with gyn cancers and generally is tolerated adequately in patients who have other medical problems.As patient and husband did not attend chemotherapy education class (tho 2 daughters did attend and took careful notes for patient), RN has gone over carboplatin information with patient, husband and daughter thoroughly now. All questions have been answered and patient + family are in agreement with proceeding with single agent carboplatin. I have encouraged her to increase the supplement drink, which she used more frequently when recovering from C diff. I will ask [facility] nutritionist to follow up. I have told patient and family that she will do better with chemo if she can optimize nutritional status.   She will be given collection materials for stool specimen for C.diff, due to complaints of intermittent diarrhea and history of C diff. Stool specimen was negative for C diff 01-2013 (last in EMR)   IMPRESSION / PLAN:  1. 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 Agree with Dr Denman George that she is not  candidate for aggressive treatment attempts, tho she and family request attempt at intervention with chemotherapy. Will begin with single agent carboplatin probably within the week. I will see her back with counts and chemistries following treatment. 2. Emphysema on continuous O2, past tobacco. Several hospitalizations for pulmonary infections in past 2 years. Known to [facility] 3. C difficile colitis 2013- 2014. Still intermittent diarrhea by history, so will repeat C diff evaluation  4.Decreased EF 2014 when she was acutely ill with other problems 5.severe protein calorie malnutrition previously, and still appears suboptimal, with weight still ~ 20 lbs below baseline. Plan as above 6.radicular LLE pain: may be from LS orthopedic problems or may be related to left pelvic sidewall involvement with the gyn tumor. Known to Dr Lynann Bologna; MRI LS thru her office in ~ July. 7.Advance Directives in place. 8.osteoporosis: on q 6 mo Prolia tho I am not clear from which MD. Needs Ca/D with prolia. Will hold off on scheduling Sept Prolia until we see situation closer to that time 9.flu vaccine done this month 10.hx diverticulosis    Patient and accompanying individuals have had questions answered to their satisfaction and are in agreement with plan above. They can contact this office for questions or concerns at any time prior to next scheduled visit.  Verbal consent obtained. Antiemetics as above. Prior auth requested. Chemo orders placed.  Time spent  60+ min, including >50% discussion and coordination of care.    Octavio Matheney P, MD 10/01/2013 1:07 PM

## 2013-10-03 ENCOUNTER — Encounter: Payer: Self-pay | Admitting: Oncology

## 2013-10-04 ENCOUNTER — Other Ambulatory Visit: Payer: Self-pay

## 2013-10-04 ENCOUNTER — Telehealth: Payer: Self-pay | Admitting: Oncology

## 2013-10-04 DIAGNOSIS — C562 Malignant neoplasm of left ovary: Secondary | ICD-10-CM

## 2013-10-04 LAB — CA 125(PREVIOUS METHOD): CA 125: 3101.5 U/mL — ABNORMAL HIGH (ref 0.0–30.2)

## 2013-10-04 LAB — CA 125: CA 125: 4861 U/mL — AB (ref <35)

## 2013-10-04 NOTE — Telephone Encounter (Signed)
late note..........Marland Kitchen pt given appt schedule prior to leaving 10/01/13............ by mdale

## 2013-10-06 ENCOUNTER — Ambulatory Visit (HOSPITAL_BASED_OUTPATIENT_CLINIC_OR_DEPARTMENT_OTHER): Payer: Medicare Other

## 2013-10-06 ENCOUNTER — Other Ambulatory Visit: Payer: Self-pay

## 2013-10-06 ENCOUNTER — Ambulatory Visit: Payer: Medicare Other

## 2013-10-06 VITALS — BP 144/64 | HR 97 | Temp 97.1°F | Resp 18

## 2013-10-06 DIAGNOSIS — Z5111 Encounter for antineoplastic chemotherapy: Secondary | ICD-10-CM

## 2013-10-06 DIAGNOSIS — C562 Malignant neoplasm of left ovary: Secondary | ICD-10-CM

## 2013-10-06 DIAGNOSIS — C801 Malignant (primary) neoplasm, unspecified: Secondary | ICD-10-CM

## 2013-10-06 LAB — CLOSTRIDIUM DIFFICILE BY PCR: Toxigenic C. Difficile by PCR: NEGATIVE

## 2013-10-06 MED ORDER — SODIUM CHLORIDE 0.9 % IV SOLN
154.2000 mg | Freq: Once | INTRAVENOUS | Status: AC
  Administered 2013-10-06: 150 mg via INTRAVENOUS
  Filled 2013-10-06: qty 15

## 2013-10-06 MED ORDER — SODIUM CHLORIDE 0.9 % IV SOLN
Freq: Once | INTRAVENOUS | Status: AC
  Administered 2013-10-06: 12:00:00 via INTRAVENOUS

## 2013-10-06 MED ORDER — DEXAMETHASONE SODIUM PHOSPHATE 10 MG/ML IJ SOLN
10.0000 mg | Freq: Once | INTRAMUSCULAR | Status: AC
  Administered 2013-10-06: 10 mg via INTRAVENOUS

## 2013-10-06 MED ORDER — ONDANSETRON 16 MG/50ML IVPB (CHCC)
16.0000 mg | Freq: Once | INTRAVENOUS | Status: AC
  Administered 2013-10-06: 16 mg via INTRAVENOUS

## 2013-10-06 NOTE — Patient Instructions (Addendum)
[facility] Discharge Instructions for Patients Receiving Chemotherapy  Today you received the following chemotherapy agents Carboplatin.   To help prevent nausea and vomiting after your treatment, we encourage you to take your nausea medication as directed.    If you develop nausea and vomiting that is not controlled by your nausea medication, call the clinic.   BELOW ARE SYMPTOMS THAT SHOULD BE REPORTED IMMEDIATELY:  *FEVER GREATER THAN 100.5 F  *CHILLS WITH OR WITHOUT FEVER  NAUSEA AND VOMITING THAT IS NOT CONTROLLED WITH YOUR NAUSEA MEDICATION  *UNUSUAL SHORTNESS OF BREATH  *UNUSUAL BRUISING OR BLEEDING  TENDERNESS IN MOUTH AND THROAT WITH OR WITHOUT PRESENCE OF ULCERS  *URINARY PROBLEMS  *BOWEL PROBLEMS  UNUSUAL RASH Items with * indicate a potential emergency and should be followed up as soon as possible.  Feel free to call the clinic you have any questions or concerns. The clinic phone number is [phone].   Carboplatin injection What is this medicine? CARBOPLATIN (KAR boe pla tin) is a chemotherapy drug. It targets fast dividing cells, like cancer cells, and causes these cells to die. This medicine is used to treat ovarian cancer and many other cancers. This medicine may be used for other purposes; ask your health care provider or pharmacist if you have questions. COMMON BRAND NAME(S): Paraplatin What should I tell my health care provider before I take this medicine? They need to know if you have any of these conditions: -blood disorders -hearing problems -kidney disease -recent or ongoing radiation therapy -an unusual or allergic reaction to carboplatin, cisplatin, other chemotherapy, other medicines, foods, dyes, or preservatives -pregnant or trying to get pregnant -breast-feeding How should I use this medicine? This drug is usually given as an infusion into a vein. It is administered in a hospital or clinic by a specially trained health  care professional. Talk to your pediatrician regarding the use of this medicine in children. Special care may be needed. Overdosage: If you think you have taken too much of this medicine contact a poison control center or emergency room at once. NOTE: This medicine is only for you. Do not share this medicine with others. What if I miss a dose? It is important not to miss a dose. Call your doctor or health care professional if you are unable to keep an appointment. What may interact with this medicine? -medicines for seizures -medicines to increase blood counts like filgrastim, pegfilgrastim, sargramostim -some antibiotics like amikacin, gentamicin, neomycin, streptomycin, tobramycin -vaccines Talk to your doctor or health care professional before taking any of these medicines: -acetaminophen -aspirin -ibuprofen -ketoprofen -naproxen This list may not describe all possible interactions. Give your health care provider a list of all the medicines, herbs, non-prescription drugs, or dietary supplements you use. Also tell them if you smoke, drink alcohol, or use illegal drugs. Some items may interact with your medicine. What should I watch for while using this medicine? Your condition will be monitored carefully while you are receiving this medicine. You will need important blood work done while you are taking this medicine. This drug may make you feel generally unwell. This is not uncommon, as chemotherapy can affect healthy cells as well as cancer cells. Report any side effects. Continue your course of treatment even though you feel ill unless your doctor tells you to stop. In some cases, you may be given additional medicines to help with side effects. Follow all directions for their use. Call your doctor or health care professional for advice if  you get a fever, chills or sore throat, or other symptoms of a cold or flu. Do not treat yourself. This drug decreases your body's ability to fight  infections. Try to avoid being around people who are sick. This medicine may increase your risk to bruise or bleed. Call your doctor or health care professional if you notice any unusual bleeding. Be careful brushing and flossing your teeth or using a toothpick because you may get an infection or bleed more easily. If you have any dental work done, tell your dentist you are receiving this medicine. Avoid taking products that contain aspirin, acetaminophen, ibuprofen, naproxen, or ketoprofen unless instructed by your doctor. These medicines may hide a fever. Do not become pregnant while taking this medicine. Women should inform their doctor if they wish to become pregnant or think they might be pregnant. There is a potential for serious side effects to an unborn child. Talk to your health care professional or pharmacist for more information. Do not breast-feed an infant while taking this medicine. What side effects may I notice from receiving this medicine? Side effects that you should report to your doctor or health care professional as soon as possible: -allergic reactions like skin rash, itching or hives, swelling of the face, lips, or tongue -signs of infection - fever or chills, cough, sore throat, pain or difficulty passing urine -signs of decreased platelets or bleeding - bruising, pinpoint red spots on the skin, black, tarry stools, nosebleeds -signs of decreased red blood cells - unusually weak or tired, fainting spells, lightheadedness -breathing problems -changes in hearing -changes in vision -chest pain -high blood pressure -low blood counts - This drug may decrease the number of white blood cells, red blood cells and platelets. You may be at increased risk for infections and bleeding. -nausea and vomiting -pain, swelling, redness or irritation at the injection site -pain, tingling, numbness in the hands or feet -problems with balance, talking, walking -trouble passing urine or change  in the amount of urine Side effects that usually do not require medical attention (report to your doctor or health care professional if they continue or are bothersome): -hair loss -loss of appetite -metallic taste in the mouth or changes in taste This list may not describe all possible side effects. Call your doctor for medical advice about side effects. You may report side effects to FDA at 1-800-FDA-1088. Where should I keep my medicine? This drug is given in a hospital or clinic and will not be stored at home. NOTE: This sheet is a summary. It may not cover all possible information. If you have questions about this medicine, talk to your doctor, pharmacist, or health care provider.  2015, [employer]. (2007-04-28 14:38:05)

## 2013-10-07 ENCOUNTER — Telehealth: Payer: Self-pay | Admitting: *Deleted

## 2013-10-07 NOTE — Telephone Encounter (Signed)
Called and notified patient C. Diff was negative as noted below by Dr. Marko Plume. Asked patient how she was doing after first chemo treatment yesterday. She states she has a dry cough but is going to try OTC cough medicine. Told patient that was fine but to call us back if symptoms worsen or she develops a fever. Pt states understanding.

## 2013-10-07 NOTE — Telephone Encounter (Signed)
Called Bianca [name] at [phone] number(s).  Message left requesting a return call for chemotherapy follow up.  Awaiting return call from patient.

## 2013-10-07 NOTE — Telephone Encounter (Signed)
Message copied by Christa See on Thu Oct 07, 2013 10:20 AM ------      Message from: Gordy Levan      Created: Thu Oct 07, 2013  8:20 AM       Labs seen and need follow up      Please let her know C diff was negative. NOTE she had first chemo on 9-2 ------

## 2013-10-07 NOTE — Telephone Encounter (Signed)
Message copied by Cherylynn Ridges on Thu Oct 07, 2013  2:31 PM ------      Message from: Kellie Simmering A      Created: Wed Oct 06, 2013  1:18 PM      Regarding: 1st treatment       1st treatment: Carboplatin. Dr.Lennis Livesay.       [phone] (H)      [phone] (M) ------

## 2013-10-08 ENCOUNTER — Telehealth: Payer: Self-pay

## 2013-10-08 NOTE — Telephone Encounter (Signed)
Bianca [name] is doing well thus far after her treatment.  She has not been nauseous.  Drinking ~64 oz of fluid a day.  Eating 4-5 small meals daily. Today she felt more tired.  She got a little shaky and layed down.  No SOB.  Told her that she can feel like a mack truck hit her ~ the 3rd day after the treatment. She knows to call [phone]  if she has any issues over the weekend. Told her that Dr. Marko Plume wanted to wait a little longer before scheduling her prolia to see how she was doing with treatment/symptoms.  Suggested that she discuss at her visit with Dr. Marko Plume on 10-15-13.

## 2013-10-08 NOTE — Telephone Encounter (Signed)
Message copied by Baruch Merl on Fri Oct 08, 2013  3:06 PM ------      Message from: Gordy Levan      Created: Sun Oct 03, 2013 10:32 AM       Please call on Friday 9-4 AND Tues 9-8, after first chemo on 9-2.      If concerns will need to be seen with CBC etc.      LL to see 9-11.            thanks       ------

## 2013-10-12 ENCOUNTER — Telehealth: Payer: Self-pay | Admitting: *Deleted

## 2013-10-12 ENCOUNTER — Other Ambulatory Visit: Payer: Self-pay | Admitting: Oncology

## 2013-10-12 DIAGNOSIS — E43 Unspecified severe protein-calorie malnutrition: Secondary | ICD-10-CM

## 2013-10-12 DIAGNOSIS — C562 Malignant neoplasm of left ovary: Secondary | ICD-10-CM

## 2013-10-12 NOTE — Telephone Encounter (Signed)
Patient called on call answering service on 10/11/13 in regards to cough from COPD but just wanted to make sure she  Didn't need to do anything different since recent CA diagnosis. Called today to check on patient - states cough is better today and that she sleeps with humidifier at night which helps. Patient states she has not had a fever and is drinking approximately 64 oz of fluid a day. Reminded patient of her appointment this Friday, 10/15/13 for lab and Dr. Marko Plume. Patient is agreeable to this and appreciative of call.

## 2013-10-13 ENCOUNTER — Telehealth: Payer: Self-pay | Admitting: Oncology

## 2013-10-13 NOTE — Telephone Encounter (Signed)
per pof to sch pt appt w/ Barb neff. Sent barb a staff message to see when she could see pt-will call once reply

## 2013-10-14 ENCOUNTER — Telehealth: Payer: Self-pay | Admitting: Oncology

## 2013-10-14 NOTE — Telephone Encounter (Signed)
cld & spoke to pt per reply from Dory [place] to put pt in for nut appt @ 12:30. Sent Barf email to put on her sch and make appt for pt because there is no avail spot. Pt stated will be here.

## 2013-10-15 ENCOUNTER — Other Ambulatory Visit (HOSPITAL_BASED_OUTPATIENT_CLINIC_OR_DEPARTMENT_OTHER): Payer: Medicare Other

## 2013-10-15 ENCOUNTER — Telehealth: Payer: Self-pay | Admitting: Oncology

## 2013-10-15 ENCOUNTER — Ambulatory Visit: Payer: Medicare Other | Admitting: Nutrition

## 2013-10-15 ENCOUNTER — Encounter: Payer: Self-pay | Admitting: Oncology

## 2013-10-15 ENCOUNTER — Ambulatory Visit (HOSPITAL_BASED_OUTPATIENT_CLINIC_OR_DEPARTMENT_OTHER): Payer: Medicare Other | Admitting: Oncology

## 2013-10-15 VITALS — BP 113/66 | HR 94 | Temp 98.1°F | Resp 20 | Ht 59.0 in | Wt 81.7 lb

## 2013-10-15 DIAGNOSIS — IMO0002 Reserved for concepts with insufficient information to code with codable children: Secondary | ICD-10-CM

## 2013-10-15 DIAGNOSIS — C562 Malignant neoplasm of left ovary: Secondary | ICD-10-CM

## 2013-10-15 DIAGNOSIS — C801 Malignant (primary) neoplasm, unspecified: Secondary | ICD-10-CM

## 2013-10-15 DIAGNOSIS — M810 Age-related osteoporosis without current pathological fracture: Secondary | ICD-10-CM

## 2013-10-15 LAB — BASIC METABOLIC PANEL (CC13)
ANION GAP: 10 meq/L (ref 3–11)
BUN: 17.8 mg/dL (ref 7.0–26.0)
CO2: 28 mEq/L (ref 22–29)
Calcium: 9.7 mg/dL (ref 8.4–10.4)
Chloride: 98 mEq/L (ref 98–109)
Creatinine: 0.7 mg/dL (ref 0.6–1.1)
GLUCOSE: 91 mg/dL (ref 70–140)
Potassium: 4.6 mEq/L (ref 3.5–5.1)
Sodium: 137 mEq/L (ref 136–145)

## 2013-10-15 LAB — CBC WITH DIFFERENTIAL/PLATELET
BASO%: 0.7 % (ref 0.0–2.0)
Basophils Absolute: 0.1 10*3/uL (ref 0.0–0.1)
EOS ABS: 0.2 10*3/uL (ref 0.0–0.5)
EOS%: 2.5 % (ref 0.0–7.0)
HCT: 38.2 % (ref 34.8–46.6)
HEMOGLOBIN: 12.4 g/dL (ref 11.6–15.9)
LYMPH%: 19.2 % (ref 14.0–49.7)
MCH: 31.1 pg (ref 25.1–34.0)
MCHC: 32.6 g/dL (ref 31.5–36.0)
MCV: 95.5 fL (ref 79.5–101.0)
MONO#: 0.7 10*3/uL (ref 0.1–0.9)
MONO%: 9.9 % (ref 0.0–14.0)
NEUT#: 5 10*3/uL (ref 1.5–6.5)
NEUT%: 67.7 % (ref 38.4–76.8)
PLATELETS: 292 10*3/uL (ref 145–400)
RBC: 4 10*6/uL (ref 3.70–5.45)
RDW: 13.6 % (ref 11.2–14.5)
WBC: 7.4 10*3/uL (ref 3.9–10.3)
lymph#: 1.4 10*3/uL (ref 0.9–3.3)

## 2013-10-15 NOTE — Telephone Encounter (Signed)
, °

## 2013-10-15 NOTE — Progress Notes (Signed)
OFFICE PROGRESS NOTE   10/15/2013   Physicians:Emma Charlynne Cousins, V.Leschber, M.Magod,_ Irena Reichmann ([place] [phone])   INTERVAL HISTORY:   Patient is seen, alone for visit tho accompanied to office by husband, in follow up of first chemotherapy with single agent carboplatin given 10-06-13 for advanced, poorly differentiated gyn adenocarcinoma. She has tolerated the first chemotherapy without problems thus far. Patient denies any difficulty with peripheral IV access for chemo. She had 2 episodes of mild nausea, improved with zofran.Bowels have been moving, no diarrhea. She has been eating usual 5-6x daily with small amounts, and drinking fluids. Back pain is at baseline, no bleeding.  She does not have PAC. She prefers to get flu vaccine at her pharmacy. She has not had genetics testing. She has living will and HCPOA.  ONCOLOGIC HISTORY Patient was seen by Dr Lynann Bologna for radicular left lower extremity pain, which had been ongoing for several months. MRI LS 08-23-2013 reportedly showed pelvic mass. She had pelvic US in [facility] system by Dr Lynann Bologna on 08-24-13, with left adnexal mass cystic and solid, with solid area up to 4.5 cm and adjacent cyst up to 4.1 cm and additionally a solid appearing lesion along posterior cervix or lower uterine segment, recognizable ovaries not seen bilaterally and endometrial stripe 2 mm. She saw Dr Denman George in consultation on  MRI pelvis was obtained 09-14-13 to better delineate process, with 5 cm complex cystic and solid mass in left adnexa, multilobular soft tissue masses along left pelvic sidewall, involving sigmoid colon tho do not appear to arise from colon, radiographically favored to be pelvic metastatic disease over primary sigmoid colon disease. She had CT CAP on 09-23-13 for staging, with extensive emphysema, single nodule along inferior medial margin of liver and disease in pelvis. She had CT biopsy of  soft tissue mass in left aspect of pelvic cul-de-sac on 09-27-13, with pathology ([id number]) showing poorly differentiated adenocarcinoma, IHC most consistent with gyn primary. Dr Denman George does not feel that she could tolerate surgery + chemotherapy, or that surgery would be optimal debulking, so has recommended that treatment begin with chemotherapy. It is doubtful that she will be a surgical candidate in future, but this could be considered later depending on response to chemotherapy and overall condition. She had cycle 1 single agent carboplatin on 10-06-13.  Review of systems as above, also: Breathing is at baseline; she did not bring portable O2 into exam room "too heavy for me to manage" "I can go a little while without it". Taste ok. Able to sleep. No bleeding. No LE swelling. Tho she told dietician today that she has not lost weight, she does confirm that she lost 20-25 lbs with C diff ~ a year ago, getting down to 60 lbs then. Remainder of 10 point Review of Systems negative.  Objective:  Vital signs in last 24 hours:  BP 113/66  Pulse 94  Temp(Src) 98.1 F (36.7 C) (Oral)  Resp 20  Ht 4\' 11"  (1.499 m)  Wt 81 lb 11.2 oz (37.059 kg)  BMI 16.49 kg/m2 Weight is stable. Looks frail, respirations slightly labored with activity in exam room without O2. Color not great, not cyanotic. Alert, oriented and appropriate. Ambulatory slowly but without assistance, gets on and off exam table with assistance of one. No alopecia  HEENT:PERRL, sclerae not icteric. Oral mucosa moist without lesions, posterior pharynx clear.  Neck supple. No JVD.  Lymphatics:no cervical,supraclavicular, axillary or inguinal adenopathy Resp: diminished BS  thruout without wheezes or rales, hyperresonant to percussion bilaterally Cardio: regular rate and rhythm. No gallop. GI: soft, nontender, not distended, no mass or organomegaly. Normally active bowel sounds.  Musculoskeletal/ Extremities: without pitting edema, cords,  tenderness. Minimal muscle mass thruout. Neuro: no peripheral neuropathy. Otherwise nonfocal. PSYCH appropriate mood and affect Skin without rash, ecchymosis, petechiae   Lab Results:  Results for orders placed in visit on 10/15/13  CBC WITH DIFFERENTIAL      Result Value Ref Range   WBC 7.4  3.9 - 10.3 10e3/uL   NEUT# 5.0  1.5 - 6.5 10e3/uL   HGB 12.4  11.6 - 15.9 g/dL   HCT 38.2  34.8 - 46.6 %   Platelets 292  145 - 400 10e3/uL   MCV 95.5  79.5 - 101.0 fL   MCH 31.1  25.1 - 34.0 pg   MCHC 32.6  31.5 - 36.0 g/dL   RBC 4.00  3.70 - 5.45 10e6/uL   RDW 13.6  11.2 - 14.5 %   lymph# 1.4  0.9 - 3.3 10e3/uL   MONO# 0.7  0.1 - 0.9 10e3/uL   Eosinophils Absolute 0.2  0.0 - 0.5 10e3/uL   Basophils Absolute 0.1  0.0 - 0.1 10e3/uL   NEUT% 67.7  38.4 - 76.8 %   LYMPH% 19.2  14.0 - 49.7 %   MONO% 9.9  0.0 - 14.0 %   EOS% 2.5  0.0 - 7.0 %   BASO% 0.7  0.0 - 2.0 %  BASIC METABOLIC PANEL (ZM62)      Result Value Ref Range   Sodium 137  136 - 145 mEq/L   Potassium 4.6  3.5 - 5.1 mEq/L   Chloride 98  98 - 109 mEq/L   CO2 28  22 - 29 mEq/L   Glucose 91  70 - 140 mg/dl   BUN 17.8  7.0 - 26.0 mg/dL   Creatinine 0.7  0.6 - 1.1 mg/dL   Calcium 9.7  8.4 - 10.4 mg/dL   Anion Gap 10  3 - 11 mEq/L    C diff was negative 10-06-13, sent due to complaints of intermittent diarrhea and history of severe C diff ~ a year ago  CA 125 as baseline for chemo was 4861 by new lab methodology (3101 by previous methodology, 5276 reported in July)  No gCSF thus far  Studies/Results:  No results found.  Medications: I have reviewed the patient's current medications, including use of prn antiemetics. She is due Prolia by Dr Layne Benton (q 6 months), which she has tolerated without difficulty in past. She should be on good supplemental calcium and D with the Prolia  DISCUSSION: She is anxious to have Prolia on schedule and I have suggested she get this during ~ week 3 after chemo and have given her written  suggested dates for this, which she will coordinate with Dr Kathrin Penner office. We have discussed fact that blood counts may decrease further in this next week from first chemo, so that she should call if any problems prior to my next visit and labs on 10-20-13. Dietician has suggested increasing her Muscle Milk supplement to bid.    Assessment/Plan: 1. 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 Agree with Dr Denman George that she is not candidate for aggressive treatment attempts, tho she and family request attempt at intervention with chemotherapy.  Single agent carboplatin begun 10-06-13. I will see  her back with counts and chemistries 10-20-13; she will be due cycle 2 on 10-27-13. 2. Emphysema on continuous O2, past tobacco. Several hospitalizations for pulmonary infections in past 2 years. Known to [facility]  3. C difficile colitis 2013- 2014. Still intermittent diarrhea but C diff negative 10-06-13 4.Decreased EF 2014 when she was acutely ill with other problems  5.severe protein calorie malnutrition with C diff, weight loss down to 60 lbs then. PO intake actually reasonable now per [place], tho will try to increase supplement to bid. 6.radicular LLE pain: may be from LS orthopedic problems or may be related to left pelvic sidewall involvement with the gyn tumor. Known to Dr Lynann Bologna; MRI LS thru her office in ~ July. No worse. 7.Advance Directives in place.  8.osteoporosis: on q 6 mo Prolia from Dr Layne Benton. Needs Ca/D with prolia.  9. Needs flu vaccine any location, not on day of chemo 10.hx diverticulosis    TIme spent 25 min including >50% counseling and coordination of care. She knows to call prior to next scheduled visit if needed.    Vona Whiters P, MD   10/15/2013, 2:14 PM

## 2013-10-15 NOTE — Progress Notes (Signed)
80 year old female diagnosed with gyn cancer.  She is a patient of Dr. Marko Plume.  Past medical history includes oxygen dependence, COPD, emphysema, diverticulosis, osteoporosis, history of C. difficile positive.  Medications include Imodium, Ativan, multivitamin, and Zofran.  Labs include recent C. difficile negative and albumin 4.0.  Height: 4 feet 11 inches. Weight: 82 pounds. Usual body weight: 80 pounds per patient. BMI: 16.56.  Patient denies nutrition side effects.  She denies any diarrhea.  She states she did have an episode of nausea but it was because she was riding in the back seat of the car.  Patient eats small meals 5-6 times daily.  She sleeps 8 hours a night.  Dietary recall reveals patient is consuming a well rounded diet with adequate calories and protein.  Patient states this is a usual weight for her.  Nutrition diagnosis: Food and nutrition related knowledge deficit related to new diagnosis of GYN cancer as evidenced by no prior need for nutrition related information.  Intervention:  Encouraged patient to continue small frequent meals as described.   Recommended patient add one half bottle Muscle milk in the evenings, in addition to the one half bottle she drinks at breakfast.   Also reviewed additional protein sources throughout the day.   Reviewed easy to purchase meals and snacks since patient has limited ability to stand and cook secondary to pain.   Provided fact sheets and contact information.   Questions were answered and teach back method was used.  Monitoring, evaluation, goals: Patient will tolerate adequate calories and protein to promote weight maintenance.  Next visit: Patient to contact me for questions.   **Disclaimer: This note was dictated with voice recognition software. Similar sounding words can inadvertently be transcribed and this note may contain transcription errors which may not have been corrected upon publication of note.**

## 2013-10-17 ENCOUNTER — Other Ambulatory Visit: Payer: Self-pay | Admitting: Oncology

## 2013-10-20 ENCOUNTER — Encounter: Payer: Self-pay | Admitting: Oncology

## 2013-10-20 ENCOUNTER — Other Ambulatory Visit (HOSPITAL_BASED_OUTPATIENT_CLINIC_OR_DEPARTMENT_OTHER): Payer: Medicare Other

## 2013-10-20 ENCOUNTER — Ambulatory Visit (HOSPITAL_BASED_OUTPATIENT_CLINIC_OR_DEPARTMENT_OTHER): Payer: Medicare Other | Admitting: Oncology

## 2013-10-20 VITALS — BP 151/76 | HR 113 | Temp 98.2°F | Resp 18 | Ht 59.0 in | Wt 82.5 lb

## 2013-10-20 DIAGNOSIS — M810 Age-related osteoporosis without current pathological fracture: Secondary | ICD-10-CM

## 2013-10-20 DIAGNOSIS — C562 Malignant neoplasm of left ovary: Secondary | ICD-10-CM

## 2013-10-20 DIAGNOSIS — C801 Malignant (primary) neoplasm, unspecified: Secondary | ICD-10-CM

## 2013-10-20 DIAGNOSIS — E43 Unspecified severe protein-calorie malnutrition: Secondary | ICD-10-CM

## 2013-10-20 DIAGNOSIS — J438 Other emphysema: Secondary | ICD-10-CM

## 2013-10-20 DIAGNOSIS — R197 Diarrhea, unspecified: Secondary | ICD-10-CM

## 2013-10-20 LAB — COMPREHENSIVE METABOLIC PANEL (CC13)
ALT: 8 U/L (ref 0–55)
AST: 32 U/L (ref 5–34)
Albumin: 4 g/dL (ref 3.5–5.0)
Alkaline Phosphatase: 78 U/L (ref 40–150)
Anion Gap: 10 mEq/L (ref 3–11)
BILIRUBIN TOTAL: 0.43 mg/dL (ref 0.20–1.20)
BUN: 19.1 mg/dL (ref 7.0–26.0)
CO2: 28 mEq/L (ref 22–29)
CREATININE: 0.7 mg/dL (ref 0.6–1.1)
Calcium: 9.8 mg/dL (ref 8.4–10.4)
Chloride: 100 mEq/L (ref 98–109)
Glucose: 145 mg/dl — ABNORMAL HIGH (ref 70–140)
Potassium: 4.2 mEq/L (ref 3.5–5.1)
Sodium: 139 mEq/L (ref 136–145)
Total Protein: 8.7 g/dL — ABNORMAL HIGH (ref 6.4–8.3)

## 2013-10-20 LAB — CBC WITH DIFFERENTIAL/PLATELET
BASO%: 0.4 % (ref 0.0–2.0)
Basophils Absolute: 0 10*3/uL (ref 0.0–0.1)
EOS%: 2.8 % (ref 0.0–7.0)
Eosinophils Absolute: 0.2 10*3/uL (ref 0.0–0.5)
HCT: 39 % (ref 34.8–46.6)
HGB: 12.5 g/dL (ref 11.6–15.9)
LYMPH#: 1.3 10*3/uL (ref 0.9–3.3)
LYMPH%: 17.7 % (ref 14.0–49.7)
MCH: 30.9 pg (ref 25.1–34.0)
MCHC: 32 g/dL (ref 31.5–36.0)
MCV: 96.6 fL (ref 79.5–101.0)
MONO#: 0.5 10*3/uL (ref 0.1–0.9)
MONO%: 7.3 % (ref 0.0–14.0)
NEUT#: 5.3 10*3/uL (ref 1.5–6.5)
NEUT%: 71.8 % (ref 38.4–76.8)
PLATELETS: 312 10*3/uL (ref 145–400)
RBC: 4.04 10*6/uL (ref 3.70–5.45)
RDW: 13.6 % (ref 11.2–14.5)
WBC: 7.4 10*3/uL (ref 3.9–10.3)

## 2013-10-20 NOTE — Progress Notes (Signed)
OFFICE PROGRESS NOTE   10/20/2013   Physicians:Emma Charlynne Cousins, V.Leschber, M.Magod,_ Irena Reichmann ([place] [phone])   INTERVAL HISTORY:  Patient is seen, together with another daughter, in continuing attention to chemotherapy with single agent carboplatin begun 10-06-13 as intervention for extensive, poorly differentiated gyn carcinoma. She was not surgical candidate based on extent of disease and performance status at diagnosis by CT biopsy 09-2013. She has tolerated the first cycle of dose reduced single agent carboplatin without significant adverse effects. Patient complains of ongoing low back pain, which she has had for a number of years but which has been more bothersome for at least 3 months. She uses tramadol 50 mg usually once daily, which does help the low back pain but causes her to feel shaky or vaguely not comfortable. The pain prevents her from standing for extended time. She has not needed any additional nausea medication and has tried to increase supplement drink. She has had no worsening of respiratory symptoms, on continuous oxygen. Bowels are moving, without diarrhea.  She does not have PAC. She has not had flu vaccine. She has not had genetics testing.  ONCOLOGIC HISTORY Patient was seen by Dr Lynann Bologna for radicular left lower extremity pain, which had been ongoing for several months. MRI LS 08-23-2013 reportedly showed pelvic mass. She had pelvic US in [facility] by Dr Lynann Bologna on 08-24-13, with left adnexal mass cystic and solid, with solid area up to 4.5 cm and adjacent cyst up to 4.1 cm and additionally a solid appearing lesion along posterior cervix or lower uterine segment, recognizable ovaries not seen bilaterally and endometrial stripe 2 mm. She saw Dr Denman George in consultation on  MRI pelvis was obtained 09-14-13 to better delineate process, with 5 cm complex cystic and solid mass in left adnexa, multilobular soft  tissue masses along left pelvic sidewall, involving sigmoid colon tho do not appear to arise from colon, radiographically favored to be pelvic metastatic disease over primary sigmoid colon disease. She had CT CAP on 09-23-13 for staging, with extensive emphysema, single nodule along inferior medial margin of liver and disease in pelvis. She had CT biopsy of soft tissue mass in left aspect of pelvic cul-de-sac on 09-27-13, with pathology ([id number]) showing poorly differentiated adenocarcinoma, IHC most consistent with gyn primary. Dr Denman George does not feel that she could tolerate surgery + chemotherapy, or that surgery would be optimal debulking, so has recommended that treatment begin with chemotherapy. It is doubtful that she will be a surgical candidate in future, but this could be considered later depending on response to chemotherapy and overall condition.  Review of systems as above, also: No fever or symptoms of infection. No bleeding. No LE swelling. Voiding ok. Is able to sleep. No different pain. Back pain not presently radiating to LE. Remainder of 10 point Review of Systems negative.  Objective:  Vital signs in last 24 hours:  BP 151/76  Pulse 113  Temp(Src) 98.2 F (36.8 C) (Oral)  Resp 18  Ht 4\' 11"  (1.499 m)  Wt 82 lb 8 oz (37.422 kg)  BMI 16.65 kg/m2  SpO2 98% weight is up 1 lb. Daughter very supportive. Frail appearing elderly lady, alert, oriented and appropriate. Ambulatory with assistance. On portable O2. No alopecia  HEENT:PERRL, sclerae not icteric. Oral mucosa moist without lesions, posterior pharynx clear.  Neck supple. No JVD.  Lymphatics:no cervical,suraclavicular or inguinal adenopathy Resp: diminished BS thruout otherwise clear to auscultation bilaterally  Cardio:  regular rate and rhythm. No gallop. GI: soft, nontender, not distended, no clear mass or organomegaly. Some bowel sounds.  Musculoskeletal/ Extremities: without pitting edema, cords, tenderness. Fingers  with clubbing. Neuro: no peripheral neuropathy. Otherwise nonfocal. PSYCH: appropriate mood and affect Skin without rash, ecchymosis, petechiae   Lab Results:  Results for orders placed in visit on 10/20/13  CBC WITH DIFFERENTIAL      Result Value Ref Range   WBC 7.4  3.9 - 10.3 10e3/uL   NEUT# 5.3  1.5 - 6.5 10e3/uL   HGB 12.5  11.6 - 15.9 g/dL   HCT 39.0  34.8 - 46.6 %   Platelets 312  145 - 400 10e3/uL   MCV 96.6  79.5 - 101.0 fL   MCH 30.9  25.1 - 34.0 pg   MCHC 32.0  31.5 - 36.0 g/dL   RBC 4.04  3.70 - 5.45 10e6/uL   RDW 13.6  11.2 - 14.5 %   lymph# 1.3  0.9 - 3.3 10e3/uL   MONO# 0.5  0.1 - 0.9 10e3/uL   Eosinophils Absolute 0.2  0.0 - 0.5 10e3/uL   Basophils Absolute 0.0  0.0 - 0.1 10e3/uL   NEUT% 71.8  38.4 - 76.8 %   LYMPH% 17.7  14.0 - 49.7 %   MONO% 7.3  0.0 - 14.0 %   EOS% 2.8  0.0 - 7.0 %   BASO% 0.4  0.0 - 2.0 %  COMPREHENSIVE METABOLIC PANEL (JJ88)      Result Value Ref Range   Sodium 139  136 - 145 mEq/L   Potassium 4.2  3.5 - 5.1 mEq/L   Chloride 100  98 - 109 mEq/L   CO2 28  22 - 29 mEq/L   Glucose 145 (*) 70 - 140 mg/dl   BUN 19.1  7.0 - 26.0 mg/dL   Creatinine 0.7  0.6 - 1.1 mg/dL   Total Bilirubin 0.43  0.20 - 1.20 mg/dL   Alkaline Phosphatase 78  40 - 150 U/L   AST 32  5 - 34 U/L   ALT 8  0 - 55 U/L   Total Protein 8.7 (*) 6.4 - 8.3 g/dL   Albumin 4.0  3.5 - 5.0 g/dL   Calcium 9.8  8.4 - 10.4 mg/dL   Anion Gap 10  3 - 11 mEq/L    CA125 available after visit down to 1619 by lab method that compares directly to 3100 on 10-01-13 and 5276 on 08-30-13; by new lab method this is 2068 today (new method giving higher value in everyone).  Studies/Results:  No results found.  Medications: I have reviewed the patient's current medications. Suggested trying tramadol 1/2 tablet for next dose, tho can increase this back to full 50 mg tablet if prefers. Reminded her that she can use tramadol tid prn. Discussed recommendation for calcium + D with Prolia, either  by supplement or to be sure she gets at least 1200 mg calcium and 400 - 800 units D daily in diet. She presently takes one multivitamin gummy daily  DISCUSSION: Meds as above. Patient and daughter are pleased that she has been able to tolerate this first cycle of chemotherapy and agree to continuing carefully, due cycle 2 10-27-13. We will be in touch with patient re CA125 results as above.  Assessment/Plan:  1. 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 Single agent  carboplatin begun 10-06-13 at AUC =3, tolerated well and marker some lower. Will increase dose slightly for cycle 2 on 9-23, however overall status is too poor for chance of cytopenias or significant GI problems. I will see her back ~10-14 days after next chemo. 2. Emphysema on continuous O2, past tobacco. Several hospitalizations for pulmonary infections in past 2 years. Known to [facility]  3. C difficile colitis 2013- 2014. Still intermittent diarrhea but C diff negative 10-06-13  4.Decreased EF 2014 when she was acutely ill with other problems  5.severe protein calorie malnutrition with C diff, weight loss down to 60 lbs then. PO intake  reasonable now per consultation with [facility], still trying to increase supplement. 6.radicular LLE pain: may be from LS orthopedic problems or may be related to left pelvic sidewall involvement with the gyn tumor. Known to Dr Lynann Bologna. In separate communication to me, Dr Denman George mentions possible palliative radiation if needed for pain control or rectal obstruction. 7.Advance Directives in place.  8.osteoporosis: on q 6 mo Prolia from Dr Layne Benton. Needs Ca/D with prolia.  9. Needs flu vaccine any location, not on day of chemo  10.hx diverticulosis   All questions answered. Time spent 25 min  including >50% counseling and coordination of care. Chemo orders entered.    Gordy Levan, MD   10/20/2013, 3:46 PM

## 2013-10-21 ENCOUNTER — Other Ambulatory Visit: Payer: Self-pay | Admitting: Oncology

## 2013-10-21 ENCOUNTER — Telehealth: Payer: Self-pay | Admitting: *Deleted

## 2013-10-21 LAB — CA 125(PREVIOUS METHOD): CA 125: 1619.3 U/mL — ABNORMAL HIGH (ref 0.0–30.2)

## 2013-10-21 LAB — CA 125: CA 125: 2068 U/mL — ABNORMAL HIGH (ref <35)

## 2013-10-21 NOTE — Telephone Encounter (Signed)
Message copied by Christa See on Thu Oct 21, 2013  3:20 PM ------      Message from: Gordy Levan      Created: Thu Oct 21, 2013  1:52 PM       Labs seen and need follow up: please let her know CA125 marker for the cancer was a bit lower yesterday, down to 2068 from 4861 on 10-01-13. This new value is still elevated, but some better since the first chemo. (RN be aware lab method has changed for CA125, so need to be sure tell her these values as correct comparison - thanks. I do not know that she uses MyChart, but can be confusing if so) ------

## 2013-10-21 NOTE — Telephone Encounter (Signed)
Per staff message and POF I have scheduled appts. Advised scheduler of appts. JMW  

## 2013-10-21 NOTE — Telephone Encounter (Signed)
Called patient with CA 125 results as noted below by Dr. Marko Plume. Patient very appreciative of call.

## 2013-10-27 ENCOUNTER — Ambulatory Visit (HOSPITAL_BASED_OUTPATIENT_CLINIC_OR_DEPARTMENT_OTHER): Payer: Medicare Other

## 2013-10-27 ENCOUNTER — Other Ambulatory Visit (HOSPITAL_BASED_OUTPATIENT_CLINIC_OR_DEPARTMENT_OTHER): Payer: Medicare Other

## 2013-10-27 VITALS — BP 105/62 | HR 86 | Temp 97.7°F | Resp 20

## 2013-10-27 DIAGNOSIS — C562 Malignant neoplasm of left ovary: Secondary | ICD-10-CM

## 2013-10-27 DIAGNOSIS — Z5111 Encounter for antineoplastic chemotherapy: Secondary | ICD-10-CM

## 2013-10-27 DIAGNOSIS — C801 Malignant (primary) neoplasm, unspecified: Secondary | ICD-10-CM

## 2013-10-27 LAB — COMPREHENSIVE METABOLIC PANEL (CC13)
ALT: <6 U/L (ref 0–55)
ANION GAP: 7 meq/L (ref 3–11)
AST: 22 U/L (ref 5–34)
Albumin: 3.5 g/dL (ref 3.5–5.0)
Alkaline Phosphatase: 60 U/L (ref 40–150)
BILIRUBIN TOTAL: 0.32 mg/dL (ref 0.20–1.20)
BUN: 15.3 mg/dL (ref 7.0–26.0)
CO2: 30 meq/L — AB (ref 22–29)
Calcium: 9.7 mg/dL (ref 8.4–10.4)
Chloride: 101 mEq/L (ref 98–109)
Creatinine: 0.6 mg/dL (ref 0.6–1.1)
GLUCOSE: 104 mg/dL (ref 70–140)
Potassium: 3.8 mEq/L (ref 3.5–5.1)
SODIUM: 137 meq/L (ref 136–145)
TOTAL PROTEIN: 8.3 g/dL (ref 6.4–8.3)

## 2013-10-27 LAB — CBC WITH DIFFERENTIAL/PLATELET
BASO%: 0.5 % (ref 0.0–2.0)
BASOS ABS: 0 10*3/uL (ref 0.0–0.1)
EOS ABS: 0.1 10*3/uL (ref 0.0–0.5)
EOS%: 1.9 % (ref 0.0–7.0)
HEMATOCRIT: 35.9 % (ref 34.8–46.6)
HEMOGLOBIN: 11.5 g/dL — AB (ref 11.6–15.9)
LYMPH%: 10.7 % — AB (ref 14.0–49.7)
MCH: 31 pg (ref 25.1–34.0)
MCHC: 32 g/dL (ref 31.5–36.0)
MCV: 96.8 fL (ref 79.5–101.0)
MONO#: 0.6 10*3/uL (ref 0.1–0.9)
MONO%: 7.3 % (ref 0.0–14.0)
NEUT%: 79.6 % — AB (ref 38.4–76.8)
NEUTROS ABS: 6 10*3/uL (ref 1.5–6.5)
Platelets: 243 10*3/uL (ref 145–400)
RBC: 3.7 10*6/uL (ref 3.70–5.45)
RDW: 13.8 % (ref 11.2–14.5)
WBC: 7.5 10*3/uL (ref 3.9–10.3)
lymph#: 0.8 10*3/uL — ABNORMAL LOW (ref 0.9–3.3)

## 2013-10-27 MED ORDER — SODIUM CHLORIDE 0.9 % IV SOLN
180.0000 mg | Freq: Once | INTRAVENOUS | Status: AC
  Administered 2013-10-27: 180 mg via INTRAVENOUS
  Filled 2013-10-27: qty 18

## 2013-10-27 MED ORDER — SODIUM CHLORIDE 0.9 % IV SOLN
Freq: Once | INTRAVENOUS | Status: AC
  Administered 2013-10-27: 11:00:00 via INTRAVENOUS

## 2013-10-27 MED ORDER — DEXAMETHASONE SODIUM PHOSPHATE 10 MG/ML IJ SOLN
INTRAMUSCULAR | Status: AC
  Filled 2013-10-27: qty 1

## 2013-10-27 MED ORDER — ONDANSETRON 16 MG/50ML IVPB (CHCC)
INTRAVENOUS | Status: AC
  Filled 2013-10-27: qty 16

## 2013-10-27 MED ORDER — DEXAMETHASONE SODIUM PHOSPHATE 10 MG/ML IJ SOLN
10.0000 mg | Freq: Once | INTRAMUSCULAR | Status: AC
  Administered 2013-10-27: 10 mg via INTRAVENOUS

## 2013-10-27 MED ORDER — ONDANSETRON 16 MG/50ML IVPB (CHCC)
16.0000 mg | Freq: Once | INTRAVENOUS | Status: AC
Start: 2013-10-27 — End: 2013-10-27
  Administered 2013-10-27: 16 mg via INTRAVENOUS

## 2013-10-27 NOTE — Patient Instructions (Signed)
[facility] Discharge Instructions for Patients Receiving Chemotherapy  Today you received the following chemotherapy agents Carboplatin To help prevent nausea and vomiting after your treatment, we encourage you to take your nausea medication as prescribed.  If you develop nausea and vomiting that is not controlled by your nausea medication, call the clinic.   BELOW ARE SYMPTOMS THAT SHOULD BE REPORTED IMMEDIATELY:  *FEVER GREATER THAN 100.5 F  *CHILLS WITH OR WITHOUT FEVER  NAUSEA AND VOMITING THAT IS NOT CONTROLLED WITH YOUR NAUSEA MEDICATION  *UNUSUAL SHORTNESS OF BREATH  *UNUSUAL BRUISING OR BLEEDING  TENDERNESS IN MOUTH AND THROAT WITH OR WITHOUT PRESENCE OF ULCERS  *URINARY PROBLEMS  *BOWEL PROBLEMS  UNUSUAL RASH Items with * indicate a potential emergency and should be followed up as soon as possible.  Feel free to call the clinic you have any questions or concerns. The clinic phone number is [phone].    

## 2013-10-28 LAB — CA 125: CA 125: 1575 U/mL — AB (ref <35)

## 2013-10-31 ENCOUNTER — Other Ambulatory Visit: Payer: Self-pay | Admitting: Oncology

## 2013-11-08 ENCOUNTER — Encounter: Payer: Self-pay | Admitting: Oncology

## 2013-11-08 ENCOUNTER — Other Ambulatory Visit (HOSPITAL_BASED_OUTPATIENT_CLINIC_OR_DEPARTMENT_OTHER): Payer: Medicare Other

## 2013-11-08 ENCOUNTER — Ambulatory Visit (HOSPITAL_BASED_OUTPATIENT_CLINIC_OR_DEPARTMENT_OTHER): Payer: Medicare Other | Admitting: Oncology

## 2013-11-08 ENCOUNTER — Telehealth: Payer: Self-pay | Admitting: *Deleted

## 2013-11-08 ENCOUNTER — Telehealth: Payer: Self-pay | Admitting: Oncology

## 2013-11-08 VITALS — BP 120/70 | HR 85 | Temp 97.8°F | Resp 18 | Ht 59.0 in | Wt 82.7 lb

## 2013-11-08 DIAGNOSIS — C562 Malignant neoplasm of left ovary: Secondary | ICD-10-CM

## 2013-11-08 DIAGNOSIS — C775 Secondary and unspecified malignant neoplasm of intrapelvic lymph nodes: Secondary | ICD-10-CM

## 2013-11-08 DIAGNOSIS — C801 Malignant (primary) neoplasm, unspecified: Secondary | ICD-10-CM

## 2013-11-08 DIAGNOSIS — M810 Age-related osteoporosis without current pathological fracture: Secondary | ICD-10-CM

## 2013-11-08 LAB — CBC WITH DIFFERENTIAL/PLATELET
BASO%: 0.5 % (ref 0.0–2.0)
Basophils Absolute: 0 10*3/uL (ref 0.0–0.1)
EOS%: 2.2 % (ref 0.0–7.0)
Eosinophils Absolute: 0.2 10*3/uL (ref 0.0–0.5)
HCT: 37.3 % (ref 34.8–46.6)
HGB: 12 g/dL (ref 11.6–15.9)
LYMPH#: 1.4 10*3/uL (ref 0.9–3.3)
LYMPH%: 20.1 % (ref 14.0–49.7)
MCH: 31 pg (ref 25.1–34.0)
MCHC: 32.3 g/dL (ref 31.5–36.0)
MCV: 95.8 fL (ref 79.5–101.0)
MONO#: 0.5 10*3/uL (ref 0.1–0.9)
MONO%: 7.3 % (ref 0.0–14.0)
NEUT#: 5 10*3/uL (ref 1.5–6.5)
NEUT%: 69.9 % (ref 38.4–76.8)
Platelets: 273 10*3/uL (ref 145–400)
RBC: 3.89 10*6/uL (ref 3.70–5.45)
RDW: 13.1 % (ref 11.2–14.5)
WBC: 7.1 10*3/uL (ref 3.9–10.3)

## 2013-11-08 LAB — COMPREHENSIVE METABOLIC PANEL (CC13)
ALT: 14 U/L (ref 0–55)
ANION GAP: 9 meq/L (ref 3–11)
AST: 29 U/L (ref 5–34)
Albumin: 3.9 g/dL (ref 3.5–5.0)
Alkaline Phosphatase: 57 U/L (ref 40–150)
BILIRUBIN TOTAL: 0.39 mg/dL (ref 0.20–1.20)
BUN: 14.4 mg/dL (ref 7.0–26.0)
CHLORIDE: 100 meq/L (ref 98–109)
CO2: 29 meq/L (ref 22–29)
Calcium: 9.6 mg/dL (ref 8.4–10.4)
Creatinine: 0.6 mg/dL (ref 0.6–1.1)
Glucose: 115 mg/dl (ref 70–140)
Potassium: 4.2 mEq/L (ref 3.5–5.1)
SODIUM: 138 meq/L (ref 136–145)
Total Protein: 8.2 g/dL (ref 6.4–8.3)

## 2013-11-08 NOTE — Progress Notes (Signed)
OFFICE PROGRESS NOTE   11/08/2013   Physicians:Emma Charlynne Cousins, V.Leschber, M.Magod,_ Irena Reichmann ([place] [phone])   INTERVAL HISTORY:  Patient is seen, together with husband, in continuing attention to chemotherapy in progress for extensive, poorly differentiated gyn carcinoma. She had cycle 2 single agent carboplatin on 10-27-13, AUC = 3.5. She has not needed gCSF.   She continues with discomfort in mid to lower back, worse when she stands and better when she sits and with heating pad. She has not tried 1/2 doses of tramadol q 8 hours as we had discussed last visit, just takes one tablet once daily "when the pain is so bad that I cannot stand it". She feels that she does not tolerate the full 50 mg of tramadol well, tho not able to describe actual side effects. She had diarrhea on 10-3 and 10-4, but normal bowel movement today. She has only minimal occasional nausea with chemo. She has not been particularly more fatigued and has had no bleeding or symptoms of infection.  She does not have PAC.  She will have flu vaccine elsewhere today. She has not had genetics testing.  ONCOLOGIC HISTORY Patient was seen by Dr Lynann Bologna for radicular left lower extremity pain, which had been ongoing for several months. MRI LS 08-23-2013 reportedly showed pelvic mass. She had pelvic US in [facility] system by Dr Lynann Bologna on 08-24-13, with left adnexal mass cystic and solid, with solid area up to 4.5 cm and adjacent cyst up to 4.1 cm and additionally a solid appearing lesion along posterior cervix or lower uterine segment, recognizable ovaries not seen bilaterally and endometrial stripe 2 mm. She saw Dr Denman George in consultation on  MRI pelvis was obtained 09-14-13 to better delineate process, with 5 cm complex cystic and solid mass in left adnexa, multilobular soft tissue masses along left pelvic sidewall, involving sigmoid colon tho do not appear to arise  from colon, radiographically favored to be pelvic metastatic disease over primary sigmoid colon disease. She had CT CAP on 09-23-13 for staging, with extensive emphysema, single nodule along inferior medial margin of liver and disease in pelvis. She had CT biopsy of soft tissue mass in left aspect of pelvic cul-de-sac on 09-27-13, with pathology ([id number]) showing poorly differentiated adenocarcinoma, IHC most consistent with gyn primary. Dr Denman George does not feel that she could tolerate surgery + chemotherapy, or that surgery would be optimal debulking, so has recommended that treatment begin with chemotherapy. It is doubtful that she will be a surgical candidate in future, but this could be considered later depending on response to chemotherapy and overall condition.  Review of systems as above, also: Did not sleep night after either of first 2 chemo treatments. Tolerates small meals several x daily, has not tried to increase supplement (1/2 can Muscle Milk daily). Some cough, nonproductive. No LE swelling. Denies problems with peripheral IV access. No radiation of back pain to LE now. Remainder of 10 point Review of Systems negative.  Objective:  Vital signs in last 24 hours:  BP 120/70  Pulse 85  Temp(Src) 97.8 F (36.6 C) (Oral)  Resp 18  Ht 4\' 11"  (1.499 m)  Wt 82 lb 11.2 oz (37.512 kg)  BMI 16.69 kg/m2  Weight stable.   Alert, oriented and appropriate. Ambulatory without assistance. Respirations not labored RA. Looks frail as previously, but neatly groomed and NAD. No alopecia  HEENT:PERRL, sclerae not icteric. Oral mucosa moist without lesions, posterior pharynx clear.  Neck supple. No JVD.  Lymphatics:no cervical,suraclavicular, axillary, inguinal adenopathy Resp:clear to auscultation bilaterally and normal percussion bilaterally Cardio: regular rate and rhythm. No gallop. GI: soft, nontender, not distended, no mass or organomegaly. Normally active bowel sounds.  Musculoskeletal/  Extremities: without pitting edema, cords, tenderness. Little muscle mass. Neuro: no peripheral neuropathy. Otherwise nonfocal. PSYCH appropriate mood and affect Skin without rash, ecchymosis, petechiae   Lab Results:  Results for orders placed in visit on 11/08/13  CBC WITH DIFFERENTIAL      Result Value Ref Range   WBC 7.1  3.9 - 10.3 10e3/uL   NEUT# 5.0  1.5 - 6.5 10e3/uL   HGB 12.0  11.6 - 15.9 g/dL   HCT 37.3  34.8 - 46.6 %   Platelets 273  145 - 400 10e3/uL   MCV 95.8  79.5 - 101.0 fL   MCH 31.0  25.1 - 34.0 pg   MCHC 32.3  31.5 - 36.0 g/dL   RBC 3.89  3.70 - 5.45 10e6/uL   RDW 13.1  11.2 - 14.5 %   lymph# 1.4  0.9 - 3.3 10e3/uL   MONO# 0.5  0.1 - 0.9 10e3/uL   Eosinophils Absolute 0.2  0.0 - 0.5 10e3/uL   Basophils Absolute 0.0  0.0 - 0.1 10e3/uL   NEUT% 69.9  38.4 - 76.8 %   LYMPH% 20.1  14.0 - 49.7 %   MONO% 7.3  0.0 - 14.0 %   EOS% 2.2  0.0 - 7.0 %   BASO% 0.5  0.0 - 2.0 %  COMPREHENSIVE METABOLIC PANEL (NU27)      Result Value Ref Range   Sodium 138  136 - 145 mEq/L   Potassium 4.2  3.5 - 5.1 mEq/L   Chloride 100  98 - 109 mEq/L   CO2 29  22 - 29 mEq/L   Glucose 115  70 - 140 mg/dl   BUN 14.4  7.0 - 26.0 mg/dL   Creatinine 0.6  0.6 - 1.1 mg/dL   Total Bilirubin 0.39  0.20 - 1.20 mg/dL   Alkaline Phosphatase 57  40 - 150 U/L   AST 29  5 - 34 U/L   ALT 14  0 - 55 U/L   Total Protein 8.2  6.4 - 8.3 g/dL   Albumin 3.9  3.5 - 5.0 g/dL   Calcium 9.6  8.4 - 10.4 mg/dL   Anion Gap 9  3 - 11 mEq/L    Reviewed CA125 results, which had been 3100 on 10-01-13 and was down to 1575 by 10-27-13.  Studies/Results:  No results found.  Medications: I have reviewed the patient's current medications. I do not know how much Ca/D are in the gummy multivitamin  DISCUSSION: patient and husband are pleased with how well she has been able to tolerate very careful single agent carboplatin. Tho marker has improved somewhat, her back pain is no better; from communication with Dr  Denman George in August " Her tumor appears to be symptomatic with infiltration to the bone, and she may be a candidate for palliative radiation for pain or prevention of colonic obstruction". I have recommended that we repeat CT after cycle 3 carboplatin on 11-17-13 (and will repeat CA 125 then).    Assessment/Plan: 1. 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 Single agent carboplatin begun 10-06-13 at AUC =3, tolerated well and marker some lower.  Increased dose slightly for cycle 2 on 9-23, however overall status is too poor for chance of cytopenias or significant GI problems. She will have cycle 3 carboplatin on 11-17-13 as long as [place] >=1.5 and plt >=100k. Will repeat CT AP after cycle 3 particularly to see if radiation might be appropriate for symptoms. I will continue to follow her closely. 2. Emphysema on continuous O2, past tobacco. Several hospitalizations for pulmonary infections in past 2 years. Known to [facility]  3. C difficile colitis 2013- 2014. Still intermittent diarrhea but C diff negative 10-06-13  4.Decreased EF 2014 when she was acutely ill with other problems  5.severe protein calorie malnutrition with C diff, weight loss down to 60 lbs then. PO intake reasonable now per consultation with [place] [facility], still trying to increase supplement.  6.radicular LLE pain: may be from LS orthopedic problems or may be related to left pelvic sidewall involvement with the gyn tumor. Known to Dr Lynann Bologna. In separate communication to me, Dr Denman George mentions possible palliative radiation if needed for pain control or rectal obstruction.  Patient asked to try 1/2 tab tramadol q 8 hrs for now. 7.Advance Directives in place.  8.osteoporosis: on q 6 mo Prolia from Dr Layne Benton, given this month 9. Patient plans  to get flu vaccine elsewhere today. 10.hx diverticulosis 11.for flu vaccine elsewhere today.     Time spent 25 min including >50% counseling and coordination of care. Chemo orders confirmed.  Hazael Olveda P, MD   11/08/2013, 1:57 PM

## 2013-11-08 NOTE — Patient Instructions (Signed)
Try taking tramadol 1/2 tablet every 8 hours. Can increase to full tablet if more discomfort.

## 2013-11-08 NOTE — Telephone Encounter (Signed)
Per staff message and POF I have scheduled appts. Advised scheduler of appts. JMW  

## 2013-11-08 NOTE — Telephone Encounter (Signed)
, °

## 2013-11-17 ENCOUNTER — Ambulatory Visit (HOSPITAL_BASED_OUTPATIENT_CLINIC_OR_DEPARTMENT_OTHER): Payer: Medicare Other

## 2013-11-17 ENCOUNTER — Other Ambulatory Visit (HOSPITAL_BASED_OUTPATIENT_CLINIC_OR_DEPARTMENT_OTHER): Payer: Medicare Other

## 2013-11-17 VITALS — BP 116/60 | HR 93 | Temp 98.2°F

## 2013-11-17 DIAGNOSIS — C801 Malignant (primary) neoplasm, unspecified: Secondary | ICD-10-CM

## 2013-11-17 DIAGNOSIS — C562 Malignant neoplasm of left ovary: Secondary | ICD-10-CM

## 2013-11-17 DIAGNOSIS — Z5111 Encounter for antineoplastic chemotherapy: Secondary | ICD-10-CM

## 2013-11-17 DIAGNOSIS — C787 Secondary malignant neoplasm of liver and intrahepatic bile duct: Secondary | ICD-10-CM

## 2013-11-17 DIAGNOSIS — C775 Secondary and unspecified malignant neoplasm of intrapelvic lymph nodes: Secondary | ICD-10-CM

## 2013-11-17 LAB — CBC WITH DIFFERENTIAL/PLATELET
BASO%: 0.2 % (ref 0.0–2.0)
Basophils Absolute: 0 10*3/uL (ref 0.0–0.1)
EOS%: 2.7 % (ref 0.0–7.0)
Eosinophils Absolute: 0.1 10*3/uL (ref 0.0–0.5)
HCT: 37.5 % (ref 34.8–46.6)
HGB: 12.3 g/dL (ref 11.6–15.9)
LYMPH#: 0.9 10*3/uL (ref 0.9–3.3)
LYMPH%: 17 % (ref 14.0–49.7)
MCH: 31.4 pg (ref 25.1–34.0)
MCHC: 32.7 g/dL (ref 31.5–36.0)
MCV: 96 fL (ref 79.5–101.0)
MONO#: 0.6 10*3/uL (ref 0.1–0.9)
MONO%: 11.6 % (ref 0.0–14.0)
NEUT#: 3.8 10*3/uL (ref 1.5–6.5)
NEUT%: 68.5 % (ref 38.4–76.8)
Platelets: 273 10*3/uL (ref 145–400)
RBC: 3.91 10*6/uL (ref 3.70–5.45)
RDW: 14.1 % (ref 11.2–14.5)
WBC: 5.6 10*3/uL (ref 3.9–10.3)

## 2013-11-17 LAB — COMPREHENSIVE METABOLIC PANEL (CC13)
ALT: 13 U/L (ref 0–55)
ANION GAP: 8 meq/L (ref 3–11)
AST: 27 U/L (ref 5–34)
Albumin: 3.7 g/dL (ref 3.5–5.0)
Alkaline Phosphatase: 63 U/L (ref 40–150)
BUN: 16.2 mg/dL (ref 7.0–26.0)
CALCIUM: 9.7 mg/dL (ref 8.4–10.4)
CHLORIDE: 100 meq/L (ref 98–109)
CO2: 30 meq/L — AB (ref 22–29)
Creatinine: 0.7 mg/dL (ref 0.6–1.1)
Glucose: 89 mg/dl (ref 70–140)
POTASSIUM: 4.1 meq/L (ref 3.5–5.1)
SODIUM: 138 meq/L (ref 136–145)
TOTAL PROTEIN: 8.3 g/dL (ref 6.4–8.3)
Total Bilirubin: 0.37 mg/dL (ref 0.20–1.20)

## 2013-11-17 MED ORDER — SODIUM CHLORIDE 0.9 % IV SOLN
Freq: Once | INTRAVENOUS | Status: AC
  Administered 2013-11-17: 11:00:00 via INTRAVENOUS

## 2013-11-17 MED ORDER — SODIUM CHLORIDE 0.9 % IV SOLN
180.0000 mg | Freq: Once | INTRAVENOUS | Status: AC
  Administered 2013-11-17: 180 mg via INTRAVENOUS
  Filled 2013-11-17: qty 18

## 2013-11-17 MED ORDER — DEXAMETHASONE SODIUM PHOSPHATE 20 MG/5ML IJ SOLN
INTRAMUSCULAR | Status: AC
  Filled 2013-11-17: qty 5

## 2013-11-17 MED ORDER — DEXAMETHASONE SODIUM PHOSPHATE 10 MG/ML IJ SOLN
6.0000 mg | Freq: Once | INTRAMUSCULAR | Status: AC
  Administered 2013-11-17: 6 mg via INTRAVENOUS

## 2013-11-17 MED ORDER — ONDANSETRON 16 MG/50ML IVPB (CHCC)
16.0000 mg | Freq: Once | INTRAVENOUS | Status: AC
  Administered 2013-11-17: 16 mg via INTRAVENOUS

## 2013-11-17 MED ORDER — ONDANSETRON 16 MG/50ML IVPB (CHCC)
INTRAVENOUS | Status: AC
  Filled 2013-11-17: qty 16

## 2013-11-17 NOTE — Patient Instructions (Signed)
[facility] Discharge Instructions for Patients Receiving Chemotherapy  Today you received the following chemotherapy agents Carboplatin To help prevent nausea and vomiting after your treatment, we encourage you to take your nausea medication as prescribed.  If you develop nausea and vomiting that is not controlled by your nausea medication, call the clinic.   BELOW ARE SYMPTOMS THAT SHOULD BE REPORTED IMMEDIATELY:  *FEVER GREATER THAN 100.5 F  *CHILLS WITH OR WITHOUT FEVER  NAUSEA AND VOMITING THAT IS NOT CONTROLLED WITH YOUR NAUSEA MEDICATION  *UNUSUAL SHORTNESS OF BREATH  *UNUSUAL BRUISING OR BLEEDING  TENDERNESS IN MOUTH AND THROAT WITH OR WITHOUT PRESENCE OF ULCERS  *URINARY PROBLEMS  *BOWEL PROBLEMS  UNUSUAL RASH Items with * indicate a potential emergency and should be followed up as soon as possible.  Feel free to call the clinic you have any questions or concerns. The clinic phone number is [phone].    

## 2013-11-18 ENCOUNTER — Telehealth: Payer: Self-pay | Admitting: *Deleted

## 2013-11-18 LAB — CA 125: CA 125: 1297 U/mL — ABNORMAL HIGH (ref <35)

## 2013-11-18 NOTE — Telephone Encounter (Signed)
Called and spoke with pt and let her know results of CA 125 as noted below by Dr. Marko Plume. Pt very appreciative of the call.

## 2013-11-18 NOTE — Telephone Encounter (Signed)
Message copied by Christa See on Thu Nov 18, 2013  4:01 PM ------      Message from: Gordy Levan      Created: Thu Nov 18, 2013  2:38 PM       Labs seen and need follow up please let her know that the ca125 marker is a little better again, now 1297, which is down from 4800 in August. This number is still high, but improved. ------

## 2013-11-19 ENCOUNTER — Other Ambulatory Visit: Payer: Self-pay

## 2013-11-28 ENCOUNTER — Other Ambulatory Visit: Payer: Self-pay | Admitting: Oncology

## 2013-11-29 ENCOUNTER — Ambulatory Visit (HOSPITAL_COMMUNITY)
Admission: RE | Admit: 2013-11-29 | Discharge: 2013-11-29 | Disposition: A | Discharge status: Home / Self Care | Payer: Medicare Other | Source: Ambulatory Visit | Attending: Oncology | Admitting: Oncology

## 2013-11-29 DIAGNOSIS — C786 Secondary malignant neoplasm of retroperitoneum and peritoneum: Secondary | ICD-10-CM | POA: Diagnosis not present

## 2013-11-29 DIAGNOSIS — M4855XA Collapsed vertebra, not elsewhere classified, thoracolumbar region, initial encounter for fracture: Secondary | ICD-10-CM | POA: Insufficient documentation

## 2013-11-29 DIAGNOSIS — C562 Malignant neoplasm of left ovary: Secondary | ICD-10-CM

## 2013-11-29 MED ORDER — IOHEXOL 300 MG/ML  SOLN
80.0000 mL | Freq: Once | INTRAMUSCULAR | Status: AC | PRN
  Administered 2013-11-29: 80 mL via INTRAVENOUS

## 2013-12-01 ENCOUNTER — Telehealth: Payer: Self-pay | Admitting: Oncology

## 2013-12-01 ENCOUNTER — Ambulatory Visit (HOSPITAL_BASED_OUTPATIENT_CLINIC_OR_DEPARTMENT_OTHER): Payer: Medicare Other | Admitting: Oncology

## 2013-12-01 ENCOUNTER — Other Ambulatory Visit (HOSPITAL_BASED_OUTPATIENT_CLINIC_OR_DEPARTMENT_OTHER): Payer: Medicare Other

## 2013-12-01 ENCOUNTER — Encounter: Payer: Self-pay | Admitting: Oncology

## 2013-12-01 VITALS — BP 128/67 | HR 69 | Temp 98.3°F | Resp 20 | Ht 59.0 in | Wt 83.4 lb

## 2013-12-01 DIAGNOSIS — C562 Malignant neoplasm of left ovary: Secondary | ICD-10-CM

## 2013-12-01 LAB — COMPREHENSIVE METABOLIC PANEL (CC13)
ALK PHOS: 64 U/L (ref 40–150)
ALT: 14 U/L (ref 0–55)
AST: 24 U/L (ref 5–34)
Albumin: 3.6 g/dL (ref 3.5–5.0)
Anion Gap: 7 mEq/L (ref 3–11)
BILIRUBIN TOTAL: 0.27 mg/dL (ref 0.20–1.20)
BUN: 11.9 mg/dL (ref 7.0–26.0)
CO2: 31 mEq/L — ABNORMAL HIGH (ref 22–29)
Calcium: 9.6 mg/dL (ref 8.4–10.4)
Chloride: 101 mEq/L (ref 98–109)
Creatinine: 0.6 mg/dL (ref 0.6–1.1)
GLUCOSE: 87 mg/dL (ref 70–140)
Potassium: 4 mEq/L (ref 3.5–5.1)
Sodium: 138 mEq/L (ref 136–145)
Total Protein: 7.6 g/dL (ref 6.4–8.3)

## 2013-12-01 LAB — CBC WITH DIFFERENTIAL/PLATELET
BASO%: 0.4 % (ref 0.0–2.0)
BASOS ABS: 0 10*3/uL (ref 0.0–0.1)
EOS ABS: 0.2 10*3/uL (ref 0.0–0.5)
EOS%: 2.7 % (ref 0.0–7.0)
HCT: 34.1 % — ABNORMAL LOW (ref 34.8–46.6)
HEMOGLOBIN: 10.9 g/dL — AB (ref 11.6–15.9)
LYMPH%: 20.2 % (ref 14.0–49.7)
MCH: 31.1 pg (ref 25.1–34.0)
MCHC: 32 g/dL (ref 31.5–36.0)
MCV: 97.2 fL (ref 79.5–101.0)
MONO#: 0.7 10*3/uL (ref 0.1–0.9)
MONO%: 11.5 % (ref 0.0–14.0)
NEUT%: 65.2 % (ref 38.4–76.8)
NEUTROS ABS: 3.7 10*3/uL (ref 1.5–6.5)
Platelets: 258 10*3/uL (ref 145–400)
RBC: 3.51 10*6/uL — ABNORMAL LOW (ref 3.70–5.45)
RDW: 14.1 % (ref 11.2–14.5)
WBC: 5.6 10*3/uL (ref 3.9–10.3)
lymph#: 1.1 10*3/uL (ref 0.9–3.3)

## 2013-12-01 NOTE — Patient Instructions (Signed)
Dr Lynann Bologna can see you on Monday Nov 2 at 10:30. I would like her to tell us if she thinks your left hip and leg pain is from the cancer in pelvis, or if it is from your other orthopedic problems. If she thinks the pain is from cancer in pelvis, we will ask the radiation oncology doctors to see you.  It is fine for you to take the tramadol 1/2 to one tablet every 8 hours if that helps your discomfort.

## 2013-12-01 NOTE — Telephone Encounter (Signed)
gv pt appt schedule for nov.  °

## 2013-12-01 NOTE — Progress Notes (Signed)
OFFICE PROGRESS NOTE   12/01/2013   Physicians:Emma Charlynne Cousins, V.Leschber, M.Magod,_ Irena Reichmann ([facility] [phone])   INTERVAL HISTORY:  Patient is seen, together with husband and daughter, in continuing attention to advanced, poorly differentiated gyn carcinoma for which she has received 3 cycles of carboplatin thru 11-17-13 and had restaging CT AP 11-29-13 because of increased low back and left hip/ LLE pain. The scan shows some mild decrease in size of pelvic masses without any progressive or new disease; she has stable multiple thoracolumbar compression fractures. CA125 marker was down from 4861 on 10-01-13 to 1297 day of cycle 3 carboplatin 11-17-13.  Patient and family tell me that pain has been more bothersome in past few weeks, worse when she stands or walks but also keeping her from sleeping at times and she is spending more time in bed because of the discomfort. Pain is mostly left hip and radiates to LLE. She has tried 1/2 tramadol twice daily, and has taken a full tramadol tablet at least once, but does not feel this is helpful.  She has cough, mostly NP, which she says is stable and husband describes as increased; she denies increased SOB, on continuous oxygen. Appetite is stable, boweld ok, no fever.    She does not have PAC.   flu vaccine done She has not had genetics testing.   ONCOLOGIC HISTORY Patient was seen by Dr Lynann Bologna for radicular left lower extremity pain, which had been ongoing for several months. MRI LS 08-23-2013 reportedly showed pelvic mass. She had pelvic US in [facility] system by Dr Lynann Bologna on 08-24-13, with left adnexal mass cystic and solid, with solid area up to 4.5 cm and adjacent cyst up to 4.1 cm and additionally a solid appearing lesion along posterior cervix or lower uterine segment, recognizable ovaries not seen bilaterally and endometrial stripe 2 mm. She saw Dr Denman George in consultation on  MRI  pelvis was obtained 09-14-13 to better delineate process, with 5 cm complex cystic and solid mass in left adnexa, multilobular soft tissue masses along left pelvic sidewall, involving sigmoid colon tho do not appear to arise from colon, radiographically favored to be pelvic metastatic disease over primary sigmoid colon disease. She had CT CAP on 09-23-13 for staging, with extensive emphysema, single nodule along inferior medial margin of liver and disease in pelvis. She had CT biopsy of soft tissue mass in left aspect of pelvic cul-de-sac on 09-27-13, with pathology ([id number]) showing poorly differentiated adenocarcinoma, IHC most consistent with gyn primary. Dr Denman George does not feel that she could tolerate surgery + chemotherapy, or that surgery would be optimal debulking, so has recommended that treatment begin with chemotherapy. It is doubtful that she will be a surgical candidate in future, but this could be considered later depending on response to chemotherapy and overall condition. She began chemotherapy with single agent carboplatin 10-06-13, dose reduced due to poor PS.  Review of systems as above, also: Some cough occasionally productive of whitish sputum.  Remainder of 10 point Review of Systems negative.  Objective:  Vital signs in last 24 hours:  BP 128/67  Pulse 69  Temp(Src) 98.3 F (36.8 C) (Oral)  Resp 20  Ht 4\' 11"  (1.499 m)  Wt 83 lb 6.4 oz (37.83 kg)  BMI 16.84 kg/m2  SpO2 90% weight is stable.  Elderly, frail, alert, oriented and appropriate. Ambulatory with assistance. Using portable O2. Hair thin. Respirations not labored, Occasional deep cough.  HEENT:PERRL,  sclerae not icteric. Oral mucosa moist without lesions, posterior pharynx clear.  Neck supple. No JVD.  Lymphatics:no cervical,suraclavicular adenopathy Resp: as above, no dullness to percussion. Diminished BS thruout, no wheezes or rales. Cardio: regular rate and rhythm. No gallop. GI: soft, nontender, not  distended, no mass or organomegaly. Some bowel sounds.  Musculoskeletal/ Extremities: without pitting edema, cords, tenderness Neuro: speech fluent and appropriate, no focal changes. PSYCH appropriate mood and affect Skin without rash, ecchymosis, petechiae   Lab Results:  Results for orders placed in visit on 12/01/13  CBC WITH DIFFERENTIAL      Result Value Ref Range   WBC 5.6  3.9 - 10.3 10e3/uL   NEUT# 3.7  1.5 - 6.5 10e3/uL   HGB 10.9 (*) 11.6 - 15.9 g/dL   HCT 34.1 (*) 34.8 - 46.6 %   Platelets 258  145 - 400 10e3/uL   MCV 97.2  79.5 - 101.0 fL   MCH 31.1  25.1 - 34.0 pg   MCHC 32.0  31.5 - 36.0 g/dL   RBC 3.51 (*) 3.70 - 5.45 10e6/uL   RDW 14.1  11.2 - 14.5 %   lymph# 1.1  0.9 - 3.3 10e3/uL   MONO# 0.7  0.1 - 0.9 10e3/uL   Eosinophils Absolute 0.2  0.0 - 0.5 10e3/uL   Basophils Absolute 0.0  0.0 - 0.1 10e3/uL   NEUT% 65.2  38.4 - 76.8 %   LYMPH% 20.2  14.0 - 49.7 %   MONO% 11.5  0.0 - 14.0 %   EOS% 2.7  0.0 - 7.0 %   BASO% 0.4  0.0 - 2.0 %    CMET WNL except CO2 31, including creat 0.6  CA125 11-17-13 as above.  Studies/Results:  CT ABDOMEN AND PELVIS WITH CONTRAST  TECHNIQUE:  Multidetector CT imaging of the abdomen and pelvis was performed  using the standard protocol following bolus administration of  intravenous contrast.  CONTRAST: 78mL OMNIPAQUE IOHEXOL 300 MG/ML SOLN  COMPARISON: 09/23/13  FINDINGS:  Lower Chest: Unremarkable.  Hepatobiliary: No masses or other significant abnormality  identified.  Pancreas: No mass, inflammatory changes, or other parenchymal  abnormality identified.  Spleen: Within normal limits in size and appearance.  Adrenal Glands: No mass identified.  Kidneys/Urinary Tract: No masses identified. Small right lower pole  cyst remains stable. No evidence of hydronephrosis.  Stomach/Bowel/Peritoneum: No evidence of wall thickening or  obstruction.  Vascular/Lymphatic: No pathologically enlarged lymph nodes  identified. No other  significant abnormality identified.  Reproductive: Mixed solid and cystic mass the left adnexa has  decreased in size, currently measuring 2.6 x 3.1 cm on image 47  compared with 3.3 x 4.2 cm previously. Other areas of peritoneal  soft tissue density in the left adnexa and pelvic cul-de-sac  bilaterally show mild decrease in size. Index density in the right  cul-de-sac measures 1.4 x 1.6 cm on image 57 compared to 1.8 x 2.3  cm previously. Another soft tissue density in the left cul-de-sac  measures 2.8 x 3.4 cm on image 57 compared to 3.2 x 3.8 cm  previously. Previously noted peritoneal nodule along the posterior  margin of the right hepatic lobe is no longer visualized. No  evidence of ascites.  Other: None.  Musculoskeletal: Multiple thoracolumbar vertebral compression  fracture deformities appear stable.  IMPRESSION:  Interval decrease in size of complex cystic and solid left adnexal  mass, and peritoneal metastatic disease. No new or progressive  metastatic disease identified.  PACS images reviewed by MD.  Medications: I have reviewed the patient's current medications. I have encouraged her to try the tramadol 50 mg q 8 hrs for now.  DISCUSSION: results of CT discussed with patient and family; they did not want to see images in PACs. With some improvement in size of pelvic masses, it seems less likely that these are causing recent increase in pain, tho certainly she still has extensive involvement in pelvis. If the left hip pain is cancer related, radiation would be consideration; if the pain is related to other orthopedic problems, it is reasaonable to continue chemotherapy given improvement documented on scan. I have suggested that we ask Dr Lynann Bologna to see her again to review present symptoms and the CT information; we can refer to RT depending on Dr Laurena Bering evaluation. Due to timing of RT start if so, still likely fine to proceed with cycle 4 carboplatin on 12-08-13. We have  discussed all of this at length several times, as patient has some difficulty understanding the recommendations.  Assessment/Plan:  1. 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 Single agent carboplatin begun 10-06-13 at AUC =3, tolerated well and marker some lower. Will proceed with cycle 4 on 12-08-13 if ANC >=1.5 and plt >=100k. If Dr Lynann Bologna feels that left hip/ LLE pain are more likely related to pelvic tumor than other orthopedic problems, will refer to RT. 2. Emphysema on continuous O2, past tobacco. Several hospitalizations for pulmonary infections in past 2 years. Known to [facility]  3. C difficile colitis 2013- 2014. Still intermittent diarrhea but C diff negative 10-06-13  4.Decreased EF 2014 when she was acutely ill with other problems  5.severe protein calorie malnutrition with C diff, weight loss down to 60 lbs then. PO intake reasonable now per consultation with [facility] dietician, using supplement.  6.radicular LLE pain: may be from LS orthopedic problems or may be related to left pelvic sidewall involvement with the gyn tumor. Known to Dr Lynann Bologna. I have spoken directly with her office now, appointment scheduled for 12-06-13 and confirmed that she is able to review CT on EPIC/[facility]. 7.Advance Directives in place.  8.osteoporosis: on q 6 mo Prolia from Dr Layne Benton, given this month  9. flu vaccine done  10.hx diverticulosis     I will see her back with labs ~ 2 weeks after cycle 4. By completion of visit, patient and family seemed to understand situation and were in agreement with plan. TIme spent 25 min including >50% counseling and coordination of care. Chemo orders confirmed.    LIVESAY,LENNIS P, MD   12/01/2013, 1:09 PM

## 2013-12-02 ENCOUNTER — Telehealth: Payer: Self-pay

## 2013-12-02 NOTE — Telephone Encounter (Signed)
Told Bianca [name] that Dr. Marko Plume would like for her to keep appointment with Dr. Lynann Bologna on 12-06-13 as the information as to whether or not the pain is from other orthopedic problems or cancer in pelvis would help Dr. Marko Plume to take care of her going forward.  [name] said she would keep appointment but was hot happy to do so.

## 2013-12-02 NOTE — Telephone Encounter (Signed)
Bianca [name] called stating that the pain in her left hip and leg that she discussed yesterday with Dr. Marko Plume is completely gone this morning.  She wants to cancel her appointment with Dr. Lynann Bologna. She has not taken any pain medication either.  She really rather not  keep appointment as going out really wears her out. Told [name] that this information would be reviewed with Dr. Marko Plume.

## 2013-12-08 ENCOUNTER — Ambulatory Visit (HOSPITAL_BASED_OUTPATIENT_CLINIC_OR_DEPARTMENT_OTHER): Payer: Medicare Other

## 2013-12-08 ENCOUNTER — Other Ambulatory Visit (HOSPITAL_BASED_OUTPATIENT_CLINIC_OR_DEPARTMENT_OTHER): Payer: Medicare Other

## 2013-12-08 DIAGNOSIS — C562 Malignant neoplasm of left ovary: Secondary | ICD-10-CM

## 2013-12-08 DIAGNOSIS — C801 Malignant (primary) neoplasm, unspecified: Secondary | ICD-10-CM

## 2013-12-08 DIAGNOSIS — Z5111 Encounter for antineoplastic chemotherapy: Secondary | ICD-10-CM

## 2013-12-08 LAB — CBC WITH DIFFERENTIAL/PLATELET
BASO%: 0.3 % (ref 0.0–2.0)
Basophils Absolute: 0 10*3/uL (ref 0.0–0.1)
EOS%: 1.5 % (ref 0.0–7.0)
Eosinophils Absolute: 0.1 10*3/uL (ref 0.0–0.5)
HEMATOCRIT: 34.8 % (ref 34.8–46.6)
HGB: 11.3 g/dL — ABNORMAL LOW (ref 11.6–15.9)
LYMPH#: 0.9 10*3/uL (ref 0.9–3.3)
LYMPH%: 14.1 % (ref 14.0–49.7)
MCH: 31.3 pg (ref 25.1–34.0)
MCHC: 32.4 g/dL (ref 31.5–36.0)
MCV: 96.8 fL (ref 79.5–101.0)
MONO#: 0.6 10*3/uL (ref 0.1–0.9)
MONO%: 9.3 % (ref 0.0–14.0)
NEUT#: 4.9 10*3/uL (ref 1.5–6.5)
NEUT%: 74.8 % (ref 38.4–76.8)
Platelets: 354 10*3/uL (ref 145–400)
RBC: 3.6 10*6/uL — ABNORMAL LOW (ref 3.70–5.45)
RDW: 14.8 % — AB (ref 11.2–14.5)
WBC: 6.6 10*3/uL (ref 3.9–10.3)

## 2013-12-08 LAB — BASIC METABOLIC PANEL (CC13)
Anion Gap: 12 mEq/L — ABNORMAL HIGH (ref 3–11)
BUN: 15.1 mg/dL (ref 7.0–26.0)
CHLORIDE: 99 meq/L (ref 98–109)
CO2: 25 meq/L (ref 22–29)
Calcium: 9.7 mg/dL (ref 8.4–10.4)
Creatinine: 0.6 mg/dL (ref 0.6–1.1)
Glucose: 102 mg/dl (ref 70–140)
POTASSIUM: 4.4 meq/L (ref 3.5–5.1)
Sodium: 135 mEq/L — ABNORMAL LOW (ref 136–145)

## 2013-12-08 MED ORDER — DEXAMETHASONE SODIUM PHOSPHATE 10 MG/ML IJ SOLN
6.0000 mg | Freq: Once | INTRAMUSCULAR | Status: AC
  Administered 2013-12-08: 6 mg via INTRAVENOUS

## 2013-12-08 MED ORDER — DEXAMETHASONE SODIUM PHOSPHATE 10 MG/ML IJ SOLN
INTRAMUSCULAR | Status: AC
  Filled 2013-12-08: qty 1

## 2013-12-08 MED ORDER — ONDANSETRON 16 MG/50ML IVPB (CHCC)
INTRAVENOUS | Status: AC
  Filled 2013-12-08: qty 16

## 2013-12-08 MED ORDER — SODIUM CHLORIDE 0.9 % IV SOLN
180.0000 mg | Freq: Once | INTRAVENOUS | Status: AC
  Administered 2013-12-08: 180 mg via INTRAVENOUS
  Filled 2013-12-08: qty 18

## 2013-12-08 MED ORDER — ONDANSETRON 16 MG/50ML IVPB (CHCC)
16.0000 mg | Freq: Once | INTRAVENOUS | Status: AC
  Administered 2013-12-08: 16 mg via INTRAVENOUS

## 2013-12-08 MED ORDER — SODIUM CHLORIDE 0.9 % IV SOLN
Freq: Once | INTRAVENOUS | Status: AC
  Administered 2013-12-08: 10:00:00 via INTRAVENOUS

## 2013-12-08 NOTE — Patient Instructions (Signed)
[facility] Discharge Instructions for Patients Receiving Chemotherapy  Today you received the following chemotherapy agents Carboplatin  To help prevent nausea and vomiting after your treatment, we encourage you to take your nausea medication as needed   If you develop nausea and vomiting that is not controlled by your nausea medication, call the clinic.   BELOW ARE SYMPTOMS THAT SHOULD BE REPORTED IMMEDIATELY:  *FEVER GREATER THAN 100.5 F  *CHILLS WITH OR WITHOUT FEVER  NAUSEA AND VOMITING THAT IS NOT CONTROLLED WITH YOUR NAUSEA MEDICATION  *UNUSUAL SHORTNESS OF BREATH  *UNUSUAL BRUISING OR BLEEDING  TENDERNESS IN MOUTH AND THROAT WITH OR WITHOUT PRESENCE OF ULCERS  *URINARY PROBLEMS  *BOWEL PROBLEMS  UNUSUAL RASH Items with * indicate a potential emergency and should be followed up as soon as possible.  Feel free to call the clinic you have any questions or concerns. The clinic phone number is [phone].

## 2013-12-09 LAB — CA 125: CA 125: 1510 U/mL — ABNORMAL HIGH (ref <35)

## 2013-12-19 ENCOUNTER — Other Ambulatory Visit: Payer: Self-pay | Admitting: Oncology

## 2013-12-22 ENCOUNTER — Ambulatory Visit (HOSPITAL_BASED_OUTPATIENT_CLINIC_OR_DEPARTMENT_OTHER): Payer: Medicare Other | Admitting: Oncology

## 2013-12-22 ENCOUNTER — Encounter: Payer: Self-pay | Admitting: Oncology

## 2013-12-22 ENCOUNTER — Other Ambulatory Visit (HOSPITAL_BASED_OUTPATIENT_CLINIC_OR_DEPARTMENT_OTHER): Payer: Medicare Other

## 2013-12-22 VITALS — BP 115/64 | HR 90 | Temp 98.5°F | Resp 18 | Ht 59.0 in | Wt 81.7 lb

## 2013-12-22 DIAGNOSIS — C801 Malignant (primary) neoplasm, unspecified: Secondary | ICD-10-CM

## 2013-12-22 DIAGNOSIS — R102 Pelvic and perineal pain: Secondary | ICD-10-CM

## 2013-12-22 DIAGNOSIS — C787 Secondary malignant neoplasm of liver and intrahepatic bile duct: Secondary | ICD-10-CM

## 2013-12-22 DIAGNOSIS — R300 Dysuria: Secondary | ICD-10-CM

## 2013-12-22 DIAGNOSIS — C775 Secondary and unspecified malignant neoplasm of intrapelvic lymph nodes: Secondary | ICD-10-CM

## 2013-12-22 DIAGNOSIS — C562 Malignant neoplasm of left ovary: Secondary | ICD-10-CM

## 2013-12-22 LAB — COMPREHENSIVE METABOLIC PANEL (CC13)
ALK PHOS: 62 U/L (ref 40–150)
ALT: 10 U/L (ref 0–55)
AST: 26 U/L (ref 5–34)
Albumin: 3.9 g/dL (ref 3.5–5.0)
Anion Gap: 12 mEq/L — ABNORMAL HIGH (ref 3–11)
BUN: 17.4 mg/dL (ref 7.0–26.0)
CO2: 28 mEq/L (ref 22–29)
Calcium: 9.6 mg/dL (ref 8.4–10.4)
Chloride: 98 mEq/L (ref 98–109)
Creatinine: 0.6 mg/dL (ref 0.6–1.1)
Glucose: 103 mg/dl (ref 70–140)
POTASSIUM: 4.3 meq/L (ref 3.5–5.1)
SODIUM: 138 meq/L (ref 136–145)
TOTAL PROTEIN: 7.9 g/dL (ref 6.4–8.3)
Total Bilirubin: 0.5 mg/dL (ref 0.20–1.20)

## 2013-12-22 LAB — URINALYSIS, MICROSCOPIC - CHCC
BLOOD: NEGATIVE
Bilirubin (Urine): NEGATIVE
Glucose: NEGATIVE mg/dL
Ketones: NEGATIVE mg/dL
Nitrite: NEGATIVE
PH: 6 (ref 4.6–8.0)
PROTEIN: 30 mg/dL
Specific Gravity, Urine: 1.03 (ref 1.003–1.035)
Urobilinogen, UR: 0.2 mg/dL (ref 0.2–1)

## 2013-12-22 LAB — CBC WITH DIFFERENTIAL/PLATELET
BASO%: 0.2 % (ref 0.0–2.0)
Basophils Absolute: 0 10*3/uL (ref 0.0–0.1)
EOS ABS: 0 10*3/uL (ref 0.0–0.5)
EOS%: 0.7 % (ref 0.0–7.0)
HCT: 33.5 % — ABNORMAL LOW (ref 34.8–46.6)
HGB: 10.8 g/dL — ABNORMAL LOW (ref 11.6–15.9)
LYMPH#: 0.9 10*3/uL (ref 0.9–3.3)
LYMPH%: 15.4 % (ref 14.0–49.7)
MCH: 31.9 pg (ref 25.1–34.0)
MCHC: 32.2 g/dL (ref 31.5–36.0)
MCV: 98.8 fL (ref 79.5–101.0)
MONO#: 0.8 10*3/uL (ref 0.1–0.9)
MONO%: 12.5 % (ref 0.0–14.0)
NEUT%: 71.2 % (ref 38.4–76.8)
NEUTROS ABS: 4.3 10*3/uL (ref 1.5–6.5)
Platelets: 233 10*3/uL (ref 145–400)
RBC: 3.39 10*6/uL — AB (ref 3.70–5.45)
RDW: 14.9 % — AB (ref 11.2–14.5)
WBC: 6 10*3/uL (ref 3.9–10.3)

## 2013-12-22 MED ORDER — CIPROFLOXACIN HCL 250 MG PO TABS: 250.0000 mg | ORAL_TABLET | Freq: Two times a day (BID) | ORAL | Status: DC

## 2013-12-22 NOTE — Progress Notes (Signed)
OFFICE PROGRESS NOTE   12/22/2013   Physicians:Emma Charlynne Cousins, M.Magod,_ Irena Reichmann (PCP, [place])  INTERVAL HISTORY:  Patient is seen, together with daughter, in continuing attention to advanced, poorly differentiated gyn carcinoma for which she has had 4 cycles of single agent carboplatin from 10-06-2013 thru 12-08-2013. Patient and daughter tell me that she has felt worse this week, tho history from the patient is difficult. Last CT AP was 11-29-13, which showed slight decrease in size of adnexal and pelvic tumor since beginning chemotherapy.  Patient has been more weak overall at least in past week, now with difficulty moving room to room at home, despite long term continuous home O2. She complains of sensations of being hot and dizzy beginning 12-21-13 and at least 4 episodes by afternoon appointment today. She denies associated chest pain, palpitations, nausea, increased SOB with those epidodes, and does not lose consciousness. She complains of "stomach pain" but indicates vaginal or pelvic location of the discomfort when she demonstrates location of pain. She does not know if she has had fever, does report burning with urination. She denies increased cough. She drinks mostly caffeinated liquids, discussed. She denies diarrhea. She mentions new pain left buttock when stands. She has rolling walker at home, but is not using that regularly, not easy for her to get to different areas of house.   Patient and daughter are vague about which MD is primary, possibly Dr Melford Aase, as patient tells me that she refuses to see previous primary MD; she does not have any appointments scheduled with Dr Melford Aase. They do not know which MD manages her home O2. Daughter very supportive.  She does not have PAC.  flu vaccine done She has not had genetics testing.  ONCOLOGIC HISTORY Patient was seen by Dr Lynann Bologna for radicular left lower  extremity pain, which had been ongoing for several months. MRI LS 08-23-2013 reportedly showed pelvic mass. She had pelvic US in [facility] system by Dr Lynann Bologna on 08-24-13, with left adnexal mass cystic and solid, with solid area up to 4.5 cm and adjacent cyst up to 4.1 cm and additionally a solid appearing lesion along posterior cervix or lower uterine segment, recognizable ovaries not seen bilaterally and endometrial stripe 2 mm. She saw Dr Denman George in consultation on  MRI pelvis was obtained 09-14-13 to better delineate process, with 5 cm complex cystic and solid mass in left adnexa, multilobular soft tissue masses along left pelvic sidewall, involving sigmoid colon tho do not appear to arise from colon, radiographically favored to be pelvic metastatic disease over primary sigmoid colon disease. She had CT CAP on 09-23-13 for staging, with extensive emphysema, single nodule along inferior medial margin of liver and disease in pelvis. She had CT biopsy of soft tissue mass in left aspect of pelvic cul-de-sac on 09-27-13, with pathology ([id number]) showing poorly differentiated adenocarcinoma, IHC most consistent with gyn primary. Dr Denman George does not feel that she could tolerate surgery + chemotherapy, or that surgery would be optimal debulking, so has recommended that treatment begin with chemotherapy. It is doubtful that she will be a surgical candidate in future, but this could be considered later depending on response to chemotherapy and overall condition. She began chemotherapy with single agent carboplatin 10-06-13, dose reduced due to poor PS.  Review of systems as above, also: Denies shaking chills. Very fatigued coming to this office today. No diarrhea. No vomiting. No bleeding.  Remainder of 10 point Review  of Systems negative.  Objective:  Vital signs in last 24 hours:  BP 115/64 mmHg  Pulse 90  Temp(Src) 98.5 F (36.9 C) (Oral)  Resp 18  Ht 4\' 11"  (1.499 m)  Wt 81 lb 11.2 oz (37.059 kg)  BMI 16.49  kg/m2 weight is down 1 lb. Frail elderly lady, not using her portable O2 in exam room. She looks more uncomfortable but is not in acute distress.  Alert, oriented, using WC tho did walk short distance to BR with RN, tremulous when back in exam room. No alopecia  HEENT:PERRL, sclerae not icteric. Oral mucosa somewhat dry without lesions, posterior pharynx clear.  Neck supple. No JVD.  Lymphatics:no cervical,suraclavicular adenopathy Resp: diminished BS thruout, no wheezes or rales, no use of accessory muscles. No dullness to percussion. Cardio: regular rate and rhythm. No gallop. GI: abdomen soft, nontender, not distended, no appreciable mass or organomegaly on exam done in chair. Some bowel sounds.  Musculoskeletal/ Extremities: without pitting edema, cords, tenderness. Very diminished muscle mass. Neuro: speech fluent, moves all extremities, no focal deficits Skin without rash, ecchymosis, petechiae  Lab Results:  Results for orders placed or performed in visit on 12/22/13  CBC with Differential  Result Value Ref Range   WBC 6.0 3.9 - 10.3 10e3/uL   NEUT# 4.3 1.5 - 6.5 10e3/uL   HGB 10.8 (L) 11.6 - 15.9 g/dL   HCT 33.5 (L) 34.8 - 46.6 %   Platelets 233 145 - 400 10e3/uL   MCV 98.8 79.5 - 101.0 fL   MCH 31.9 25.1 - 34.0 pg   MCHC 32.2 31.5 - 36.0 g/dL   RBC 3.39 (L) 3.70 - 5.45 10e6/uL   RDW 14.9 (H) 11.2 - 14.5 %   lymph# 0.9 0.9 - 3.3 10e3/uL   MONO# 0.8 0.1 - 0.9 10e3/uL   Eosinophils Absolute 0.0 0.0 - 0.5 10e3/uL   Basophils Absolute 0.0 0.0 - 0.1 10e3/uL   NEUT% 71.2 38.4 - 76.8 %   LYMPH% 15.4 14.0 - 49.7 %   MONO% 12.5 0.0 - 14.0 %   EOS% 0.7 0.0 - 7.0 %   BASO% 0.2 0.0 - 2.0 %  Comprehensive metabolic panel (Cmet) - [facility]  Result Value Ref Range   Sodium 138 136 - 145 mEq/L   Potassium 4.3 3.5 - 5.1 mEq/L   Chloride 98 98 - 109 mEq/L   CO2 28 22 - 29 mEq/L   Glucose 103 70 - 140 mg/dl   BUN 17.4 7.0 - 26.0 mg/dL   Creatinine 0.6 0.6 - 1.1 mg/dL   Total  Bilirubin 0.50 0.20 - 1.20 mg/dL   Alkaline Phosphatase 62 40 - 150 U/L   AST 26 5 - 34 U/L   ALT 10 0 - 55 U/L   Total Protein 7.9 6.4 - 8.3 g/dL   Albumin 3.9 3.5 - 5.0 g/dL   Calcium 9.6 8.4 - 10.4 mg/dL   Anion Gap 12 (H) 3 - 11 mEq/L   After drinking fluids here, patient was able to void small amounts x2 for UA, C&S: UA available while patient still at office: moderate leukocyte esterase, 7-10 WBC, few bacteria. SpGr 1.030 Urine culture sent  Studies/Results:  No results found.  Medications: I have reviewed the patient's current medications.  DISCUSSION: I have told patient and daughter that she may have UTI, which could be causing at least some of present problems, tho this is not entirely clear. She is reluctant to begin antibiotics due to previous problems with C diff,  but seems to agree to start limited course while we watch for urine culture results. They understand that she needs to be seen if symptoms continue or worsen, including fever or shaking chills.  I have explained that present problems needs to improve before any consideration of further chemo. Patient states that she feels chemotherapy is too hard for her to continue and that she likely wants to stop this. As patient also demanding to leave, we did not go into further discussion now, but will address when I see her back. Daughter questions RT as they are leaving, not discussed.  Daughter plans to follow up with concerns about the walker at home.  Assessment/Plan:  1. 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 Single agent carboplatin has been given x 4, with some minimal response by CT after cycle 3. Changing to more palliative approach likely best now. Will discuss by phone if needed or at next appointment. 2.vague  general symptoms this week,  possibly UTI related: cipro 250 mg bid #6 given as we watch urine culture pending 3. Emphysema on continuous O2, past tobacco. Several hospitalizations for pulmonary infections in past 2 years. Known to [facility]  4. C difficile colitis 2013- 2014. Still intermittent diarrhea but C diff negative 10-06-13  5.Decreased EF 2014 when she was acutely ill with other problems  6.severe protein calorie malnutrition with C diff, weight loss down to 60 lbs then. She has no reserves with present weight 81 lbs. She is still doing some supplements. 6.radicular LLE pain: may be from LS orthopedic problems or may be related to left pelvic sidewall involvement with the gyn tumor. Known to Dr Lynann Bologna. Patient was to have seen Dr Lynann Bologna in early Nov, tho she was reluctant to do that and I have not received information from Dr Lynann Bologna. I believe radiation could be very difficult for her to tolerate. RN to request note from Dr Lynann Bologna if she has been seen again recently. 7.Advance Directives in place.  8.osteoporosis: on q 6 mo Prolia from Dr Layne Benton, given recently 9. flu vaccine done  10.hx diverticulosis    From my standpoint, she would be appropriate for Hospice if chemo stopped. I did not talk with her about this today Time spent 35 min including >50% counseling and coordination of care.  Lucillie Kiesel P, MD   12/22/2013, 2:53 PM

## 2013-12-23 ENCOUNTER — Telehealth: Payer: Self-pay

## 2013-12-23 ENCOUNTER — Telehealth: Payer: Self-pay | Admitting: Oncology

## 2013-12-23 NOTE — Telephone Encounter (Signed)
Received office note from Dr. Simonne Come office dated 12-06-13 as requested below by Dr. Marko Plume. Note placed on Dr. Mariana Kaufman desk for review and a copy sent to HIM to be scanned onto patient's EMR.

## 2013-12-23 NOTE — Telephone Encounter (Signed)
-----   Message from Gordy Levan, MD sent at 12/23/2013  8:46 AM EST ----- RN please call Dr Cleda Clarks office. If patient seen there in last 3 weeks, please get copy of the note.  thanks

## 2013-12-23 NOTE — Telephone Encounter (Signed)
s.w. pt and advised on Dec appt....ok and aware

## 2013-12-24 ENCOUNTER — Telehealth: Payer: Self-pay

## 2013-12-24 LAB — URINE CULTURE

## 2013-12-24 NOTE — Telephone Encounter (Signed)
Told Ms. Bianca [name] that the urine culture did not show any urinary tract infection.  She can stop the cipro per Dr. Marko Plume.  She has 1 dose left. Ms. Bianca [name] verbalized understanding. Told her that Dr. Marko Plume suggested that she see her PCP Dr. Melford Aase with the symptoms of  general fatigue/weakness continuing.  She has not had any more episodes of being hot and dizzy as noted at 12-22-13 visit with Dr. Marko Plume. Ms. Bianca [name] refused to call Dr. Melford Aase as "he will just pump me with more medicine".

## 2014-01-01 ENCOUNTER — Other Ambulatory Visit: Payer: Self-pay | Admitting: Oncology

## 2014-01-06 ENCOUNTER — Other Ambulatory Visit: Payer: Self-pay

## 2014-01-06 ENCOUNTER — Telehealth: Payer: Self-pay

## 2014-01-06 DIAGNOSIS — C562 Malignant neoplasm of left ovary: Secondary | ICD-10-CM

## 2014-01-06 NOTE — Telephone Encounter (Signed)
Told Bianca [name] that Dr. Marko Plume recomented continuing heating pad or moist heart if possible. She can use the tramadol 50-100 mg q 4-6 hrs as needed. She can try 1 aleve or ibuprofen with something on her stomach, then every 6-8 hrs if helpful. Offered for her to come in today asd be seen by a NP.  Bianca [name] declined.  She will keep appointment with Dr. Marko Plume tomorrow as scheduled. Dr. Marko Plume said that the pain is probably not related to her cancer diagnosis so if the recommendations are not helpful she may need to see her PCP.  Bianca [name] verbalized  understanding.

## 2014-01-06 NOTE — Telephone Encounter (Signed)
Bianca [name] stating that she began with severe pain from top of head to her neck on the right sidethis am ~600.  She took 50 mg of tramadol ~730 and another 50 mg 1030.   Pain 9-10.   It is painfull to move neck left / right and up and down.  No pain in arms or jaw.  She would like a stronger pain medicine.   She does not feel she needs to go to the hospital. She has applied heat and aspercream to neck and head. Has an appointment with Bianca [name] tomorrow at 1400.

## 2014-01-07 ENCOUNTER — Other Ambulatory Visit (HOSPITAL_BASED_OUTPATIENT_CLINIC_OR_DEPARTMENT_OTHER): Payer: Medicare Other

## 2014-01-07 ENCOUNTER — Telehealth: Payer: Self-pay | Admitting: Oncology

## 2014-01-07 ENCOUNTER — Encounter: Payer: Self-pay | Admitting: Oncology

## 2014-01-07 ENCOUNTER — Ambulatory Visit (HOSPITAL_BASED_OUTPATIENT_CLINIC_OR_DEPARTMENT_OTHER): Payer: Medicare Other | Admitting: Oncology

## 2014-01-07 VITALS — BP 117/88 | HR 101 | Temp 99.6°F | Resp 18 | Ht 59.0 in | Wt 81.8 lb

## 2014-01-07 DIAGNOSIS — E43 Unspecified severe protein-calorie malnutrition: Secondary | ICD-10-CM

## 2014-01-07 DIAGNOSIS — C562 Malignant neoplasm of left ovary: Secondary | ICD-10-CM

## 2014-01-07 DIAGNOSIS — M810 Age-related osteoporosis without current pathological fracture: Secondary | ICD-10-CM

## 2014-01-07 LAB — CBC WITH DIFFERENTIAL/PLATELET
BASO%: 0.2 % (ref 0.0–2.0)
Basophils Absolute: 0 10*3/uL (ref 0.0–0.1)
EOS ABS: 0.1 10*3/uL (ref 0.0–0.5)
EOS%: 1 % (ref 0.0–7.0)
HCT: 35.1 % (ref 34.8–46.6)
HGB: 11.4 g/dL — ABNORMAL LOW (ref 11.6–15.9)
LYMPH#: 1.1 10*3/uL (ref 0.9–3.3)
LYMPH%: 13.2 % — ABNORMAL LOW (ref 14.0–49.7)
MCH: 32.5 pg (ref 25.1–34.0)
MCHC: 32.5 g/dL (ref 31.5–36.0)
MCV: 100 fL (ref 79.5–101.0)
MONO#: 0.8 10*3/uL (ref 0.1–0.9)
MONO%: 10.2 % (ref 0.0–14.0)
NEUT%: 75.4 % (ref 38.4–76.8)
NEUTROS ABS: 6.2 10*3/uL (ref 1.5–6.5)
NRBC: 0 % (ref 0–0)
Platelets: 226 10*3/uL (ref 145–400)
RBC: 3.51 10*6/uL — AB (ref 3.70–5.45)
RDW: 15.7 % — ABNORMAL HIGH (ref 11.2–14.5)
WBC: 8.2 10*3/uL (ref 3.9–10.3)

## 2014-01-07 LAB — COMPREHENSIVE METABOLIC PANEL (CC13)
ALBUMIN: 3.6 g/dL (ref 3.5–5.0)
ALT: 13 U/L (ref 0–55)
AST: 28 U/L (ref 5–34)
Alkaline Phosphatase: 71 U/L (ref 40–150)
Anion Gap: 10 mEq/L (ref 3–11)
BUN: 20.1 mg/dL (ref 7.0–26.0)
CALCIUM: 9.9 mg/dL (ref 8.4–10.4)
CHLORIDE: 97 meq/L — AB (ref 98–109)
CO2: 29 mEq/L (ref 22–29)
Creatinine: 0.7 mg/dL (ref 0.6–1.1)
EGFR: 83 mL/min/{1.73_m2} — ABNORMAL LOW (ref >90)
Glucose: 123 mg/dl (ref 70–140)
Potassium: 4.1 mEq/L (ref 3.5–5.1)
Sodium: 136 mEq/L (ref 136–145)
Total Bilirubin: 0.41 mg/dL (ref 0.20–1.20)
Total Protein: 7.9 g/dL (ref 6.4–8.3)

## 2014-01-07 MED ORDER — TAMOXIFEN CITRATE 20 MG PO TABS: 20.0000 mg | ORAL_TABLET | Freq: Every day | ORAL | Status: DC

## 2014-01-07 NOTE — Patient Instructions (Signed)
You can use tramadol 50 - 100 mg every 8 hours.  We did not give you a prescription for hydrocodone because we think you do not tolerate codeine  You can use aleve or advil or ibuprofen 2-3 times daily, take with food  Heating pad and aspercreme are fine  Call if you have questions or concerns about the tamoxifen (hormone blocker tablet)

## 2014-01-07 NOTE — Progress Notes (Signed)
OFFICE PROGRESS NOTE   01/07/2014   Physicians:Emma Charlynne Cousins, V.Leschber, M.Magod,_ Irena Reichmann ([place] [phone])  Patient arrived at office over an hour prior to scheduled time, and was seen by MD ~ 50 min prior to scheduled time of visit.  INTERVAL HISTORY:  Patient is seen, together with husband and adult daughter, in continuing attention to poorly differentiated gyn carcinoma involving pelvis. She is not a surgical candidate due to extent of disease and significant comorbidities, and has had only slight response to single agent carboplatin, having had 4 cycles from 10-06-13 thru 12-08-13. The carboplatin dose was reduced to AUC = 3.5 due to poor PS and other comorbid conditions, which she did tolerate without any major problems.   Since last treatment 12-08-13 she has had some different problems not clearly defined, but has not wanted to have other evaluations done. Urine culture 12-22-13 had no growth. Particular problem for last 24 hours has been acute pain posterior neck to top of head, not improved with tramadol 50 mg and possibly NSAID, has used heading pad and topical cream. She denies trauma, discomfort worse when she lies down and when she turns head. She has extensive degenerative arthritis including in cervical spine by prior imaging in this EMR (including Cspine 01-03-2013). Daughter is in tears describing patient's pain; patient refuses more imaging or evaluation by orthopedics, reports intolerance to codeine derivatives. She has not wanted to see PCP. She denies pelvic pain or any bleeding. She again reports that she has been in continuous pain x years from back. She is eating 5 small meals daily, has intermittent NP cough, denies increase in chronic SOB on continuous O2. Bowels are moving. She is minimally ambulatory using rolling walker at home.   No PAC Flu vaccine done No genetics testing.   ONCOLOGIC  HISTORY Patient was seen by Dr Lynann Bologna for radicular left lower extremity pain, which had been ongoing for several months. MRI LS 08-23-2013 reportedly showed pelvic mass. She had pelvic US in [facility] system by Dr Lynann Bologna on 08-24-13, with left adnexal mass cystic and solid, with solid area up to 4.5 cm and adjacent cyst up to 4.1 cm and additionally a solid appearing lesion along posterior cervix or lower uterine segment, recognizable ovaries not seen bilaterally and endometrial stripe 2 mm. She saw Dr Denman George in consultation on  MRI pelvis was obtained 09-14-13 to better delineate process, with 5 cm complex cystic and solid mass in left adnexa, multilobular soft tissue masses along left pelvic sidewall, involving sigmoid colon tho do not appear to arise from colon, radiographically favored to be pelvic metastatic disease over primary sigmoid colon disease. She had CT CAP on 09-23-13 for staging, with extensive emphysema, single nodule along inferior medial margin of liver and disease in pelvis. She had CT biopsy of soft tissue mass in left aspect of pelvic cul-de-sac on 09-27-13, with pathology ([id number]) showing poorly differentiated adenocarcinoma, IHC most consistent with gyn primary. Dr Denman George does not feel that she could tolerate surgery + chemotherapy, or that surgery would be optimal debulking, so has recommended that treatment begin with chemotherapy. It is doubtful that she will be a surgical candidate in future, but this could be considered later depending on response to chemotherapy and overall condition. She began chemotherapy with single agent carboplatin 10-06-13, dose reduced due to poor PS. CT AP after 3 cycles showed slight improvement in some of the involved areas. She had a fourth cycle  of carboplatin on 12-08-13. CA 125 was 1575 in Sept, 1297 in Oct and 1510 in Nov.  Review of systems as above, also: No fever. Denies chills or dizziness recently. No complaints of diarrhea  Remainder of 10 point Review  of Systems negative.  Patient tells me that she and husband do not need any help at home; daughters have both told me that parents do not allow them to help. Note husband has very limited mobility, including difficulty getting up from chair with assistance now and difficulty walking in office.  Objective:  Vital signs in last 24 hours:  BP 117/88 mmHg  Pulse 101  Temp(Src) 99.6 F (37.6 C) (Oral)  Resp 18  Ht '4\' 11"'  (1.499 m)  Wt 81 lb 12.8 oz (37.104 kg)  BMI 16.51 kg/m2  SpO2 95% Weight is stable. Frail- appearing elderly lady, thin. Does not appear in acute discomfort. Respirations slightly labored on O2. Alert, oriented, conversant. In wheelchair with portable O2. Coughing x 1 during my evaluation. No alopecia Exam done seated in WC. HEENT:PERRL, sclerae not icteric. Oral mucosa moist without lesions  Neck tender along posterior strap muscles R>L.  No JVD.  Lymphatics:no cervical,supraclavicular adenopathy Resp: clear to auscultation bilaterally and normal percussion bilaterally Cardio: regular rate and rhythm. No gallop. GI: soft, nontender, not distended. Some bowel sounds. Musculoskeletal/ Extremities: without pitting edema, cords, tenderness, almost no muscle mass Neuro: speech fluent, moves all extremities Skin without rash on neck or scalp, tho has slightly erythematous birthmark at base of scalp posteriorly, otherwise without ecchymosis, petechiae   Lab Results:  Results for orders placed or performed in visit on 01/07/14  Comprehensive metabolic panel (Cmet) - [facility]  Result Value Ref Range   Sodium 136 136 - 145 mEq/L   Potassium 4.1 3.5 - 5.1 mEq/L   Chloride 97 (L) 98 - 109 mEq/L   CO2 29 22 - 29 mEq/L   Glucose 123 70 - 140 mg/dl   BUN 20.1 7.0 - 26.0 mg/dL   Creatinine 0.7 0.6 - 1.1 mg/dL   Total Bilirubin 0.41 0.20 - 1.20 mg/dL   Alkaline Phosphatase 71 40 - 150 U/L   AST 28 5 - 34 U/L   ALT 13 0 - 55 U/L   Total Protein 7.9 6.4 - 8.3 g/dL   Albumin 3.6  3.5 - 5.0 g/dL   Calcium 9.9 8.4 - 10.4 mg/dL   Anion Gap 10 3 - 11 mEq/L   EGFR 83 (L) >90 ml/min/1.73 m2   CBC with WBC 8.2, Hgb 11.4, plt 226 CA 125 available after visit 0160  Studies/Results:  No results found.  Medications: I have reviewed the patient's current medications. See below. Begin tamoxifen 20 mg once daily.   DISCUSSION I have explained that chemotherapy cannot be aggressive due to overall health status and that the chemotherapy that she has been able to receive has not given enough benefit to attempt this further. I have explained that, in absence of pelvic pain or bleeding, I do not think radiation would be beneficial, as this also can have significant side effects. I have explained again that she is not a surgical candidate and that we do not have treatment that will cure her cancer. I have tried to address quality of life as a priority; patient agrees that frequent trips to this office for chemotherapy and follow up are difficult for her. Husband reminds me that they have been married >60 years and that he and his wife have traveled extensively, which  they enjoyed. Daughter left room in tears during this discussion; I have talked with her and husband separately later. I have offered trial on tamoxifen, which hopefully she can tolerate more easily than chemotherapy, this as original path was strongly ER +; I have explained that tamoxifen will not be curative but may slow disease progression somewhat. Patient and family are in favor of trying tamoxifen. I have also mentioned Hospice, which daughter agrees likely would be helpful (when I talked with her separately from patient).  Patient states "the children were not expecting this", meaning her daughters.  Daughter requesting stronger pain medication, but was not aware of intolerance to codeine derivatives, and I am not comfortable treating apparent orthopedic pain with stronger narcotics given rest of situation. Daughter  requesting muscle relaxants, which I have explained may cause excessive somnolence and again that these symptoms do not seem related to the oncologic situation.  Tamoxifen information discussed.   Patient has asked about follow up at this office, which I have told her I am still glad to do even in absence of chemotherapy, and certainly would need to do if I treat her with tamoxifen. She seems in agreement with scheduling another appointment in follow up of the tamoxifen.  Patient also demands that this visit conclude. I have spoken separately to daughter and husband at that point.  Assessment/Plan:  1. 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 Single agent, dose reduced carboplatin has been given x 4, with some minimal response by CT after cycle 3 and rising marker now. She wants tamoxifen rather than no treatment as next step, and I will continue to discuss Hospice with them as possible 2.neck pain likely related to degenerative arthritis in C spine. She agrees to continuing heating pad, aspercreme, possibly NSAID, possibly tramadol. Dr Lynann Bologna knows her well. 3. Emphysema on continuous O2, past tobacco. Several hospitalizations for pulmonary infections in past 2 years. Known to [facility]  4. C difficile colitis 2013- 2014. Still intermittent diarrhea but C diff negative 10-06-13  5.Decreased EF 2014 when she was acutely ill with other problems  6.severe protein calorie malnutrition with C diff, weight loss down to 60 lbs then. She has no reserves with present weight 81 lbs. She is still doing some supplements. 6.radicular LLE pain: may be from LS orthopedic problems or may be related to left pelvic sidewall involvement with the gyn tumor. She is not complaining of this today 7.Advance Directives in  place.  8.osteoporosis: on q 6 mo Prolia from Dr Layne Benton, given recently 78. flu vaccine done  10.hx diverticulosis    Time spent 35 min including >50% counseling and coordination of care. Patient or family can call prior to next scheduled visit if needed.  Cc this note to Dr Beverlyn Roux, MD   01/07/2014, 2:04 PM

## 2014-01-07 NOTE — Telephone Encounter (Signed)
, °

## 2014-01-08 LAB — CA 125: CA 125: 1777 U/mL — ABNORMAL HIGH (ref <35)

## 2014-01-10 ENCOUNTER — Telehealth: Payer: Self-pay

## 2014-01-10 NOTE — Telephone Encounter (Signed)
Daughter Ms. Lawing was calling to find out why Dr. Marko Plume did not offer radiation forher mother's cancer at visit. 01-07-14.   Reviewed office note from 01-07-14 and told Ms. Lawing that Dr. Marko Plume said that she did not feel that the radiation would not be beneficial and that the side effects would be significant. The side effects of  aggressive treatments would decrease her quality of life given her  mother's commodities, performance status, and advanced disease.   The daughter appreciated the information.

## 2014-01-11 ENCOUNTER — Telehealth: Payer: Self-pay | Admitting: *Deleted

## 2014-01-11 NOTE — Telephone Encounter (Signed)
Spoke with patient's daughter, Page, as noted below. Page agreeable for family to come in for appt on 12/18 at 2pm for further discussion. Page states she will call us back to cancel the appt if they get questions answered tomorrow at pt's appointment with Dr. Lynann Bologna (01/12/14 at 10:30am). Last two MD notes and last two CT scans faxed to Dr. Lynann Bologna at [phone].

## 2014-01-11 NOTE — Telephone Encounter (Signed)
-----   Message from Gordy Levan, MD sent at 01/11/2014 11:12 AM EST ----- I am not sure if Page is the daughter who was with her at visit last week, or the other daughter. Please tell her that the last scan did not show involvement in liver; on prior scan they had seen a nodule in peritoneal lining at surface of liver. The cancer that we can see on scans in primarily in pelvis.  Radiation can have side effects such as diarrhea, and I could not tell from patient's description that pelvic pain or bleeding are problems, but if patient and family want [place] to set up consultation visit with radiation oncology, that would be fine. When Dr Denman George saw patient shortly after diagnosis, she had mentioned considering radiation if pelvic pain worsened.  Please offer family and/or patient another visit with me on 12-18 if they want to talk about situation again - 45 min if so. If patient does not want to come but family does, that is ok with me.  Please send my note to Dr Lynann Bologna. I'm glad patient has agreed to see her now. thanks ----- Message -----    From: Christa See, RN    Sent: 01/11/2014  10:24 AM      To: Gordy Levan, MD  Patient's daughter, Page, called this morning wanting clarification on pt's metastsis (??). She spoke with Barbaraann Share yesterday (her phone note is in [facility] and was asking about radiation. Didn't know how to address Page - she said no one was aware about the spread of cancer to the liver (??). I told her I would have to call her back.   Pt is seeing Isidor Holts tomorrow and daughter requesting I fax your last office note to her as well. Please advise what to say to Page - she was a little upset on the phone. Thanks, [employer]

## 2014-01-20 ENCOUNTER — Encounter: Payer: Self-pay | Admitting: *Deleted

## 2014-01-21 ENCOUNTER — Ambulatory Visit: Payer: Medicare Other | Admitting: Oncology

## 2014-01-21 ENCOUNTER — Telehealth: Payer: Self-pay | Admitting: *Deleted

## 2014-01-21 NOTE — Telephone Encounter (Signed)
Call from pt's daughter requesting appt with Dr. Benay Spice. Pt is scheduled to see Dr. Marko Plume 12/30. Called pt, she states Dr. Marko Plume offered a second opinion. Pt would like visit with Dr. Benay Spice for second opinion after speaking with her family.

## 2014-01-27 ENCOUNTER — Telehealth: Payer: Self-pay | Admitting: Oncology

## 2014-01-27 ENCOUNTER — Other Ambulatory Visit: Payer: Self-pay | Admitting: *Deleted

## 2014-01-27 ENCOUNTER — Telehealth: Payer: Self-pay | Admitting: *Deleted

## 2014-01-27 DIAGNOSIS — C562 Malignant neoplasm of left ovary: Secondary | ICD-10-CM

## 2014-01-27 MED ORDER — TRAMADOL HCL 50 MG PO TABS: ORAL_TABLET | ORAL | Status: DC

## 2014-01-27 NOTE — Telephone Encounter (Signed)
S/W PATIENT HUSBAND AND GAVE NP APPT FOR 01/14 @ 2 W/DR. SHERRILL

## 2014-01-27 NOTE — Telephone Encounter (Signed)
11:58 Completed call from daughter Bianca [name] who is in [place], [place].  "Mom is in a lot of pain.  She hurts everywhere, her back, neck and arms.  I'm calling to get some help for them.  Dad is disabled and can't see to drive her there for evaluation."  Reviewed chart and tramadol instructions.  Bianca [name] thinks mom is only taking one and will instruct her to take two and she will need a refill.  Instructed to call Pharmacy as med is "Historical". 12:30 completed call with spouse who would not allow me to talk to Bianca [name] stating "yesterday her voice failed.  She's in bed and since yesterday getting progressively worse."  Advised to go to the ER where she can be evaluated and treated.  "Not without her permission.  We have 25 people coming to our home for the Holidays.  She can't talk and I can't explain it.  She's got to make it through the [facility].  Pharmacy can't reach Bianca [name] to refill so if you could, get Bianca [name] to refill the Tramadol."  Advised that these are all reasons she needs to go to the ER to be evaluated as this is where things can be done to help.        1252 Bianca [name] notified of today's calls.  Verbal order received and read back from Bianca [name] to refill Tramadol, instruct to go to the ER and emphasize F/U on 02-02-2014 and to notify daughter of these instructions.  Tramadol order called in.   1254 Daughter Bianca [name] has been notified of refill and instructions to go to the ER.  Let her know her father's viewpoint.  Bianca [name] will call her Dad.

## 2014-01-30 ENCOUNTER — Other Ambulatory Visit: Payer: Self-pay | Admitting: Oncology

## 2014-01-30 DIAGNOSIS — C562 Malignant neoplasm of left ovary: Secondary | ICD-10-CM

## 2014-02-02 ENCOUNTER — Ambulatory Visit: Payer: Medicare Other | Admitting: Oncology

## 2014-02-02 ENCOUNTER — Other Ambulatory Visit: Payer: Medicare Other

## 2014-02-03 ENCOUNTER — Ambulatory Visit (INDEPENDENT_AMBULATORY_CARE_PROVIDER_SITE_OTHER): Payer: Medicare Other

## 2014-02-03 ENCOUNTER — Ambulatory Visit (INDEPENDENT_AMBULATORY_CARE_PROVIDER_SITE_OTHER): Payer: Medicare Other | Admitting: Family Medicine

## 2014-02-03 VITALS — BP 112/84 | HR 91 | Temp 98.4°F | Resp 18 | Ht <= 58 in | Wt 82.2 lb

## 2014-02-03 DIAGNOSIS — R0602 Shortness of breath: Secondary | ICD-10-CM

## 2014-02-03 DIAGNOSIS — J189 Pneumonia, unspecified organism: Secondary | ICD-10-CM

## 2014-02-03 DIAGNOSIS — J441 Chronic obstructive pulmonary disease with (acute) exacerbation: Secondary | ICD-10-CM

## 2014-02-03 LAB — POCT CBC
Granulocyte percent: 77.9 %G (ref 37–80)
HCT, POC: 38.3 % (ref 37.7–47.9)
HEMOGLOBIN: 11.9 g/dL — AB (ref 12.2–16.2)
Lymph, poc: 2 (ref 0.6–3.4)
MCH: 32.2 pg — AB (ref 27–31.2)
MCHC: 31.1 g/dL — AB (ref 31.8–35.4)
MCV: 103.5 fL — AB (ref 80–97)
MID (cbc): 0.6 (ref 0–0.9)
MPV: 7.3 fL (ref 0–99.8)
POC Granulocyte: 9.1 — AB (ref 2–6.9)
POC LYMPH PERCENT: 17.1 %L (ref 10–50)
POC MID %: 5 % (ref 0–12)
Platelet Count, POC: 272 10*3/uL (ref 142–424)
RBC: 3.7 M/uL — AB (ref 4.04–5.48)
RDW, POC: 17 %
WBC: 11.7 10*3/uL — AB (ref 4.6–10.2)

## 2014-02-03 LAB — POCT SEDIMENTATION RATE: POCT SED RATE: 76 mm/hr — AB (ref 0–22)

## 2014-02-03 LAB — GLUCOSE, POCT (MANUAL RESULT ENTRY): POC Glucose: 123 mg/dl — AB (ref 70–99)

## 2014-02-03 MED ORDER — ALBUTEROL SULFATE (2.5 MG/3ML) 0.083% IN NEBU: 2.5000 mg | INHALATION_SOLUTION | Freq: Four times a day (QID) | RESPIRATORY_TRACT | Status: DC | PRN

## 2014-02-03 MED ORDER — PREDNISONE 20 MG PO TABS: 40.0000 mg | ORAL_TABLET | Freq: Every day | ORAL | Status: DC

## 2014-02-03 MED ORDER — LEVOFLOXACIN 500 MG PO TABS: 500.0000 mg | ORAL_TABLET | Freq: Every day | ORAL | Status: DC

## 2014-02-03 MED ORDER — IPRATROPIUM BROMIDE 0.02 % IN SOLN
0.5000 mg | Freq: Once | RESPIRATORY_TRACT | Status: AC
  Administered 2014-02-03: 0.5 mg via RESPIRATORY_TRACT

## 2014-02-03 MED ORDER — ALBUTEROL SULFATE (2.5 MG/3ML) 0.083% IN NEBU
2.5000 mg | INHALATION_SOLUTION | Freq: Once | RESPIRATORY_TRACT | Status: AC
  Administered 2014-02-03: 2.5 mg via RESPIRATORY_TRACT

## 2014-02-03 MED ORDER — METHYLPREDNISOLONE SODIUM SUCC 125 MG IJ SOLR
125.0000 mg | Freq: Once | INTRAMUSCULAR | Status: AC
  Administered 2014-02-03: 125 mg via INTRAMUSCULAR

## 2014-02-03 NOTE — Patient Instructions (Addendum)
Start taking your albuterol nebulizer four times a day - can use additional if needed.  Take your spiriva every day.  Pick up the levaquin and start a dose today.  Take the prednisone tomorrow morning. Please come back into clnic tomorrow morning (by 9 a.m.) so we can recheck you oxygen level.  You can use the tramadol as needed to suppress your cough.  Chronic Obstructive Pulmonary Disease Chronic obstructive pulmonary disease (COPD) is a common lung condition in which airflow from the lungs is limited. COPD is a general term that can be used to describe many different lung problems that limit airflow, including both chronic bronchitis and emphysema. If you have COPD, your lung function will probably never return to normal, but there are measures you can take to improve lung function and make yourself feel better.  CAUSES   Smoking (common).   Exposure to secondhand smoke.   Genetic problems.  Chronic inflammatory lung diseases or recurrent infections. SYMPTOMS   Shortness of breath, especially with physical activity.   Deep, persistent (chronic) cough with a large amount of thick mucus.   Wheezing.   Rapid breaths (tachypnea).   Gray or bluish discoloration (cyanosis) of the skin, especially in fingers, toes, or lips.   Fatigue.   Weight loss.   Frequent infections or episodes when breathing symptoms become much worse (exacerbations).   Chest tightness. DIAGNOSIS  Your health care provider will take a medical history and perform a physical examination to make the initial diagnosis. Additional tests for COPD may include:   Lung (pulmonary) function tests.  Chest X-ray.  CT scan.  Blood tests. TREATMENT  Treatment available to help you feel better when you have COPD includes:   Inhaler and nebulizer medicines. These help manage the symptoms of COPD and make your breathing more comfortable.  Supplemental oxygen. Supplemental oxygen is only helpful if you have  a low oxygen level in your blood.   Exercise and physical activity. These are beneficial for nearly all people with COPD. Some people may also benefit from a pulmonary rehabilitation program. HOME CARE INSTRUCTIONS   Take all medicines (inhaled or pills) as directed by your health care provider.  Avoid over-the-counter medicines or cough syrups that dry up your airway (such as antihistamines) and slow down the elimination of secretions unless instructed otherwise by your health care provider.   If you are a smoker, the most important thing that you can do is stop smoking. Continuing to smoke will cause further lung damage and breathing trouble. Ask your health care provider for help with quitting smoking. He or she can direct you to community resources or hospitals that provide support.  Avoid exposure to irritants such as smoke, chemicals, and fumes that aggravate your breathing.  Use oxygen therapy and pulmonary rehabilitation if directed by your health care provider. If you require home oxygen therapy, ask your health care provider whether you should purchase a pulse oximeter to measure your oxygen level at home.   Avoid contact with individuals who have a contagious illness.  Avoid extreme temperature and humidity changes.  Eat healthy foods. Eating smaller, more frequent meals and resting before meals may help you maintain your strength.  Stay active, but balance activity with periods of rest. Exercise and physical activity will help you maintain your ability to do things you want to do.  Preventing infection and hospitalization is very important when you have COPD. Make sure to receive all the vaccines your health care provider recommends, especially  the pneumococcal and influenza vaccines. Ask your health care provider whether you need a pneumonia vaccine.  Learn and use relaxation techniques to manage stress.  Learn and use controlled breathing techniques as directed by your  health care provider. Controlled breathing techniques include:   Pursed lip breathing. Start by breathing in (inhaling) through your nose for 1 second. Then, purse your lips as if you were going to whistle and breathe out (exhale) through the pursed lips for 2 seconds.   Diaphragmatic breathing. Start by putting one hand on your abdomen just above your waist. Inhale slowly through your nose. The hand on your abdomen should move out. Then purse your lips and exhale slowly. You should be able to feel the hand on your abdomen moving in as you exhale.   Learn and use controlled coughing to clear mucus from your lungs. Controlled coughing is a series of short, progressive coughs. The steps of controlled coughing are:  1. Lean your head slightly forward.  2. Breathe in deeply using diaphragmatic breathing.  3. Try to hold your breath for 3 seconds.  4. Keep your mouth slightly open while coughing twice.  5. Spit any mucus out into a tissue.  6. Rest and repeat the steps once or twice as needed. SEEK MEDICAL CARE IF:   You are coughing up more mucus than usual.   There is a change in the color or thickness of your mucus.   Your breathing is more labored than usual.   Your breathing is faster than usual.  SEEK IMMEDIATE MEDICAL CARE IF:   You have shortness of breath while you are resting.   You have shortness of breath that prevents you from:  Being able to talk.   Performing your usual physical activities.   You have chest pain lasting longer than 5 minutes.   Your skin color is more cyanotic than usual.  You measure low oxygen saturations for longer than 5 minutes with a pulse oximeter. MAKE SURE YOU:   Understand these instructions.  Will watch your condition.  Will get help right away if you are not doing well or get worse. Document Released: 10/31/2004 Document Revised: 06/07/2013 Document Reviewed: 09/17/2012 [facility] Patient Information 2015 [place],  [place]. This information is not intended to replace advice given to you by your health care provider. Make sure you discuss any questions you have with your health care provider.

## 2014-02-03 NOTE — Progress Notes (Addendum)
This chart was scribed for Bianca Knapp, MD by Edison Simon, ED Scribe. This patient was seen in room 5 and the patient's care was started at 12:30 PM.   Subjective:    Patient ID: Bianca [name], female    DOB: 1930/04/08, 80 y.o.   MRN: [id number]  Chief Complaint  Patient presents with  . Cough    x2-3 weeks, semi-productive cough yellow phlegm.     HPI  HPI Comments: Bianca [name] is a 80 y.o. female with Hx of COPD who presents to the [facility] complaining of cough with onset a few weeks ago. Of note, she had low O2 sat here. She states she in on 3L O2 all the time at home. She reports SOB. She states her cough produces yellow mucous but no blood. Yesterday, she used Spiriva, Vicks vapor rub, Nyquil, and humidifier. She states she is prescribed Spiriva as needed. She states she has a nebulizer at home which she has not used recently. She does not have any other inhalers. She states she has COPD from bronchitis rather than smoking. She is a former smoker. Her pulmonologist is Dr. Melvyn Novas. She notes prior C. Diff infection. She currently has ovarian cancer but states she was taken off chemotherapy 3 weeks ago because it was ineffective. She is using Tamoxifen. She states Tramadol is controlling her pain well. Records indicate she last had chest CT in August, [name] function and liver function were were good when checked 3 weeks ago, and blood count was fair 3 weeks ago. She denies fever, chills, or sleep disturbance.    Past Medical History  Diagnosis Date  . DIVERTICULOSIS, COLON   . VITAMIN D DEFICIENCY   . HYPERLIPIDEMIA   . HYPERTENSION   . OSTEOPOROSIS   . COPD (chronic obstructive pulmonary disease)     PFTs 02/10/12: mild-mod  . Cancer     ovarian ca     Medication List       This list is accurate as of: 02/03/14 12:30 PM.  Always use your most recent med list.               denosumab 60 MG/ML Soln injection  Commonly known as:  PROLIA  Inject 60  mg into the skin every 6 (six) months. Administer in upper arm, thigh, or abdomen. Dr Kathrin Penner office     ibuprofen 200 MG tablet  Commonly known as:  ADVIL,MOTRIN  Take 200 mg by mouth every 6 (six) hours as needed.     loperamide 2 MG tablet  Commonly known as:  IMODIUM A-D  Take 2 mg by mouth as needed for diarrhea or loose stools.     LORazepam 0.5 MG tablet  Commonly known as:  ATIVAN  Take one half tablet (0.25 mg) by mouth or under the tongue every six hours as needed for nausea.     losartan 50 MG tablet  Commonly known as:  COZAAR  Take 25 mg by mouth every morning.     MULTI-VITAMIN GUMMIES Chew  Chew 1 tablet by mouth daily.     ondansetron 8 MG tablet  Commonly known as:  ZOFRAN  Take one tablet every 8 hours as needed for nausea. Take 1 the morning after chemo whether or not nauseated.     tamoxifen 20 MG tablet  Commonly known as:  NOLVADEX  Take 1 tablet (20 mg total) by mouth daily.     tiotropium 18 MCG inhalation capsule  Commonly known as:  SPIRIVA  Place 18 mcg into inhaler and inhale daily as needed (Shortness of breath).     traMADol 50 MG tablet  Commonly known as:  ULTRAM  Take 1 to 2 tablets every 8 hours as needed for moderate pain     trolamine salicylate 10 % cream  Commonly known as:  ASPERCREME  Apply 1 application topically as needed for muscle pain.       Allergies  Allergen Reactions  . Aspirin Nausea And Vomiting  . Codeine Hives  . Penicillins Swelling  . Shrimp [Shellfish Allergy] Swelling  . Tomato     Swelling, hives     Review of Systems  Constitutional: Negative for fever and chills.  Respiratory: Positive for cough and shortness of breath.   Allergic/Immunologic: Positive for immunocompromised state.  Psychiatric/Behavioral: Negative for sleep disturbance.       Objective:   Physical Exam  Constitutional: She is oriented to person, place, and time.  Elderly  HENT:  Head: Normocephalic and atraumatic.  Eyes:  Conjunctivae are normal.  Neck: Normal range of motion. Neck supple.  Cardiovascular: Normal rate, regular rhythm and normal heart sounds.   No murmur heard. Pulmonary/Chest: Effort normal. No respiratory distress. She has decreased breath sounds (throughout). She has no wheezes. She has rhonchi (diffuse inspiratory). She has no rales.  Musculoskeletal: Normal range of motion.  Neurological: She is alert and oriented to person, place, and time.  Skin: Skin is warm and dry.  Psychiatric: She has a normal mood and affect.  Nursing note and vitals reviewed.    Filed Vitals:   02/03/14 1208 02/03/14 1307  BP: 112/84   Pulse: 91   Temp: 98.4 F (36.9 C)   TempSrc: Oral   Resp: 18   Height: 4' 8.5" (1.435 m)   Weight: 82 lb 3.2 oz (37.286 kg)   SpO2: 81% 93%   Results for orders placed or performed in visit on 02/03/14  POCT CBC  Result Value Ref Range   WBC 11.7 (A) 4.6 - 10.2 K/uL   Lymph, poc 2.0 0.6 - 3.4   POC LYMPH PERCENT 17.1 10 - 50 %L   MID (cbc) 0.6 0 - 0.9   POC MID % 5.0 0 - 12 %M   POC Granulocyte 9.1 (A) 2 - 6.9   Granulocyte percent 77.9 37 - 80 %G   RBC 3.70 (A) 4.04 - 5.48 M/uL   Hemoglobin 11.9 (A) 12.2 - 16.2 g/dL   HCT, POC 38.3 37.7 - 47.9 %   MCV 103.5 (A) 80 - 97 fL   MCH, POC 32.2 (A) 27 - 31.2 pg   MCHC 31.1 (A) 31.8 - 35.4 g/dL   RDW, POC 17.0 %   Platelet Count, POC 272 142 - 424 K/uL   MPV 7.3 0 - 99.8 fL  POCT glucose (manual entry)  Result Value Ref Range   POC Glucose 123 (A) 70 - 99 mg/dl   Lab Results  Component Value Date   POCTSEDRATE 76* 02/03/2014    [facility] (PRIMARY) x-ray report read by Dr. Brigitte Pulse. Chest x-ray. Severe emphysematous change with small right lower lobe infiltrate. Trachea appears slightly shifted towards right. Diaphram depressed but no effusion. Densities seem unlateral, presumed to be in vertebrae and not significantly changed from prior.    Dg Chest 2 View  02/03/2014   CLINICAL DATA:  Shortness of breath; history  of COPD.  EXAM: CHEST  2 VIEW  COMPARISON:  PA and lateral  chest x-ray of January 03, 2013  FINDINGS: The lungs remain hyperinflated with hemidiaphragm flattening. The pulmonary interstitial markings are coarse at the right lung base but stable. The heart and pulmonary vascularity are normal. The mediastinum is normal in width. There is mild tortuosity of the descending thoracic aorta. There is old deformity of the right and left ribs laterally. The patient has undergone previous kyphoplasty of T10 and T12.  IMPRESSION: COPD. There is no evidence of pneumonia nor CHF or other acute cardiopulmonary abnormality.   Electronically Signed   By: David  Martinique   On: 02/03/2014 14:43    Assessment & Plan:   Discussed plan to treat with antibiotics and breathing treatments at home to address O2 saturation. Will also administer duoneb here. Will keep oxygen flow at 3L/min since that is what she uses at home.  O2 sat increased to 89-90% after breathing treatment. Will increased oxygen flow from 3L to 4L. Re-evaluated patient.   Discussed with patient and family that x-ray shows evidence of pneumonia.  SOB (shortness of breath) - Plan: albuterol (PROVENTIL) (2.5 MG/3ML) 0.083% nebulizer solution 2.5 mg, ipratropium (ATROVENT) nebulizer solution 0.5 mg, POCT CBC, POCT glucose (manual entry), POCT SEDIMENTATION RATE, DG Chest 2 View, methylPREDNISolone sodium succinate (SOLU-MEDROL) 125 mg/2 mL injection 125 mg  COPD exacerbation - solumedrol 125 IM x 1 in office tonight.  Advised pt that I would recommend o/n hosp for this acute illness due to significantly decreased O2 sats but pt refuses - she is considering going on hospice as her ovarian cancer is no longer being treated and she is adament that she wants to spend as much time at home as she can - will do anything to avoid hosp.   Start levaquin 500 qd x 7d today and prednisone 40mg  qd x 5d tomorrow. Start spiriva qam with alb neb qid scheduled. Take tramadol  prn for cough suppression (has at home from oncology). Increase  Home O2 to 6L overnight as after duoneb and increased oxygen pt's O2 sat improved from 87% to 92% at rest. Pt has an O2 monitor at home so she will cont to check and call 911 if remains <88% at rest.  Recheck in office tomorrow. If any worse o/n, call 911. Pt understands and agrees.  CAP (community acquired pneumonia)  Meds ordered this encounter  Medications  . albuterol (PROVENTIL) (2.5 MG/3ML) 0.083% nebulizer solution 2.5 mg    Sig:   . ipratropium (ATROVENT) nebulizer solution 0.5 mg    Sig:   . methylPREDNISolone sodium succinate (SOLU-MEDROL) 125 mg/2 mL injection 125 mg    Sig:   . levofloxacin (LEVAQUIN) 500 MG tablet    Sig: Take 1 tablet (500 mg total) by mouth daily.    Dispense:  7 tablet    Refill:  0  . albuterol (PROVENTIL) (2.5 MG/3ML) 0.083% nebulizer solution    Sig: Take 3 mLs (2.5 mg total) by nebulization every 6 (six) hours as needed for wheezing or shortness of breath.    Dispense:  150 mL    Refill:  1  . predniSONE (DELTASONE) 20 MG tablet    Sig: Take 2 tablets (40 mg total) by mouth daily with breakfast.    Dispense:  10 tablet    Refill:  0    I personally performed the services described in this documentation, which was scribed in my presence. The recorded information has been reviewed and considered, and addended by me as needed.  Delman Cheadle, MD MPH

## 2014-02-04 ENCOUNTER — Ambulatory Visit (INDEPENDENT_AMBULATORY_CARE_PROVIDER_SITE_OTHER): Payer: Medicare Other | Admitting: Family Medicine

## 2014-02-04 VITALS — BP 124/76 | HR 97 | Temp 98.1°F | Resp 18 | Ht <= 58 in | Wt 82.0 lb

## 2014-02-04 DIAGNOSIS — J441 Chronic obstructive pulmonary disease with (acute) exacerbation: Secondary | ICD-10-CM

## 2014-02-04 DIAGNOSIS — C562 Malignant neoplasm of left ovary: Secondary | ICD-10-CM

## 2014-02-04 DIAGNOSIS — J189 Pneumonia, unspecified organism: Secondary | ICD-10-CM

## 2014-02-04 MED ORDER — TIOTROPIUM BROMIDE MONOHYDRATE 18 MCG IN CAPS: 18.0000 ug | ORAL_CAPSULE | Freq: Every day | RESPIRATORY_TRACT | Status: DC | PRN

## 2014-02-04 NOTE — Patient Instructions (Signed)
I will see you at 9:15 on Friday January 8th at the [facility] next door to the urgent care - the "appointment center." at [address]. Continue your spiriva indefinitely.  I would encourage you to continue using your albuterol nebulizer four times a day but if you tend to have symptoms of to much albuterol with jitteriness or racing heart or tremor you can spread it out to 3 times a day. Crank up your oxygen to 6 L during the day when you are up and walking around for the next 2d but keep it at 4L at night.  You can gradually go down on your oxygen to your prior 3L as long as your oxygen saturations are keeping around 94-96% at rest and above 92% with activity.  Below 90 is to low and if you are getting that regularly please come back in.

## 2014-02-11 ENCOUNTER — Ambulatory Visit (INDEPENDENT_AMBULATORY_CARE_PROVIDER_SITE_OTHER): Payer: Medicare Other | Admitting: Family Medicine

## 2014-02-11 ENCOUNTER — Encounter: Payer: Self-pay | Admitting: Family Medicine

## 2014-02-11 VITALS — BP 100/72 | HR 99 | Temp 98.5°F | Resp 16 | Ht <= 58 in | Wt 82.6 lb

## 2014-02-11 DIAGNOSIS — J441 Chronic obstructive pulmonary disease with (acute) exacerbation: Secondary | ICD-10-CM

## 2014-02-11 DIAGNOSIS — M542 Cervicalgia: Secondary | ICD-10-CM

## 2014-02-11 DIAGNOSIS — Z79899 Other long term (current) drug therapy: Secondary | ICD-10-CM

## 2014-02-11 DIAGNOSIS — C562 Malignant neoplasm of left ovary: Secondary | ICD-10-CM

## 2014-02-11 LAB — COMPREHENSIVE METABOLIC PANEL
ALBUMIN: 3.8 g/dL (ref 3.5–5.2)
AST: 21 U/L (ref 0–37)
Alkaline Phosphatase: 40 U/L (ref 39–117)
BUN: 19 mg/dL (ref 6–23)
CO2: 29 mEq/L (ref 19–32)
Calcium: 8.9 mg/dL (ref 8.4–10.5)
Chloride: 96 mEq/L (ref 96–112)
Creat: 0.46 mg/dL — ABNORMAL LOW (ref 0.50–1.10)
GLUCOSE: 81 mg/dL (ref 70–99)
Potassium: 4.3 mEq/L (ref 3.5–5.3)
Sodium: 135 mEq/L (ref 135–145)
Total Bilirubin: 0.4 mg/dL (ref 0.2–1.2)
Total Protein: 7.2 g/dL (ref 6.0–8.3)

## 2014-02-11 LAB — POCT CBC
Granulocyte percent: 62.8 %G (ref 37–80)
HEMATOCRIT: 36.7 % — AB (ref 37.7–47.9)
Hemoglobin: 12 g/dL — AB (ref 12.2–16.2)
Lymph, poc: 2.4 (ref 0.6–3.4)
MCH, POC: 32.8 pg — AB (ref 27–31.2)
MCHC: 32.6 g/dL (ref 31.8–35.4)
MCV: 100.7 fL — AB (ref 80–97)
MID (CBC): 0.1 (ref 0–0.9)
MPV: 6.9 fL (ref 0–99.8)
POC Granulocyte: 4.3 (ref 2–6.9)
POC LYMPH PERCENT: 35.5 %L (ref 10–50)
POC MID %: 1.7 %M (ref 0–12)
Platelet Count, POC: 101 10*3/uL — AB (ref 142–424)
RBC: 3.65 M/uL — AB (ref 4.04–5.48)
RDW, POC: 15.4 %
WBC: 6.9 10*3/uL (ref 4.6–10.2)

## 2014-02-11 NOTE — Patient Instructions (Signed)
Stop your losartan.  I don't think you need it - your blood pressure is always on the low side. Please have Dr. Learta Codding send me a copy of his note through the Epic EMR. Continue on your spiriva and tramadol in the morning with the nebulizer during the day. Call your oxygen tank provider to have your tanks checked.   Managing Your High Blood Pressure Blood pressure is a measurement of how forceful your blood is pressing against the walls of the arteries. Arteries are muscular tubes within the circulatory system. Blood pressure does not stay the same. Blood pressure rises when you are active, excited, or nervous; and it lowers during sleep and relaxation. If the numbers measuring your blood pressure stay above normal most of the time, you are at risk for health problems. High blood pressure (hypertension) is a long-term (chronic) condition in which blood pressure is elevated. A blood pressure reading is recorded as two numbers, such as 120 over 80 (or 120/80). The first, higher number is called the systolic pressure. It is a measure of the pressure in your arteries as the heart beats. The second, lower number is called the diastolic pressure. It is a measure of the pressure in your arteries as the heart relaxes between beats.  Keeping your blood pressure in a normal range is important to your overall health and prevention of health problems, such as heart disease and stroke. When your blood pressure is uncontrolled, your heart has to work harder than normal. High blood pressure is a very common condition in adults because blood pressure tends to rise with age. Men and women are equally likely to have hypertension but at different times in life. Before age 84, men are more likely to have hypertension. After 79 years of age, women are more likely to have it. Hypertension is especially common in African Americans. This condition often has no signs or symptoms. The cause of the condition is usually not known.  Your caregiver can help you come up with a plan to keep your blood pressure in a normal, healthy range. BLOOD PRESSURE STAGES Blood pressure is classified into four stages: normal, prehypertension, stage 1, and stage 2. Your blood pressure reading will be used to determine what type of treatment, if any, is necessary. Appropriate treatment options are tied to these four stages:  Normal  Systolic pressure (mm Hg): below 120.  Diastolic pressure (mm Hg): below 80. Prehypertension  Systolic pressure (mm Hg): 120 to 139.  Diastolic pressure (mm Hg): 80 to 89. Stage1  Systolic pressure (mm Hg): 140 to 159.  Diastolic pressure (mm Hg): 90 to 99. Stage2  Systolic pressure (mm Hg): 160 or above.  Diastolic pressure (mm Hg): 100 or above. RISKS RELATED TO HIGH BLOOD PRESSURE Managing your blood pressure is an important responsibility. Uncontrolled high blood pressure can lead to:  A heart attack.  A stroke.  A weakened blood vessel (aneurysm).  Heart failure.  Kidney damage.  Eye damage.  Metabolic syndrome.  Memory and concentration problems. HOW TO MANAGE YOUR BLOOD PRESSURE Blood pressure can be managed effectively with lifestyle changes and medicines (if needed). Your caregiver will help you come up with a plan to bring your blood pressure within a normal range. Your plan should include the following: Education  Read all information provided by your caregivers about how to control blood pressure.  Educate yourself on the latest guidelines and treatment recommendations. New research is always being done to further define the risks and treatments  for high blood pressure. Lifestylechanges  Control your weight.  Avoid smoking.  Stay physically active.  Reduce the amount of salt in your diet.  Reduce stress.  Control any chronic conditions, such as high cholesterol or diabetes.  Reduce your alcohol intake. Medicines  Several medicines (antihypertensive  medicines) are available, if needed, to bring blood pressure within a normal range. Communication  Review all the medicines you take with your caregiver because there may be side effects or interactions.  Talk with your caregiver about your diet, exercise habits, and other lifestyle factors that may be contributing to high blood pressure.  See your caregiver regularly. Your caregiver can help you create and adjust your plan for managing high blood pressure. RECOMMENDATIONS FOR TREATMENT AND FOLLOW-UP  The following recommendations are based on current guidelines for managing high blood pressure in nonpregnant adults. Use these recommendations to identify the proper follow-up period or treatment option based on your blood pressure reading. You can discuss these options with your caregiver.  Systolic pressure of 098 to 119 or diastolic pressure of 80 to 89: Follow up with your caregiver as directed.  Systolic pressure of 147 to 829 or diastolic pressure of 90 to 100: Follow up with your caregiver within 2 months.  Systolic pressure above 562 or diastolic pressure above 130: Follow up with your caregiver within 1 month.  Systolic pressure above 865 or diastolic pressure above 784: Consider antihypertensive therapy; follow up with your caregiver within 1 week.  Systolic pressure above 696 or diastolic pressure above 295: Begin antihypertensive therapy; follow up with your caregiver within 1 week. Document Released: 10/16/2011 Document Reviewed: 10/16/2011 [facility] Patient Information 2015 [place]. This information is not intended to replace advice given to you by your health care provider. Make sure you discuss any questions you have with your health care provider.

## 2014-02-11 NOTE — Progress Notes (Signed)
Subjective:  This chart was scribed for Delman Cheadle, MD by Donato Schultz, Medical Scribe. This patient was seen in Room 27 and the patient's care was started at 10:18 AM.   Patient ID: Bianca [name], female    DOB: 09-13-1930, 81 y.o.   MRN: [id number] \ Chief Complaint  Patient presents with  . Follow-up    pneumonia  . Pneumonia     HPI HPI Comments: Bianca [name] is a 81 y.o. female with a history of ovarian cancer who presents to the [facility] for a follow-up visit concerning a recent diagnosis of pneumonia.  She finished all of her prescribed medications takes her Albuterol nebulizer four times daily and her Spiriva once every morning.  Her last nebulizer treatment was between 5-6 AM this morning.  She denies tremors and productive cough as associated symptoms.  She has been taking Tramadol every morning and suspects this may be warding off a cough.  Her oxygen at home is at baseline.    She takes Prolia every 6 months.  She takes 25mg  Losartan daily for her blood pressure.  She takes Tamoxifen.    She does not see Dr. Melford Aase regularly.  She has an appointment with Dr. Ammie Dalton for further evaluation of her cancer.     Past Medical History  Diagnosis Date  . DIVERTICULOSIS, COLON   . VITAMIN D DEFICIENCY   . HYPERLIPIDEMIA   . HYPERTENSION   . OSTEOPOROSIS   . COPD (chronic obstructive pulmonary disease)     PFTs 02/10/12: mild-mod  . Cancer     ovarian ca   Past Surgical History  Procedure Laterality Date  . Cystoscopy  07/2005  . Vertebroplasty  03/2005    Dr. Vernard Gambles   Family History  Problem Relation Age of Onset  . Heart disease Mother   . Hypertension Mother   . Heart disease Father   . Hypertension Father    History   Social History  . Marital Status: Married    Spouse Name: N/A    Number of Children: N/A  . Years of Education: N/A   Occupational History  . Not on file.   Social History Main Topics  . Smoking status: Former Smoker  -- 0.30 packs/day for 31 years    Types: Cigarettes    Start date: 02/04/1950    Quit date: 02/04/1981  . Smokeless tobacco: Never Used     Comment: Married, lives with spouse. retired  . Alcohol Use: 1.2 oz/week    2 Glasses of wine per week  . Drug Use: No  . Sexual Activity: Not on file   Other Topics Concern  . Not on file   Social History Narrative   Allergies  Allergen Reactions  . Aspirin Nausea And Vomiting  . Codeine Hives  . Penicillins Swelling  . Shrimp [Shellfish Allergy] Swelling  . Tomato     Swelling, hives   Current Outpatient Prescriptions on File Prior to Visit  Medication Sig Dispense Refill  . albuterol (PROVENTIL) (2.5 MG/3ML) 0.083% nebulizer solution Take 3 mLs (2.5 mg total) by nebulization every 6 (six) hours as needed for wheezing or shortness of breath. 150 mL 1  . denosumab (PROLIA) 60 MG/ML SOLN injection Inject 60 mg into the skin every 6 (six) months. Administer in upper arm, thigh, or abdomen. Dr Kathrin Penner office 1 mL   . loperamide (IMODIUM A-D) 2 MG tablet Take 2 mg by mouth as needed for diarrhea or loose  stools.    Marland Kitchen LORazepam (ATIVAN) 0.5 MG tablet Take one half tablet (0.25 mg) by mouth or under the tongue every six hours as needed for nausea. 6 tablet 0  . Multiple Vitamins-Minerals (MULTI-VITAMIN GUMMIES) CHEW Chew 1 tablet by mouth daily.     . tamoxifen (NOLVADEX) 20 MG tablet Take 1 tablet (20 mg total) by mouth daily. 30 tablet 1  . tiotropium (SPIRIVA) 18 MCG inhalation capsule Place 1 capsule (18 mcg total) into inhaler and inhale daily as needed (Shortness of breath). 30 capsule 2  . traMADol (ULTRAM) 50 MG tablet Take 1 to 2 tablets every 8 hours as needed for moderate pain 30 tablet 0  . trolamine salicylate (ASPERCREME) 10 % cream Apply 1 application topically as needed for muscle pain.    Marland Kitchen ibuprofen (ADVIL,MOTRIN) 200 MG tablet Take 200 mg by mouth every 6 (six) hours as needed.     No current facility-administered medications  on file prior to visit.     Review of Systems  Respiratory: Negative for cough.   Neurological: Negative for tremors.    Objective:  BP 100/72 mmHg  Pulse 99  Temp(Src) 98.5 F (36.9 C) (Oral)  Resp 16  Ht 4' 8.25" (1.429 m)  Wt 82 lb 9.6 oz (37.467 kg)  BMI 18.35 kg/m2  SpO2 83%  Physical Exam  Constitutional: She is oriented to person, place, and time. She appears well-developed and well-nourished.  HENT:  Head: Normocephalic and atraumatic.  Eyes: EOM are normal.  Neck: Normal range of motion.  Cardiovascular: Regular rhythm, S1 normal, S2 normal and normal heart sounds.  Tachycardia present.  Exam reveals no gallop and no friction rub.   No murmur heard. Pulmonary/Chest: Effort normal. No respiratory distress. She has decreased breath sounds (throughout). She has no wheezes. She has no rhonchi. She has no rales.  Musculoskeletal: Normal range of motion.  Neurological: She is alert and oriented to person, place, and time.  Skin: Skin is warm and dry.  Psychiatric: She has a normal mood and affect. Her behavior is normal.  Nursing note and vitals reviewed.   Results for orders placed or performed in visit on 02/11/14  Comprehensive metabolic panel  Result Value Ref Range   Sodium 135 135 - 145 mEq/L   Potassium 4.3 3.5 - 5.3 mEq/L   Chloride 96 96 - 112 mEq/L   CO2 29 19 - 32 mEq/L   Glucose, Bld 81 70 - 99 mg/dL   BUN 19 6 - 23 mg/dL   Creat 0.46 (L) 0.50 - 1.10 mg/dL   Total Bilirubin 0.4 0.2 - 1.2 mg/dL   Alkaline Phosphatase 40 39 - 117 U/L   AST 21 0 - 37 U/L   ALT <8 0 - 35 U/L   Total Protein 7.2 6.0 - 8.3 g/dL   Albumin 3.8 3.5 - 5.2 g/dL   Calcium 8.9 8.4 - 10.5 mg/dL  POCT CBC  Result Value Ref Range   WBC 6.9 4.6 - 10.2 K/uL   Lymph, poc 2.4 0.6 - 3.4   POC LYMPH PERCENT 35.5 10 - 50 %L   MID (cbc) 0.1 0 - 0.9   POC MID % 1.7 0 - 12 %M   POC Granulocyte 4.3 2 - 6.9   Granulocyte percent 62.8 37 - 80 %G   RBC 3.65 (A) 4.04 - 5.48 M/uL    Hemoglobin 12.0 (A) 12.2 - 16.2 g/dL   HCT, POC 36.7 (A) 37.7 - 47.9 %   MCV  100.7 (A) 80 - 97 fL   MCH, POC 32.8 (A) 27 - 31.2 pg   MCHC 32.6 31.8 - 35.4 g/dL   RDW, POC 15.4 %   Platelet Count, POC 101 (A) 142 - 424 K/uL   MPV 6.9 0 - 99.8 fL      Assessment & Plan:  Will discontinue Losartan. Will administer a second breathing treatment and collect blood work. COPD exacerbation - Plan: POCT CBC, POCT SEDIMENTATION RATE - flair resolved. O2 sat nml at 98% at rest after duoneb and warming fingers.  Decrease to prior home O2 level of 3L and monitor O2 - ok to increase to 4L if home O1 sat <92%.  Doing much better - cont qam spiriva and tramadol with qid albuterol nebs.  Encounter for long-term (current) use of medications - Plan: Comprehensive metabolic panel  Health Maintenance - ok to establish PCP care here w/ me  Ovarian cancer - sees Dr. Marko Plume - pt reports care was withdrawn but encouraged to seek second opinion w/ Dr. Learta Codding as she would like to consider trying treatment again if they think there is a role for that. Also wants to know if she should continue her tamoxifen and prolia and wants to know if she should go ahead with a hospice consult if they still don't think she should cont on other treatments.  I do think it would be appropriate to try to stop some meds for chronic illness that do not have a symptomatic benefit for pt.  HTN - d/c losartan due to recent hypotension - goal BP 140s-150s due to multiple co-morbidities and decreased life expectancy.  Cervicalgia - prn tramadol and aspercreme  I personally performed the services described in this documentation, which was scribed in my presence. The recorded information has been reviewed and considered, and addended by me as needed.  Delman Cheadle, MD MPH

## 2014-02-17 ENCOUNTER — Ambulatory Visit (HOSPITAL_BASED_OUTPATIENT_CLINIC_OR_DEPARTMENT_OTHER): Payer: Medicare Other | Admitting: Oncology

## 2014-02-17 ENCOUNTER — Telehealth: Payer: Self-pay | Admitting: Oncology

## 2014-02-17 VITALS — BP 104/76 | HR 95 | Temp 97.5°F | Resp 95 | Ht <= 58 in | Wt 81.6 lb

## 2014-02-17 DIAGNOSIS — C801 Malignant (primary) neoplasm, unspecified: Secondary | ICD-10-CM

## 2014-02-17 DIAGNOSIS — A047 Enterocolitis due to Clostridium difficile: Secondary | ICD-10-CM

## 2014-02-17 DIAGNOSIS — M810 Age-related osteoporosis without current pathological fracture: Secondary | ICD-10-CM

## 2014-02-17 DIAGNOSIS — J449 Chronic obstructive pulmonary disease, unspecified: Secondary | ICD-10-CM

## 2014-02-17 DIAGNOSIS — C562 Malignant neoplasm of left ovary: Secondary | ICD-10-CM

## 2014-02-17 NOTE — Telephone Encounter (Signed)
gv and printed appt sched and avs for pt for Jan adn Feb....gv pt barium  °

## 2014-02-17 NOTE — Progress Notes (Signed)
[facility] Patient Consult   Referring MD: Joannie Medine 81 y.o.  1930-12-08    Reason for Referral: Ovarian cancer   HPI: She is followed by Dr. Lynann Bologna for back pain. She developed pain in the left leg and was referred for an MRI of the spine in July that revealed a pelvic mass (the MRI is not available in the [facility] system) sees. A pelvic ultrasound on 08/24/2013 revealed a left adnexal mass. She was evaluating by GYN oncology and an MRI of the pelvis 09/14/2013 revealed a 43 m complex cystic and solid mass in the left adnexa and multilobar soft-tissue masses along the left pelvic sidewall. The masses involved the sigmoid colon but did not appear to arise from the colon. CTs 09/23/2013 revealed no evidence of metastatic disease in the chest, a cystic lesion was confirmed in the left adnexa with soft tissue nodule along the left iliac vessels and nodular implants posterior to the uterus. A single nodule was noted at the inferior medial margin of the liver.  A CT-guided biopsy of the left pelvic mass on 09/27/2013 confirmed a poorly differentiated adenocarcinoma, with an immunohistochemical profile most consistent with a GYN primary.  She was referred to Dr. Marko Plume and completed 4 treatments with single agent carboplatin on a 3 week schedule. She reports tolerating the chemotherapy well. There was initial improvement in the elevated CA 125. A restaging CT 11/29/2013 revealed an interval decrease in the complex left adnexal mass and peritoneal metastatic disease. No new or progressive metastatic disease.  When she saw Dr. Marko Plume after cycle 4 carboplatin she complained of increased pain at the neck and decline imaging/orthopedic evaluation. Dr. Marko Plume recommended tamoxifen therapy. Ms. Sermeno started tamoxifen and reports tolerating this well.  She reports no significant pain at present. She reports being diagnosed with "pneumonia "2 weeks ago and was treated  with Levaquin. Her symptoms improved.   Past Medical History  Diagnosis Date  . DIVERTICULOSIS, COLON   . VITAMIN D DEFICIENCY   . HYPERLIPIDEMIA   . HYPERTENSION   . OSTEOPOROSIS   . COPD (chronic obstructive pulmonary disease)     PFTs 02/10/12: mild-mod  . Cancer     ovarian ca    .  G4 P4   .  History of vertebral compression fractures   .  History of C. difficile colitis Past Surgical History  Procedure Laterality Date  . Cystoscopy  07/2005  . Vertebroplasty  03/2005    Dr. Vernard Gambles    Medications: Reviewed  Allergies:  Allergies  Allergen Reactions  . Aspirin Nausea And Vomiting  . Codeine Hives  . Penicillins Swelling  . Shrimp [Shellfish Allergy] Swelling  . Tomato     Swelling, hives    Family history: Her brother had pancreatic cancer. No other family history of cancer.  Social History:   She lives with her husband and [place]. She was a Animal nutritionist ". She quit smoking cigarettes greater than 20 years ago. She has one glass of wine each night.     ROS:   Positives include: Intermittent cough, chronic diarrhea (1-2 times per week) improved with Imodium, recent treatment for "pneumonia "., Intermittent pain at the left hip, chronic back pain  A complete ROS was otherwise negative.  Physical Exam:  Blood pressure 104/76, pulse 95, temperature 97.5 F (36.4 C), temperature source Oral, resp. rate 95, height 4' 8.25" (1.429 m), weight 81 lb 9.6 oz (37.014 kg), SpO2 92 %.  HEENT: Oropharynx without visible mass, neck without mass Lungs: Distant breath sounds, clear bilaterally Cardiac: Regular rate and rhythm Abdomen: No hepatomegaly, nontender, no mass, no apparent ascites  Vascular: No leg edema Lymph nodes: No cervical, supra-clavicular, axillary, or inguinal nodes Neurologic: Alert and oriented, the motor exam appears grossly intact in the upper and lower extremities Skin: No rash Musculoskeletal: Full range of motion  at the left hip without pain, no tenderness at the left trochanter   LAB:  CBC  Lab Results  Component Value Date   WBC 6.9 02/11/2014   HGB 12.0* 02/11/2014   HCT 36.7* 02/11/2014   MCV 100.7* 02/11/2014   PLT 226 01/07/2014   NEUTROABS 6.2 01/07/2014     CMP      Component Value Date/Time   NA 135 02/11/2014 1053   NA 136 01/07/2014 1311   K 4.3 02/11/2014 1053   K 4.1 01/07/2014 1311   CL 96 02/11/2014 1053   CO2 29 02/11/2014 1053   CO2 29 01/07/2014 1311   GLUCOSE 81 02/11/2014 1053   GLUCOSE 123 01/07/2014 1311   BUN 19 02/11/2014 1053   BUN 20.1 01/07/2014 1311   CREATININE 0.46* 02/11/2014 1053   CREATININE 0.7 01/07/2014 1311   CREATININE 0.60 09/14/2013 1141   CALCIUM 8.9 02/11/2014 1053   CALCIUM 9.9 01/07/2014 1311   PROT 7.2 02/11/2014 1053   PROT 7.9 01/07/2014 1311   ALBUMIN 3.8 02/11/2014 1053   ALBUMIN 3.6 01/07/2014 1311   AST 21 02/11/2014 1053   AST 28 01/07/2014 1311   ALT <8 02/11/2014 1053   ALT 13 01/07/2014 1311   ALKPHOS 40 02/11/2014 1053   ALKPHOS 71 01/07/2014 1311   BILITOT 0.4 02/11/2014 1053   BILITOT 0.41 01/07/2014 1311   GFRNONAA >90 01/06/2013 0645   GFRAA >90 01/06/2013 0645   CA 125 on 01/07/2014-1777  Poorly differentiated adenocarcinoma  Assessment/Plan:   1. Poorly different it adenocarcinoma involving a left adnexal mass with radiographic evidence of carcinomatosis, probable GYN primary  Status post 4 cycles of single agent carboplatin, last given 12/08/2013  CT of the abdomen/pelvis 11/29/2013, after 3 cycles of carboplatin, revealed improvement in the left adnexal mass and carcinomatosis  Therapy switch to tamoxifen on 01/07/2014   2. Oxygen dependent COPD  3.  History of left lower extremity pain, likely secondary to benign musculoskeletal disease involving the spine versus left pelvic sidewall tumor  4.  Osteoporosis/compression fractures  5.  History of C. difficile colitis   Disposition:   Ms.  Dumler was diagnosed with an advanced metastatic malignancy involving a left adnexal mass and carcinomatosis. She most likely has a GYN primary. She completed 4 treatments with single agent carboplatin with improvement in the elevated CA 125 and carcinomatosis. Treatment was switched to tamoxifen secondary to her poor performance status and comorbid conditions when she was seen by Dr. Marko Plume 01/07/2014.  Ms. Shepherd appears to be tolerating the tamoxifen well. There is no clinical evidence of disease progression. She will continue tamoxifen. She will be scheduled for a restaging CT and CA 125 prior to an office visit 03/07/2014.  She will contact us in the interim for new symptoms. We will consider second line chemotherapy options if there is disease progression on tamoxifen.  [place], [place] 02/17/2014, 2:35 PM

## 2014-02-21 NOTE — Progress Notes (Signed)
Subjective:    Patient ID: Bianca [name], female    DOB: [birth year], 81 y.o.   MRN: [id number] Chief Complaint  Patient presents with  . Follow-up    Low oxygen yesterday    HPI  F/u from OV yesterday where I diagnosed w/ with copd exacerbation and pneumonia. Here today w/ her daughter who [place] - daughrer had not seen her mother in quite sometime and was very concerned so brought her in yest.  O2 sat yesterday in clinic was 88-90% - pt is on 3-4 L/min oxygen at all times and had increased flow to 6:/min last night during her acute illness.- she does have a home pulse ox as well and reports this morning before she gout of bed her O2 stat was approx 94-96%.  Continue daily chronic spirva. a Doing MUCH better today - rapid improvement since yest on prednisone. Pt lives w/ her husband who might be having some memory problnms - fortunately, 2 daughters live in [place] on her frequently.  Past Medical History  Diagnosis Date  . DIVERTICULOSIS, COLON   . VITAMIN D DEFICIENCY   . HYPERLIPIDEMIA   . HYPERTENSION   . OSTEOPOROSIS   . COPD (chronic obstructive pulmonary disease)     PFTs 02/10/12: mild-mod  . Cancer     ovarian ca   Current Outpatient Prescriptions on File Prior to Visit  Medication Sig Dispense Refill  . albuterol (PROVENTIL) (2.5 MG/3ML) 0.083% nebulizer solution Take 3 mLs (2.5 mg total) by nebulization every 6 (six) hours as needed for wheezing or shortness of breath. 150 mL 1  . denosumab (PROLIA) 60 MG/ML SOLN injection Inject 60 mg into the skin every 6 (six) months. Administer in upper arm, thigh, or abdomen. Dr Kathrin Penner office 1 mL   . ibuprofen (ADVIL,MOTRIN) 200 MG tablet Take 200 mg by mouth every 6 (six) hours as needed.    . loperamide (IMODIUM A-D) 2 MG tablet Take 2 mg by mouth as needed for diarrhea or loose stools.    Marland Kitchen LORazepam (ATIVAN) 0.5 MG tablet Take one half tablet (0.25 mg) by mouth or under the tongue every six hours as needed for  nausea. 6 tablet 0  . Multiple Vitamins-Minerals (MULTI-VITAMIN GUMMIES) CHEW Chew 1 tablet by mouth daily.     . tamoxifen (NOLVADEX) 20 MG tablet Take 1 tablet (20 mg total) by mouth daily. 30 tablet 1  . traMADol (ULTRAM) 50 MG tablet Take 1 to 2 tablets every 8 hours as needed for moderate pain 30 tablet 0  . trolamine salicylate (ASPERCREME) 10 % cream Apply 1 application topically as needed for muscle pain.     No current facility-administered medications on file prior to visit.   Allergies  Allergen Reactions  . Aspirin Nausea And Vomiting  . Codeine Hives  . Penicillins Swelling  . Shrimp [Shellfish Allergy] Swelling  . Tomato     Swelling, hives     Review of Systems  Constitutional: Positive for activity change, appetite change, fatigue and unexpected weight change. Negative for fever, chills and diaphoresis.  HENT: Positive for congestion.   Eyes: Negative for visual disturbance.  Respiratory: Positive for cough and shortness of breath. Negative for wheezing.   Cardiovascular: Negative for chest pain, palpitations and leg swelling.  Gastrointestinal: Positive for abdominal pain.  Genitourinary: Negative for decreased urine volume.  Neurological: Negative for syncope and headaches.  Hematological: Does not bruise/bleed easily.  Psychiatric/Behavioral: Positive for sleep disturbance.  Objective:  BP 124/76 mmHg  Pulse 97  Temp(Src) 98.1 F (36.7 C) (Oral)  Resp 18  Ht 4' 8.5" (1.435 m)  Wt 82 lb (37.195 kg)  BMI 18.06 kg/m2  SpO2 88%  Physical Exam  Constitutional: She is oriented to person, place, and time. She appears well-developed and well-nourished. No distress.  HENT:  Head: Normocephalic and atraumatic.  Right Ear: External ear normal.  Left Ear: External ear normal.  Eyes: Conjunctivae are normal. No scleral icterus.  Neck: Normal range of motion. Neck supple. No thyromegaly present.  Cardiovascular: Normal rate, regular rhythm, normal heart  sounds and intact distal pulses.   Pulmonary/Chest: Effort normal. No respiratory distress. She has decreased breath sounds. She has no wheezes. She has rhonchi. She has rales.  Musculoskeletal: She exhibits no edema.  Lymphadenopathy:    She has no cervical adenopathy.  Neurological: She is alert and oriented to person, place, and time.  Skin: Skin is warm and dry. She is not diaphoretic. No erythema.  Psychiatric: She has a normal mood and affect. Her behavior is normal.          Assessment & Plan:  COPD exacerbation - pt started daily preventative Spiriva yest which should be  - was taking prn prior.   Cont to monitor pulse ox at home and starting weaning back to her baseline supplemental O2  Of 3-4L/min (increased to 6L/min yesterday) as tolerated to maintain O2 sat 92 - 96%.  Cont on qid albuterol neb treatments that were started yest until sxs completely back to baseline - then rec gradually wean down the neb treatments.   CAP (community acquired pneumonia) - Pt instructed to ensure she finishes the complete course of prednisone and levaquin which was started yesterday. Fortunately, she is feeling MUCH better today after starting rxs.   Rec recheck w/ me in 1 wk - sooner if worsening - when pt is on last day of levaquin.  Sched on 2/14 at 104 w/ me for this.   Ovarian cancer -   HTN - has been hypotensive recently so d/c losartan for now.   Meds ordered this encounter  Medications  . tiotropium (SPIRIVA) 18 MCG inhalation capsule    Sig: Place 1 capsule (18 mcg total) into inhaler and inhale daily as needed (Shortness of breath).    Dispense:  30 capsule    Refill:  2    I personally performed the services described in this documentation, which was scribed in my presence. The recorded information has been reviewed and considered, and addended by me as needed.  Delman Cheadle, MD MPH

## 2014-03-02 ENCOUNTER — Encounter (HOSPITAL_COMMUNITY): Payer: Self-pay

## 2014-03-02 ENCOUNTER — Ambulatory Visit (HOSPITAL_COMMUNITY)
Admission: RE | Admit: 2014-03-02 | Discharge: 2014-03-02 | Disposition: A | Discharge status: Home / Self Care | Payer: Medicare Other | Source: Ambulatory Visit | Attending: Oncology | Admitting: Oncology

## 2014-03-02 ENCOUNTER — Other Ambulatory Visit (HOSPITAL_BASED_OUTPATIENT_CLINIC_OR_DEPARTMENT_OTHER): Payer: Medicare Other

## 2014-03-02 ENCOUNTER — Other Ambulatory Visit: Payer: Medicare Other

## 2014-03-02 DIAGNOSIS — Z08 Encounter for follow-up examination after completed treatment for malignant neoplasm: Secondary | ICD-10-CM | POA: Diagnosis not present

## 2014-03-02 DIAGNOSIS — Z9221 Personal history of antineoplastic chemotherapy: Secondary | ICD-10-CM | POA: Diagnosis not present

## 2014-03-02 DIAGNOSIS — C7989 Secondary malignant neoplasm of other specified sites: Secondary | ICD-10-CM | POA: Insufficient documentation

## 2014-03-02 DIAGNOSIS — C562 Malignant neoplasm of left ovary: Secondary | ICD-10-CM

## 2014-03-02 DIAGNOSIS — M25552 Pain in left hip: Secondary | ICD-10-CM | POA: Insufficient documentation

## 2014-03-02 DIAGNOSIS — C569 Malignant neoplasm of unspecified ovary: Secondary | ICD-10-CM | POA: Insufficient documentation

## 2014-03-02 DIAGNOSIS — M79605 Pain in left leg: Secondary | ICD-10-CM | POA: Insufficient documentation

## 2014-03-02 LAB — COMPREHENSIVE METABOLIC PANEL (CC13)
ALBUMIN: 3.5 g/dL (ref 3.5–5.0)
ALT: 7 U/L (ref 0–55)
ANION GAP: 13 meq/L — AB (ref 3–11)
AST: 26 U/L (ref 5–34)
Alkaline Phosphatase: 61 U/L (ref 40–150)
BUN: 11.6 mg/dL (ref 7.0–26.0)
CHLORIDE: 98 meq/L (ref 98–109)
CO2: 26 mEq/L (ref 22–29)
Calcium: 9.1 mg/dL (ref 8.4–10.4)
Creatinine: 0.6 mg/dL (ref 0.6–1.1)
EGFR: 84 mL/min/{1.73_m2} — ABNORMAL LOW (ref >90)
Glucose: 100 mg/dl (ref 70–140)
Potassium: 4.1 mEq/L (ref 3.5–5.1)
Sodium: 138 mEq/L (ref 136–145)
TOTAL PROTEIN: 7.8 g/dL (ref 6.4–8.3)
Total Bilirubin: 0.37 mg/dL (ref 0.20–1.20)

## 2014-03-02 LAB — CBC WITH DIFFERENTIAL/PLATELET
BASO%: 0.5 % (ref 0.0–2.0)
BASOS ABS: 0 10*3/uL (ref 0.0–0.1)
EOS ABS: 0.3 10*3/uL (ref 0.0–0.5)
EOS%: 4.7 % (ref 0.0–7.0)
HCT: 35.5 % (ref 34.8–46.6)
HGB: 11.6 g/dL (ref 11.6–15.9)
LYMPH%: 14.9 % (ref 14.0–49.7)
MCH: 33 pg (ref 25.1–34.0)
MCHC: 32.7 g/dL (ref 31.5–36.0)
MCV: 100.9 fL (ref 79.5–101.0)
MONO#: 1 10*3/uL — ABNORMAL HIGH (ref 0.1–0.9)
MONO%: 15.3 % — AB (ref 0.0–14.0)
NEUT%: 64.6 % (ref 38.4–76.8)
NEUTROS ABS: 4.3 10*3/uL (ref 1.5–6.5)
PLATELETS: 287 10*3/uL (ref 145–400)
RBC: 3.52 10*6/uL — ABNORMAL LOW (ref 3.70–5.45)
RDW: 12.9 % (ref 11.2–14.5)
WBC: 6.7 10*3/uL (ref 3.9–10.3)
lymph#: 1 10*3/uL (ref 0.9–3.3)
nRBC: 0 % (ref 0–0)

## 2014-03-02 MED ORDER — IOHEXOL 300 MG/ML  SOLN
80.0000 mL | Freq: Once | INTRAMUSCULAR | Status: AC | PRN
  Administered 2014-03-02: 80 mL via INTRAVENOUS

## 2014-03-02 MED ORDER — IOHEXOL 300 MG/ML  SOLN
50.0000 mL | Freq: Once | INTRAMUSCULAR | Status: AC | PRN
  Administered 2014-03-02: 50 mL via ORAL

## 2014-03-03 LAB — CA 125: CA 125: 543 U/mL — AB (ref <35)

## 2014-03-04 ENCOUNTER — Other Ambulatory Visit: Payer: Self-pay | Admitting: Oncology

## 2014-03-07 ENCOUNTER — Ambulatory Visit (HOSPITAL_BASED_OUTPATIENT_CLINIC_OR_DEPARTMENT_OTHER): Payer: Medicare Other | Admitting: Oncology

## 2014-03-07 ENCOUNTER — Telehealth: Payer: Self-pay | Admitting: Oncology

## 2014-03-07 VITALS — BP 124/83 | HR 96 | Temp 98.2°F | Resp 18 | Ht <= 58 in | Wt 81.7 lb

## 2014-03-07 DIAGNOSIS — C562 Malignant neoplasm of left ovary: Secondary | ICD-10-CM

## 2014-03-07 DIAGNOSIS — C775 Secondary and unspecified malignant neoplasm of intrapelvic lymph nodes: Secondary | ICD-10-CM

## 2014-03-07 DIAGNOSIS — C801 Malignant (primary) neoplasm, unspecified: Secondary | ICD-10-CM

## 2014-03-07 DIAGNOSIS — M810 Age-related osteoporosis without current pathological fracture: Secondary | ICD-10-CM

## 2014-03-07 NOTE — Progress Notes (Signed)
  [facility] OFFICE PROGRESS NOTE   Diagnosis: Ovarian cancer  INTERVAL HISTORY:   Ms. Crear returns as scheduled. She has intermittent pain in the left iliac region that radiates into the left leg. The pain occurred last night. She reports this pain is only present a few days out of the month. She takes tramadol for relief of pain. No other complaint.  Objective:  Vital signs in last 24 hours:  Blood pressure 124/83, pulse 96, temperature 98.2 F (36.8 C), temperature source Oral, resp. rate 18, height 4' 8.25" (1.429 m), weight 81 lb 11.2 oz (37.059 kg), SpO2 92 %.   Resp: Lungs with distant breath sounds, no respiratory distress Cardio: Regular rate and rhythm GI: No hepatosplenomegaly, nontender, no apparent ascites Vascular: No leg edema Musculoskeletal: Tender at the left iliac and upper buttock, no mass. No pain with movement at the left hip      Lab Results:  Lab Results  Component Value Date   WBC 6.7 03/02/2014   HGB 11.6 03/02/2014   HCT 35.5 03/02/2014   MCV 100.9 03/02/2014   PLT 287 03/02/2014   NEUTROABS 4.3 03/02/2014   CA 125-543  Medications: I have reviewed the patient's current medications.  Assessment/Plan: 1. Poorly different it adenocarcinoma involving a left adnexal mass with radiographic evidence of carcinomatosis, probable GYN primary  Status post 4 cycles of single agent carboplatin, last given 12/08/2013  CT of the abdomen/pelvis 11/29/2013, after 3 cycles of carboplatin, revealed improvement in the left adnexal mass and carcinomatosis  Therapy switch to tamoxifen on 01/07/2014  CT abdomen/pelvis 03/02/2014-left adnexal lesion and peritoneal lesions are smaller   2. Oxygen dependent COPD  3. History of left lower extremity pain, likely secondary to benign musculoskeletal disease involving the spine versus left pelvic sidewall tumor  4. Osteoporosis/compression fractures  5. History of C. difficile  colitis    Disposition:  Her overall status appears unchanged. I reviewed the CT images with Ms. Duffey and her daughter. The CA-125 is lower. I recommend continuing tamoxifen. The left pelvic/leg pain is most likely related to benign musculoskeletal condition, though it is possible the pain is secondary to tumor involving the pelvis.  She will return for an office visit and CA 125 in 2 months. We will see her in the interim as needed.  Betsy Coder, MD  03/07/2014  11:53 AM

## 2014-03-07 NOTE — Telephone Encounter (Signed)
gv adn printed appt scehd and avs for pt for March...pt needed tuesday appt

## 2014-03-23 ENCOUNTER — Telehealth: Payer: Self-pay

## 2014-03-23 NOTE — Telephone Encounter (Signed)
I have told patient to come in on Friday at 2:15. There is an opening, but the computer would not let me book it. I am sending message to you and also to Maudie Mercury hopefully she will be able to put patient on the schedule so there will be no confusion about this.

## 2014-03-23 NOTE — Telephone Encounter (Signed)
Dr. Brigitte Pulse would you be willing to send in a cough medication? She was prescribed prednisone last OV. Let me know. Thanks

## 2014-03-23 NOTE — Telephone Encounter (Signed)
When pt was seen 6 wks ago she was put on prednisone and levaquin for a copd exacerbation.   Hopefully she has stopped her losartan as that can often cause a chronic cough. How is her oxygen sat? Is she taking her spiriva every morning and then using her nebulizer albuterol treatments during the day? Is she using her tramadol for cough suppression? Is she feeling sick with cough and cold symptoms?  Is cough productive She is welcome to see if at 104. I do not feel good about calling in an antibiotic for her due to her history of c. Diff but I worry about treating her with prednisone which could suppress her immune system and make a bacterial infection worse so it would be best for her to be seen at 104 but if she really doesn't want to we could consider trying a medrol dose pack. . . .

## 2014-03-23 NOTE — Telephone Encounter (Signed)
I have called patient, she states she is not using Losartan  She states her oxygen level is 98-99  She is using the Albuterol but trying not to use the Tramadol.

## 2014-03-23 NOTE — Telephone Encounter (Signed)
Pt called up here wanting to know if we could prescribe her something for her dry cough. She just saw Dr. Brigitte Pulse on 1/1 and 1/8 for the same issue. She has cancer and does not want to come out in this weather. Please advise at [phone]

## 2014-03-25 NOTE — Telephone Encounter (Signed)
I guess pt couldn't make it into clinic today.   If she feels like she is getting ill with increased cough with increased sputum and a change in sputum appearance from normal, ok to call her in prednisone 40mg  qd x 5d and doxycycline 100 bid x 7d but of course there are significant risks of taking steroids (high blood sugar, suppressed immune system) and taking antibiotics (yeast infection, c. Diff diarrhea, antibiotic resistence) when we are not sure if they will help or not due to lack of a provider's exam. . . .  If the tramadol works for her cough and doesn't make her to sedated then I would encourage her NOT to be to stingy with this medicine - it certainly has way less side effects than antibiotics or prednisone.  Happy to call in refill of that if needed.

## 2014-03-28 NOTE — Telephone Encounter (Signed)
Spoke with pt, advised message from Dr. Brigitte Pulse. She stated she has cancer and this would be too much for her right now. Declined the medications. Advised to call me back if she changes her mind.

## 2014-04-03 ENCOUNTER — Other Ambulatory Visit: Payer: Self-pay | Admitting: Oncology

## 2014-04-03 DIAGNOSIS — C562 Malignant neoplasm of left ovary: Secondary | ICD-10-CM

## 2014-04-11 ENCOUNTER — Telehealth: Payer: Self-pay | Admitting: *Deleted

## 2014-04-11 NOTE — Telephone Encounter (Signed)
VERBAL ORDER AND READ BACK TO DR.[place] VICODIN 5/325 ONE TABLET EVERY FOUR HOURS AS NEEDED FOR PAIN #60. ALSO WILL SEE PT. EARLIER THAN 05/03/14 IF NEEDED. NOTIFIED PT. SHE DID NOT WANT TO TRY THE VICODIN. SHE WILL TRY TWO TABLETS OF TRAMADOL AND CALL IF SHE NEEDS TO BE SEEN EARLIER THAN 05/03/14.

## 2014-04-11 NOTE — Telephone Encounter (Signed)
FOR THE LAST FOUR NIGHTS WHEN PT. GETS UP TO THE BATHROOM SHE HAS SEVERE PAIN AT A TEN IN HER LEFT HIP. PT. TAKES ONE TRAMADOL, RUBS THE LEFT HIP WITH ASPERCREME AND USES HEAT. IT TAKES FROM THREE TO SIX HOURS BEFORE THE PAIN DECREASES TO A TWO OR THREE ON THE PAIN SCALE. PT. DOES NOT WANT TO TAKE TWO TRAMADOL AS ORDERED BECAUSE IT MAKES HER SHAKY AND UNSTEADY ON HER FEET. PT. WANTED DR.SHERRILL TO KNOW ABOUT THIS PAIN SINCE SHE DOES NOT SEE HIM UNTIL 05/03/14.

## 2014-05-03 ENCOUNTER — Ambulatory Visit (HOSPITAL_BASED_OUTPATIENT_CLINIC_OR_DEPARTMENT_OTHER): Payer: Medicare Other | Admitting: Oncology

## 2014-05-03 ENCOUNTER — Other Ambulatory Visit (HOSPITAL_BASED_OUTPATIENT_CLINIC_OR_DEPARTMENT_OTHER): Payer: Medicare Other

## 2014-05-03 ENCOUNTER — Telehealth: Payer: Self-pay | Admitting: Oncology

## 2014-05-03 VITALS — BP 143/76 | HR 98 | Temp 98.3°F | Resp 18 | Ht <= 58 in | Wt 80.3 lb

## 2014-05-03 DIAGNOSIS — M810 Age-related osteoporosis without current pathological fracture: Secondary | ICD-10-CM | POA: Diagnosis not present

## 2014-05-03 DIAGNOSIS — C801 Malignant (primary) neoplasm, unspecified: Secondary | ICD-10-CM | POA: Diagnosis not present

## 2014-05-03 DIAGNOSIS — C787 Secondary malignant neoplasm of liver and intrahepatic bile duct: Secondary | ICD-10-CM | POA: Diagnosis not present

## 2014-05-03 DIAGNOSIS — C775 Secondary and unspecified malignant neoplasm of intrapelvic lymph nodes: Secondary | ICD-10-CM

## 2014-05-03 DIAGNOSIS — C562 Malignant neoplasm of left ovary: Secondary | ICD-10-CM

## 2014-05-03 NOTE — Progress Notes (Signed)
  [place] OFFICE PROGRESS NOTE   Diagnosis: Ovarian cancer  INTERVAL HISTORY:   Bianca [name] returns as scheduled. She continues to have pain at the left pelvis radiating into the left leg. She takes tramadol once daily. She also takes Tylenol for pain. She reports a recent episode of nausea without emesis. She complains of generalized weakness that has limited her ambulation.  Objective:  Vital signs in last 24 hours:  Blood pressure 143/76, pulse 98, temperature 98.3 F (36.8 C), temperature source Oral, resp. rate 18, height 4' 8.25" (1.429 m), weight 80 lb 4.8 oz (36.424 kg), SpO2 97 %.    Resp: Distant breath sounds, no respiratory distress Cardio: Regular rate and rhythm GI: Soft, no apparent ascites, no mass, nontender Vascular: No leg edema Neuro: The motor exam appears intact in the legs bilaterally  Musculoskeletal: She is tender at the left lower iliac area, no pain with motion at the left hip     Lab Results:  CA-125 on 03/02/2014-543  Medications: I have reviewed the patient's current medications.  Assessment/Plan: 1. Poorly different it adenocarcinoma involving a left adnexal mass with radiographic evidence of carcinomatosis, probable GYN primary  Status post 4 cycles of single agent carboplatin, last given 12/08/2013  CT of the abdomen/pelvis 11/29/2013, after 3 cycles of carboplatin, revealed improvement in the left adnexal mass and carcinomatosis  Therapy switch to tamoxifen on 01/07/2014  CT abdomen/pelvis 03/02/2014-left adnexal lesion and peritoneal lesions are smaller  CA 125 improved 03/02/2014   2. Oxygen dependent COPD  3. History of left lower extremity pain, likely secondary to benign musculoskeletal disease involving the spine versus left pelvic sidewall tumor  4. Osteoporosis/compression fractures  5. History of C. difficile colitis   Disposition:  Her overall status appears unchanged. I suspect the left pelvic  pain/tenderness is related to benign must to skeletal condition as opposed to ovarian cancer. She will continue tramadol and Tylenol as needed for pain. She will also try naproxen.  Bianca [name] continues tamoxifen for treatment of ovarian cancer. We will follow-up on the CA-125 from today and she will return for an office visit in 2 months.  Betsy Coder, MD  05/03/2014  12:10 PM

## 2014-05-03 NOTE — Telephone Encounter (Signed)
Gave avs & calendar for May. °

## 2014-05-04 ENCOUNTER — Telehealth: Payer: Self-pay | Admitting: *Deleted

## 2014-05-04 LAB — CA 125: CA 125: 1098 U/mL — ABNORMAL HIGH (ref <35)

## 2014-05-04 NOTE — Telephone Encounter (Signed)
-----   Message from Bianca Pier, MD sent at 05/04/2014  3:01 PM EDT ----- Please call patient, ca125 is higher, but still lower than last year, continue tamoxifen, f/u as scheduled

## 2014-05-04 NOTE — Telephone Encounter (Signed)
Per Dr. Benay Spice; notified pt that ca125 is higher, but still lower than last year, continue tamoxifen.  Pt verbalized understanding and confirmed appt for 06/28/14

## 2014-05-09 ENCOUNTER — Other Ambulatory Visit: Payer: Medicare Other

## 2014-05-09 ENCOUNTER — Ambulatory Visit: Payer: Medicare Other | Admitting: Oncology

## 2014-05-31 ENCOUNTER — Other Ambulatory Visit: Payer: Self-pay | Admitting: Oncology

## 2014-06-28 ENCOUNTER — Telehealth: Payer: Self-pay | Admitting: Oncology

## 2014-06-28 ENCOUNTER — Ambulatory Visit (HOSPITAL_BASED_OUTPATIENT_CLINIC_OR_DEPARTMENT_OTHER): Payer: Medicare Other | Admitting: Oncology

## 2014-06-28 ENCOUNTER — Other Ambulatory Visit: Payer: Self-pay | Admitting: *Deleted

## 2014-06-28 ENCOUNTER — Other Ambulatory Visit (HOSPITAL_BASED_OUTPATIENT_CLINIC_OR_DEPARTMENT_OTHER): Payer: Medicare Other

## 2014-06-28 VITALS — BP 135/67 | HR 93 | Temp 98.2°F | Resp 18 | Ht <= 58 in | Wt 81.2 lb

## 2014-06-28 DIAGNOSIS — C801 Malignant (primary) neoplasm, unspecified: Secondary | ICD-10-CM

## 2014-06-28 DIAGNOSIS — C775 Secondary and unspecified malignant neoplasm of intrapelvic lymph nodes: Secondary | ICD-10-CM | POA: Diagnosis not present

## 2014-06-28 DIAGNOSIS — M810 Age-related osteoporosis without current pathological fracture: Secondary | ICD-10-CM

## 2014-06-28 DIAGNOSIS — C562 Malignant neoplasm of left ovary: Secondary | ICD-10-CM

## 2014-06-28 DIAGNOSIS — Z5111 Encounter for antineoplastic chemotherapy: Secondary | ICD-10-CM | POA: Diagnosis not present

## 2014-06-28 DIAGNOSIS — Z79810 Long term (current) use of selective estrogen receptor modulators (SERMs): Secondary | ICD-10-CM | POA: Diagnosis not present

## 2014-06-28 MED ORDER — TRAMADOL HCL 50 MG PO TABS: ORAL_TABLET | ORAL | Status: DC

## 2014-06-28 NOTE — Progress Notes (Signed)
  [place] OFFICE PROGRESS NOTE   Diagnosis: Ovarian cancer  INTERVAL HISTORY:   Bianca [name] returns as scheduled. She continues tamoxifen. She complains of pain at the left lower back and left leg. The pain is relieved with tramadol. She has a poor energy level and generalized weakness.  Objective:  Vital signs in last 24 hours:  Blood pressure 135/67, pulse 93, temperature 98.2 F (36.8 C), temperature source Oral, resp. rate 18, height 4' 8.25" (1.429 m), weight 81 lb 3.2 oz (36.832 kg), SpO2 95 %.   Resp: Distant breath sounds, bronchial sounds at the left upper posterior chest, no respiratory distress Cardio: Regular rate and rhythm GI: Nontender, no mass, no hepatomegaly, no apparent ascites Vascular: No leg edema Musculoskeletal: Tender at the left posterior iliac, no mass, full range of motion at the left hip without pain  Skin: No rash at the sacrum or left iliac     Lab Results: CEA 125 on 05/03/2014-1098  Medications: I have reviewed the patient's current medications.  Assessment/Plan: 1. Poorly different it adenocarcinoma involving a left adnexal mass with radiographic evidence of carcinomatosis, probable GYN primary  Status post 4 cycles of single agent carboplatin, last given 12/08/2013  CT of the abdomen/pelvis 11/29/2013, after 3 cycles of carboplatin, revealed improvement in the left adnexal mass and carcinomatosis  Therapy switch to tamoxifen on 01/07/2014  CT abdomen/pelvis 03/02/2014-left adnexal lesion and peritoneal lesions are smaller  CA 125 improved 03/02/2014  CA 125 higher 05/03/2014   2. Oxygen dependent COPD  3. History of left lower extremity pain, likely secondary to benign musculoskeletal disease involving the spine versus left pelvic sidewall tumor  4. Osteoporosis/compression fractures  5. History of C. difficile colitis    Disposition:  Her overall status appears unchanged. The CA-125 was higher in  March. We will follow-up on the CEA 125 from today. If higher again she will discontinue tamoxifen. She will be scheduled for a restaging CT evaluation prior to an office visit 07/20/2014. She continues tramadol for pain. I think it is unlikely the pain/tenderness at the left pelvis is related to ovarian cancer.  We discussed treatment options if there is evidence of progressive disease. I recommend repeat treatment with carboplatin. She reports tolerating the carboplatin well and there was a clinical response to the carboplatin. We discussed the potential for an allergic reaction upon retreatment with carboplatin.  Betsy Coder, MD  06/28/2014  12:34 PM

## 2014-06-28 NOTE — Telephone Encounter (Signed)
per pof to sch pt appt-gave pt copy of sch °

## 2014-06-29 ENCOUNTER — Telehealth: Payer: Self-pay | Admitting: *Deleted

## 2014-06-29 LAB — CA 125: CA 125: 1594 U/mL — ABNORMAL HIGH (ref <35)

## 2014-06-29 NOTE — Telephone Encounter (Signed)
-----   Message from Ladell Pier, MD sent at 06/29/2014  3:36 PM EDT ----- Please call patient, ca125 is higher, stop tamoxifen, proceed with CTs and office as scheduled

## 2014-07-05 ENCOUNTER — Telehealth: Payer: Self-pay | Admitting: *Deleted

## 2014-07-05 NOTE — Telephone Encounter (Signed)
Called pt with CA125 results. Instructed her to stop Tamoxifen, per Dr. Benay Spice. Proceed with CT scan. She voiced understanding. Confirmed with Vaughan Basta in managed care, pt does not require prior auth for CT. Gave pt # to contact radiology scheduling.

## 2014-07-18 ENCOUNTER — Other Ambulatory Visit: Payer: Medicare Other

## 2014-07-18 ENCOUNTER — Other Ambulatory Visit (HOSPITAL_BASED_OUTPATIENT_CLINIC_OR_DEPARTMENT_OTHER): Payer: Medicare Other

## 2014-07-18 ENCOUNTER — Ambulatory Visit (HOSPITAL_COMMUNITY)
Admission: RE | Admit: 2014-07-18 | Discharge: 2014-07-18 | Disposition: A | Discharge status: Home / Self Care | Payer: Medicare Other | Source: Ambulatory Visit | Attending: Oncology | Admitting: Oncology

## 2014-07-18 DIAGNOSIS — C562 Malignant neoplasm of left ovary: Secondary | ICD-10-CM | POA: Insufficient documentation

## 2014-07-18 DIAGNOSIS — C801 Malignant (primary) neoplasm, unspecified: Secondary | ICD-10-CM | POA: Diagnosis not present

## 2014-07-18 DIAGNOSIS — Z5111 Encounter for antineoplastic chemotherapy: Secondary | ICD-10-CM | POA: Diagnosis present

## 2014-07-18 DIAGNOSIS — C775 Secondary and unspecified malignant neoplasm of intrapelvic lymph nodes: Secondary | ICD-10-CM | POA: Diagnosis not present

## 2014-07-18 LAB — COMPREHENSIVE METABOLIC PANEL (CC13)
ALT: 6 U/L (ref 0–55)
ANION GAP: 11 meq/L (ref 3–11)
AST: 26 U/L (ref 5–34)
Albumin: 3.7 g/dL (ref 3.5–5.0)
Alkaline Phosphatase: 45 U/L (ref 40–150)
BUN: 14.9 mg/dL (ref 7.0–26.0)
CO2: 26 mEq/L (ref 22–29)
Calcium: 8.8 mg/dL (ref 8.4–10.4)
Chloride: 96 mEq/L — ABNORMAL LOW (ref 98–109)
Creatinine: 0.6 mg/dL (ref 0.6–1.1)
EGFR: 86 mL/min/{1.73_m2} — ABNORMAL LOW (ref >90)
Glucose: 98 mg/dl (ref 70–140)
Potassium: 3.9 mEq/L (ref 3.5–5.1)
Sodium: 133 mEq/L — ABNORMAL LOW (ref 136–145)
Total Bilirubin: 0.43 mg/dL (ref 0.20–1.20)
Total Protein: 7.8 g/dL (ref 6.4–8.3)

## 2014-07-18 LAB — CBC WITH DIFFERENTIAL/PLATELET
BASO%: 0.5 % (ref 0.0–2.0)
Basophils Absolute: 0 10*3/uL (ref 0.0–0.1)
EOS%: 1.6 % (ref 0.0–7.0)
Eosinophils Absolute: 0.1 10*3/uL (ref 0.0–0.5)
HEMATOCRIT: 38.7 % (ref 34.8–46.6)
HGB: 12.7 g/dL (ref 11.6–15.9)
LYMPH#: 1 10*3/uL (ref 0.9–3.3)
LYMPH%: 11.6 % — ABNORMAL LOW (ref 14.0–49.7)
MCH: 32 pg (ref 25.1–34.0)
MCHC: 32.9 g/dL (ref 31.5–36.0)
MCV: 97.3 fL (ref 79.5–101.0)
MONO#: 0.5 10*3/uL (ref 0.1–0.9)
MONO%: 6 % (ref 0.0–14.0)
NEUT#: 6.9 10*3/uL — ABNORMAL HIGH (ref 1.5–6.5)
NEUT%: 80.3 % — ABNORMAL HIGH (ref 38.4–76.8)
Platelets: 215 10*3/uL (ref 145–400)
RBC: 3.97 10*6/uL (ref 3.70–5.45)
RDW: 13.3 % (ref 11.2–14.5)
WBC: 8.6 10*3/uL (ref 3.9–10.3)

## 2014-07-18 MED ORDER — IOHEXOL 300 MG/ML  SOLN
100.0000 mL | Freq: Once | INTRAMUSCULAR | Status: AC | PRN
  Administered 2014-07-18: 75 mL via INTRAVENOUS

## 2014-07-18 MED ORDER — IOHEXOL 300 MG/ML  SOLN
50.0000 mL | Freq: Once | INTRAMUSCULAR | Status: AC | PRN
  Administered 2014-07-18: 50 mL via ORAL

## 2014-07-19 ENCOUNTER — Other Ambulatory Visit: Payer: Self-pay | Admitting: *Deleted

## 2014-07-19 DIAGNOSIS — C562 Malignant neoplasm of left ovary: Secondary | ICD-10-CM

## 2014-07-19 LAB — CA 125: CA 125: 1612 U/mL — ABNORMAL HIGH (ref <35)

## 2014-07-19 MED ORDER — TRAMADOL HCL 50 MG PO TABS: ORAL_TABLET | ORAL | Status: DC

## 2014-07-20 ENCOUNTER — Other Ambulatory Visit: Payer: Self-pay | Admitting: *Deleted

## 2014-07-20 ENCOUNTER — Telehealth: Payer: Self-pay | Admitting: Nurse Practitioner

## 2014-07-20 ENCOUNTER — Ambulatory Visit (HOSPITAL_BASED_OUTPATIENT_CLINIC_OR_DEPARTMENT_OTHER): Payer: Medicare Other | Admitting: Oncology

## 2014-07-20 ENCOUNTER — Other Ambulatory Visit: Payer: Medicare Other

## 2014-07-20 VITALS — BP 131/73 | HR 94 | Temp 98.6°F | Resp 18 | Ht <= 58 in | Wt 83.1 lb

## 2014-07-20 DIAGNOSIS — C801 Malignant (primary) neoplasm, unspecified: Secondary | ICD-10-CM

## 2014-07-20 DIAGNOSIS — Z5111 Encounter for antineoplastic chemotherapy: Secondary | ICD-10-CM | POA: Diagnosis not present

## 2014-07-20 DIAGNOSIS — M545 Low back pain: Secondary | ICD-10-CM

## 2014-07-20 DIAGNOSIS — C775 Secondary and unspecified malignant neoplasm of intrapelvic lymph nodes: Secondary | ICD-10-CM | POA: Diagnosis not present

## 2014-07-20 DIAGNOSIS — C562 Malignant neoplasm of left ovary: Secondary | ICD-10-CM

## 2014-07-20 DIAGNOSIS — M810 Age-related osteoporosis without current pathological fracture: Secondary | ICD-10-CM | POA: Diagnosis not present

## 2014-07-20 MED ORDER — DEXAMETHASONE 4 MG PO TABS: 10.0000 mg | ORAL_TABLET | Freq: Once | ORAL | Status: DC

## 2014-07-20 NOTE — Telephone Encounter (Signed)
Voicemail from patient's daughter about needing this refill.  Observed it was called in last night.  Called Leigh to notify her tramadol should be ready for pick up.

## 2014-07-20 NOTE — Progress Notes (Signed)
[place] OFFICE PROGRESS NOTE   Diagnosis: Ovarian cancer  INTERVAL HISTORY:   Bianca [name] returns as scheduled. She continues to have pain at the left lower back. No new complaint. She has discontinued tamoxifen.  Objective:  Vital signs in last 24 hours:  Blood pressure 131/73, pulse 94, temperature 98.6 F (37 C), temperature source Oral, resp. rate 18, height 4' 8.25" (1.429 m), weight 83 lb 1.6 oz (37.694 kg), SpO2 93 %.   Resp: Distant breath sounds, lungs are clear Cardio: Regular rate and rhythm GI: No hepatosplenomegaly, no mass, no apparent ascites, nontender Vascular: No leg edema Musculoskeletal: Tender at the left posterior iliac  Lab Results:  Lab Results  Component Value Date   WBC 8.6 07/18/2014   HGB 12.7 07/18/2014   HCT 38.7 07/18/2014   MCV 97.3 07/18/2014   PLT 215 07/18/2014   NEUTROABS 6.9* 07/18/2014    CA-125 on 07/18/2014-1612   Imaging:  Ct Abdomen Pelvis W Contrast  07/18/2014   CLINICAL DATA:  Restaging ovarian carcinoma.  EXAM: CT ABDOMEN AND PELVIS WITH CONTRAST  TECHNIQUE: Multidetector CT imaging of the abdomen and pelvis was performed using the standard protocol following bolus administration of intravenous contrast.  CONTRAST:  72mL OMNIPAQUE IOHEXOL 300 MG/ML  SOLN  COMPARISON:  03/02/2014 and 11/29/2013  FINDINGS: Lower chest: The lung bases demonstrate stable emphysematous changes and right lower lobe pulmonary scarring. No worrisome pulmonary nodules. No pericardial effusion. Coronary artery calcifications are noted. The distal esophagus is grossly normal.  Hepatobiliary: No focal hepatic lesions or intrahepatic biliary dilatation. No obvious peritoneal surface disease involving the liver. The common bile duct is normal in caliber for age.  Pancreas: Normal.  Spleen: Normal.  Adrenals/Urinary Tract: Stable small left adrenal gland lesion. The right adrenal gland is normal. Small renal cysts. No worrisome renal lesions.   Stomach/Bowel: The stomach, duodenum, small bowel and colon are unremarkable. No inflammatory changes or obstructive findings.  Vascular/Lymphatic: Stable advanced aortic calcifications. No dissection. The major venous structures are patent.  The left adnexal mass has enlarged since the most recent CT scan. It previously measured 23 x 19 mm and now measures 37 x 28.5 mm. The small cystic area which was a slightly more inferiorly is no longer visualized. There is also a slightly larger implant in the left deep pelvis which measures a maximum of 17 mm. The peritoneal implant in the left upper pelvis is stable. No new lesions.  Other: The uterus is normal and stable. The endometrial thickness is within normal limits. No free pelvic fluid collections. The bladder is normal. No inguinal mass or adenopathy.  Musculoskeletal: Numerous remote compression deformities in the lumbar spine. No new lesions.  IMPRESSION: 1. Interval enlargement of the left adnexal mass. There is also a recurrent/larger left deep pelvic implant. The upper left pelvic implant is stable and the left adrenal gland lesion is stable. 2. No acute abdominal/pelvic findings.   Electronically Signed   By: Marijo Sanes M.D.   On: 07/18/2014 11:49    Medications: I have reviewed the patient's current medications.  Assessment/Plan: 1. 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 Oxygen dependent COPD  3. History of left lower extremity pain, likely secondary to benign musculoskeletal disease involving the spine versus left pelvic sidewall tumor  4. Osteoporosis/compression fractures  5. History of C. difficile colitis   Disposition:  Bianca [name] appears stable. She continues to have pain at the left lower back and pelvis. I think the pain is most likely related to compression fractures and degenerative spine disease. It is possible the pain is related to ovarian cancer. We discussed treatment options including observation and salvage chemotherapy. She would like to proceed with a trial of chemotherapy. The CA 125 stabilized after 4 cycles of carboplatin. I think it is unlikely she would achieve additional benefit with carboplatin. I recommend weekly Taxol. We discussed the potential toxicities associated with Taxol including the chance of an allergic reaction, alopecia, hematologic toxicity, and neuropathy. She agrees to proceed. The plan is to begin weekly Taxol on 07/26/2014. She will be treated on a 3 week on/one-week off schedule. We will follow the CA 125 and plan for a restaging CT after several cycles.  Betsy Coder, MD  07/20/2014  2:43 PM

## 2014-07-20 NOTE — Telephone Encounter (Signed)
per pof to sch pt appt-cld & spoke to daughter Arby Barrette and gave time & date of appts-daughter understood

## 2014-07-20 NOTE — Telephone Encounter (Signed)
Decadron pre-med for 1st tx ordered per Dr. Benay Spice, called pt to explain how to take and confirm pharmacy ([facility] at [address]). Pt voiced understanding.

## 2014-07-20 NOTE — Telephone Encounter (Signed)
per pof tos ch tp appt-sent MW emailt o sch trmt-pt aware-gave pt copy of avs

## 2014-07-20 NOTE — Patient Instructions (Signed)
Paclitaxel injection (Taxol) What is this medicine? PACLITAXEL (PAK li TAX el) is a chemotherapy drug. It targets fast dividing cells, like cancer cells, and causes these cells to die. This medicine is used to treat ovarian cancer, breast cancer, and other cancers. This medicine may be used for other purposes; ask your health care provider or pharmacist if you have questions. COMMON BRAND NAME(S): Onxol, Taxol What should I tell my health care provider before I take this medicine? They need to know if you have any of these conditions: -blood disorders -irregular heartbeat -infection (especially a virus infection such as chickenpox, cold sores, or herpes) -liver disease -previous or ongoing radiation therapy -an unusual or allergic reaction to paclitaxel, alcohol, polyoxyethylated castor oil, other chemotherapy agents, other medicines, foods, dyes, or preservatives -pregnant or trying to get pregnant -breast-feeding How should I use this medicine? This drug is given as an infusion into a vein. It is administered in a hospital or clinic by a specially trained health care professional. Talk to your pediatrician regarding the use of this medicine in children. Special care may be needed. Overdosage: If you think you have taken too much of this medicine contact a poison control center or emergency room at once. NOTE: This medicine is only for you. Do not share this medicine with others. What if I miss a dose? It is important not to miss your dose. Call your doctor or health care professional if you are unable to keep an appointment. What may interact with this medicine? Do not take this medicine with any of the following medications: -disulfiram -metronidazole This medicine may also interact with the following medications: -cyclosporine -diazepam -ketoconazole -medicines to increase blood counts like filgrastim, pegfilgrastim, sargramostim -other chemotherapy drugs like cisplatin,  doxorubicin, epirubicin, etoposide, teniposide, vincristine -quinidine -testosterone -vaccines -verapamil Talk to your doctor or health care professional before taking any of these medicines: -acetaminophen -aspirin -ibuprofen -ketoprofen -naproxen This list may not describe all possible interactions. Give your health care provider a list of all the medicines, herbs, non-prescription drugs, or dietary supplements you use. Also tell them if you smoke, drink alcohol, or use illegal drugs. Some items may interact with your medicine. What should I watch for while using this medicine? Your condition will be monitored carefully while you are receiving this medicine. You will need important blood work done while you are taking this medicine. This drug may make you feel generally unwell. This is not uncommon, as chemotherapy can affect healthy cells as well as cancer cells. Report any side effects. Continue your course of treatment even though you feel ill unless your doctor tells you to stop. In some cases, you may be given additional medicines to help with side effects. Follow all directions for their use. Call your doctor or health care professional for advice if you get a fever, chills or sore throat, or other symptoms of a cold or flu. Do not treat yourself. This drug decreases your body's ability to fight infections. Try to avoid being around people who are sick. This medicine may increase your risk to bruise or bleed. Call your doctor or health care professional if you notice any unusual bleeding. Be careful brushing and flossing your teeth or using a toothpick because you may get an infection or bleed more easily. If you have any dental work done, tell your dentist you are receiving this medicine. Avoid taking products that contain aspirin, acetaminophen, ibuprofen, naproxen, or ketoprofen unless instructed by your doctor. These medicines may hide a  fever. Do not become pregnant while taking this  medicine. Women should inform their doctor if they wish to become pregnant or think they might be pregnant. There is a potential for serious side effects to an unborn child. Talk to your health care professional or pharmacist for more information. Do not breast-feed an infant while taking this medicine. Men are advised not to father a child while receiving this medicine. What side effects may I notice from receiving this medicine? Side effects that you should report to your doctor or health care professional as soon as possible: -allergic reactions like skin rash, itching or hives, swelling of the face, lips, or tongue -low blood counts - This drug may decrease the number of white blood cells, red blood cells and platelets. You may be at increased risk for infections and bleeding. -signs of infection - fever or chills, cough, sore throat, pain or difficulty passing urine -signs of decreased platelets or bleeding - bruising, pinpoint red spots on the skin, black, tarry stools, nosebleeds -signs of decreased red blood cells - unusually weak or tired, fainting spells, lightheadedness -breathing problems -chest pain -high or low blood pressure -mouth sores -nausea and vomiting -pain, swelling, redness or irritation at the injection site -pain, tingling, numbness in the hands or feet -slow or irregular heartbeat -swelling of the ankle, feet, hands Side effects that usually do not require medical attention (report to your doctor or health care professional if they continue or are bothersome): -bone pain -complete hair loss including hair on your head, underarms, pubic hair, eyebrows, and eyelashes -changes in the color of fingernails -diarrhea -loosening of the fingernails -loss of appetite -muscle or joint pain -red flush to skin -sweating This list may not describe all possible side effects. Call your doctor for medical advice about side effects. You may report side effects to FDA at  1-800-FDA-1088. Where should I keep my medicine? This drug is given in a hospital or clinic and will not be stored at home. NOTE: This sheet is a summary. It may not cover all possible information. If you have questions about this medicine, talk to your doctor, pharmacist, or health care provider.  2015, Elsevier/Gold Standard. (2012-03-16 16:41:21)

## 2014-07-22 ENCOUNTER — Telehealth: Payer: Self-pay | Admitting: *Deleted

## 2014-07-22 ENCOUNTER — Telehealth: Payer: Self-pay

## 2014-07-22 NOTE — Telephone Encounter (Signed)
Pt called saying she was giving [place] a call back. The most recent message was for appts. Pt will ask husband and call back if it is something else.

## 2014-07-22 NOTE — Telephone Encounter (Signed)
Late entry for 1147: Pt left message stating, "I spoke with my family and I don't think I'll be able to go through with this treatment. Thank you for everything you've done to work this up." Pt agrees to keep follow up office visit. Returned call to pt, support given. She understands to call office if needed prior to next visit. Infusion and lab appointments canceled.

## 2014-07-26 ENCOUNTER — Ambulatory Visit: Payer: Medicare Other

## 2014-08-01 ENCOUNTER — Other Ambulatory Visit: Payer: Self-pay

## 2014-08-02 ENCOUNTER — Other Ambulatory Visit: Payer: Medicare Other

## 2014-08-02 ENCOUNTER — Ambulatory Visit: Payer: Medicare Other

## 2014-08-05 ENCOUNTER — Telehealth: Payer: Self-pay | Admitting: Nurse Practitioner

## 2014-08-05 ENCOUNTER — Other Ambulatory Visit: Payer: Self-pay | Admitting: *Deleted

## 2014-08-05 NOTE — Telephone Encounter (Signed)
Cancelled labs/chemo per 07/01 POF, kept MD visit only on 07/05.... KJ

## 2014-08-09 ENCOUNTER — Ambulatory Visit: Payer: Medicare Other

## 2014-08-09 ENCOUNTER — Ambulatory Visit (HOSPITAL_BASED_OUTPATIENT_CLINIC_OR_DEPARTMENT_OTHER): Payer: Medicare Other | Admitting: Nurse Practitioner

## 2014-08-09 ENCOUNTER — Telehealth: Payer: Self-pay | Admitting: Nurse Practitioner

## 2014-08-09 ENCOUNTER — Other Ambulatory Visit: Payer: Medicare Other

## 2014-08-09 VITALS — BP 132/74 | HR 102 | Temp 98.4°F | Resp 18 | Ht <= 58 in | Wt 82.1 lb

## 2014-08-09 DIAGNOSIS — K529 Noninfective gastroenteritis and colitis, unspecified: Secondary | ICD-10-CM | POA: Diagnosis not present

## 2014-08-09 DIAGNOSIS — M810 Age-related osteoporosis without current pathological fracture: Secondary | ICD-10-CM

## 2014-08-09 DIAGNOSIS — Z5111 Encounter for antineoplastic chemotherapy: Secondary | ICD-10-CM | POA: Diagnosis not present

## 2014-08-09 DIAGNOSIS — C775 Secondary and unspecified malignant neoplasm of intrapelvic lymph nodes: Secondary | ICD-10-CM | POA: Diagnosis not present

## 2014-08-09 DIAGNOSIS — C562 Malignant neoplasm of left ovary: Secondary | ICD-10-CM

## 2014-08-09 MED ORDER — TRAMADOL HCL 50 MG PO TABS: ORAL_TABLET | ORAL | Status: DC

## 2014-08-09 NOTE — Progress Notes (Addendum)
  [place] OFFICE PROGRESS NOTE   Diagnosis:  Ovarian cancer  INTERVAL HISTORY:   Bianca [name] returns as scheduled. She has decided against chemotherapy due to concern regarding potential side effects. She has had a few episodes of nausea/vomiting. No consistent nausea or vomiting. Bowels moving. She reports a good appetite. She reports increased pain at the low back. She takes tramadol as needed.  Objective:  Vital signs in last 24 hours:  Blood pressure 132/74, pulse 102, temperature 98.4 F (36.9 C), temperature source Oral, resp. rate 18, height 4' 8.25" (1.429 m), weight 82 lb 1.6 oz (37.24 kg), SpO2 100 %.    HEENT: No thrush. Resp: Distant breath sounds. Cardio: Regular rate and rhythm. GI: Nontender. No organomegaly. No mass. No apparent ascites. Vascular: No leg edema.   Lab Results:  Lab Results  Component Value Date   WBC 8.6 07/18/2014   HGB 12.7 07/18/2014   HCT 38.7 07/18/2014   MCV 97.3 07/18/2014   PLT 215 07/18/2014   NEUTROABS 6.9* 07/18/2014    Imaging:  No results found.  Medications: I have reviewed the patient's current medications.  Assessment/Plan: 1. Poorly different it adenocarcinoma involving a left adnexal mass with radiographic evidence of carcinomatosis, probable GYN primary  Status post 4 cycles of single agent carboplatin, last given 12/08/2013  CT of the abdomen/pelvis 11/29/2013, after 3 cycles of carboplatin, revealed improvement in the left adnexal mass and carcinomatosis  Therapy switch to tamoxifen on 01/07/2014  CT abdomen/pelvis 03/02/2014-left adnexal lesion and peritoneal lesions are smaller  CA 125 improved 03/02/2014  CA 125 higher 05/03/2014 and 06/28/2014  CT abdomen/pelvis 07/18/2014 revealed enlargement of the left adnexal mass and deep left pelvic implant   2. Oxygen dependent COPD  3. History of left lower extremity pain, likely secondary to benign musculoskeletal disease involving the  spine versus left pelvic sidewall tumor  4. Osteoporosis/compression fractures  5. History of C. difficile colitis   Disposition: Bianca [name] appears stable. She has decided against further chemotherapy. She will be followed with observation. She will return for a follow-up visit in 8-10 weeks. She will contact the office in the interim with any problems. She was given a new tramadol prescription at today's visit.  Patient seen with Dr. Benay Spice.      Ned Card ANP/GNP-BC   08/09/2014  8:56 AM  This was a shared visit with Ned Card. Bianca [name] has decided against salvage chemotherapy. We refilled her prescription for tramadol. She will return for an office visit in 2 months.  Julieanne Manson, M.D.

## 2014-08-09 NOTE — Telephone Encounter (Signed)
Pt confirmed MD visit per 07/05 POF, gave pt AVS and Calendar.... KJ

## 2014-08-16 ENCOUNTER — Ambulatory Visit: Payer: Medicare Other

## 2014-09-01 ENCOUNTER — Ambulatory Visit: Payer: Medicare Other | Admitting: Nurse Practitioner

## 2014-09-01 ENCOUNTER — Other Ambulatory Visit: Payer: Medicare Other

## 2014-09-14 ENCOUNTER — Telehealth: Payer: Self-pay | Admitting: *Deleted

## 2014-09-14 DIAGNOSIS — N838 Other noninflammatory disorders of ovary, fallopian tube and broad ligament: Secondary | ICD-10-CM

## 2014-09-14 DIAGNOSIS — C562 Malignant neoplasm of left ovary: Secondary | ICD-10-CM

## 2014-09-14 MED ORDER — ALBUTEROL SULFATE (2.5 MG/3ML) 0.083% IN NEBU: 2.5000 mg | INHALATION_SOLUTION | Freq: Four times a day (QID) | RESPIRATORY_TRACT | Status: AC | PRN

## 2014-09-14 MED ORDER — LORAZEPAM 0.5 MG PO TABS: ORAL_TABLET | ORAL | Status: DC

## 2014-09-14 MED ORDER — ALBUTEROL SULFATE HFA 108 (90 BASE) MCG/ACT IN AERS: 2.0000 | INHALATION_SPRAY | Freq: Four times a day (QID) | RESPIRATORY_TRACT | Status: AC | PRN

## 2014-09-14 NOTE — Telephone Encounter (Signed)
Call from Newman Nickels, RN with hospice requesting refills on Ativan and Albuterol. Dr. Benay Spice has agreed to be attending MD for hospice. Rx sent to pharmacy, Pam made aware.

## 2014-09-20 ENCOUNTER — Other Ambulatory Visit: Payer: Self-pay | Admitting: Nurse Practitioner

## 2014-09-22 NOTE — Telephone Encounter (Signed)
NOTIFIED PT. THAT PRESCRIPTION HAS BEEN CALLED TO PHARMACY.

## 2014-10-02 ENCOUNTER — Other Ambulatory Visit: Payer: Self-pay | Admitting: Oncology

## 2014-10-04 ENCOUNTER — Telehealth: Payer: Self-pay | Admitting: Oncology

## 2014-10-04 ENCOUNTER — Ambulatory Visit (HOSPITAL_BASED_OUTPATIENT_CLINIC_OR_DEPARTMENT_OTHER): Payer: Medicare Other | Admitting: Oncology

## 2014-10-04 VITALS — BP 162/86 | HR 102 | Temp 98.9°F | Resp 18 | Ht <= 58 in | Wt 81.4 lb

## 2014-10-04 DIAGNOSIS — Z23 Encounter for immunization: Secondary | ICD-10-CM

## 2014-10-04 DIAGNOSIS — J449 Chronic obstructive pulmonary disease, unspecified: Secondary | ICD-10-CM

## 2014-10-04 DIAGNOSIS — M810 Age-related osteoporosis without current pathological fracture: Secondary | ICD-10-CM | POA: Diagnosis not present

## 2014-10-04 DIAGNOSIS — C562 Malignant neoplasm of left ovary: Secondary | ICD-10-CM

## 2014-10-04 DIAGNOSIS — C775 Secondary and unspecified malignant neoplasm of intrapelvic lymph nodes: Secondary | ICD-10-CM

## 2014-10-04 MED ORDER — INFLUENZA VAC SPLIT QUAD 0.5 ML IM SUSY
0.5000 mL | PREFILLED_SYRINGE | Freq: Once | INTRAMUSCULAR | Status: AC
  Administered 2014-10-04: 0.5 mL via INTRAMUSCULAR
  Filled 2014-10-04: qty 0.5

## 2014-10-04 MED ORDER — SENNOSIDES-DOCUSATE SODIUM 8.6-50 MG PO TABS: 1.0000 | ORAL_TABLET | Freq: Two times a day (BID) | ORAL | Status: AC | PRN

## 2014-10-04 MED ORDER — HYDROCODONE-ACETAMINOPHEN 5-325 MG PO TABS: 1.0000 | ORAL_TABLET | Freq: Four times a day (QID) | ORAL | Status: DC | PRN

## 2014-10-04 NOTE — Progress Notes (Signed)
  [place] OFFICE PROGRESS NOTE   Diagnosis: Ovarian cancer  INTERVAL HISTORY:   Ms. Eickhoff returns as scheduled. She decided against salvage chemotherapy and elected to enroll in the [facility] program. The hospice nurse is visiting weekly. She reports persistent pain in the left iliac region. She takes tramadol during the day. She would like something stronger at night. Good appetite. She reports constipation followed by diarrhea. She is not taking a stool softener.  Objective:  Vital signs in last 24 hours:  Blood pressure 162/86, pulse 102, temperature 98.9 F (37.2 C), temperature source Oral, resp. rate 18, height 4' 8.25" (1.429 m), weight 81 lb 6.4 oz (36.923 kg), SpO2 94 %.   Resp: Distant breath sounds, no respiratory distress Cardio: Regular rate and rhythm GI: No hepatomegaly, no apparent ascites, nontender, no mass Vascular: No leg edema     Medications: I have reviewed the patient's current medications.  Assessment/Plan: 1. Poorly different it adenocarcinoma involving a left adnexal mass with radiographic evidence of carcinomatosis, probable GYN primary  Status post 4 cycles of single agent carboplatin, last given 12/08/2013  CT of the abdomen/pelvis 11/29/2013, after 3 cycles of carboplatin, revealed improvement in the left adnexal mass and carcinomatosis  Therapy switch to tamoxifen on 01/07/2014  CT abdomen/pelvis 03/02/2014-left adnexal lesion and peritoneal lesions are smaller  CA 125 improved 03/02/2014  CA 125 higher 05/03/2014 and 06/28/2014  CT abdomen/pelvis 07/18/2014 revealed enlargement of the left adnexal mass and deep left pelvic implant   2. Oxygen dependent COPD  3. History of left lower extremity pain, likely secondary to benign musculoskeletal disease involving the spine versus left pelvic sidewall tumor  4. Osteoporosis/compression fractures  5. History of C. difficile  colitis     Disposition:  Ms. Heagle has advanced ovarian cancer and COPD. She is now enrolled in the [facility] program. She confirmed her desire for a no CODE BLUE status. She will take hydrocodone as needed for pain in the evening. We recommended she begin Senokot in an attempt to regulate her bowel habits. If this does not help she will try MiraLAX.  Ms. Meller received an influenza vaccine today. She will return for an office visit and pneumonia vaccine in 6 weeks. She will contact us in the interim as needed.   Betsy Coder, MD  10/04/2014  12:51 PM

## 2014-10-04 NOTE — Telephone Encounter (Signed)
Gave adn pritned appt sched and avs for pt for Sept °

## 2014-10-31 ENCOUNTER — Other Ambulatory Visit: Payer: Self-pay | Admitting: Oncology

## 2014-11-07 ENCOUNTER — Other Ambulatory Visit: Payer: Self-pay | Admitting: *Deleted

## 2014-11-07 DIAGNOSIS — C562 Malignant neoplasm of left ovary: Secondary | ICD-10-CM

## 2014-11-07 MED ORDER — HYDROCODONE-ACETAMINOPHEN 5-325 MG PO TABS: 1.0000 | ORAL_TABLET | Freq: Four times a day (QID) | ORAL | Status: DC | PRN

## 2014-11-07 NOTE — Telephone Encounter (Signed)
Per request from [facility] RN; Hydrocodone refill script faxed in to [facility] pharmacy.

## 2014-11-11 ENCOUNTER — Emergency Department (HOSPITAL_COMMUNITY)
Admission: EM | Admit: 2014-11-11 | Discharge: 2014-11-11 | Disposition: A | Discharge status: Home / Self Care | Attending: Physician Assistant | Admitting: Physician Assistant

## 2014-11-11 ENCOUNTER — Encounter (HOSPITAL_COMMUNITY): Payer: Self-pay | Admitting: *Deleted

## 2014-11-11 ENCOUNTER — Emergency Department (HOSPITAL_COMMUNITY)

## 2014-11-11 DIAGNOSIS — R1084 Generalized abdominal pain: Secondary | ICD-10-CM | POA: Diagnosis not present

## 2014-11-11 DIAGNOSIS — Z8543 Personal history of malignant neoplasm of ovary: Secondary | ICD-10-CM | POA: Insufficient documentation

## 2014-11-11 DIAGNOSIS — Z880 Allergy status to penicillin: Secondary | ICD-10-CM | POA: Insufficient documentation

## 2014-11-11 DIAGNOSIS — K5900 Constipation, unspecified: Secondary | ICD-10-CM | POA: Diagnosis present

## 2014-11-11 DIAGNOSIS — Z87891 Personal history of nicotine dependence: Secondary | ICD-10-CM | POA: Insufficient documentation

## 2014-11-11 DIAGNOSIS — Z7952 Long term (current) use of systemic steroids: Secondary | ICD-10-CM | POA: Diagnosis not present

## 2014-11-11 DIAGNOSIS — Z8639 Personal history of other endocrine, nutritional and metabolic disease: Secondary | ICD-10-CM | POA: Diagnosis not present

## 2014-11-11 DIAGNOSIS — Z79899 Other long term (current) drug therapy: Secondary | ICD-10-CM | POA: Insufficient documentation

## 2014-11-11 DIAGNOSIS — R531 Weakness: Secondary | ICD-10-CM | POA: Insufficient documentation

## 2014-11-11 DIAGNOSIS — R5383 Other fatigue: Secondary | ICD-10-CM | POA: Insufficient documentation

## 2014-11-11 DIAGNOSIS — I10 Essential (primary) hypertension: Secondary | ICD-10-CM | POA: Diagnosis not present

## 2014-11-11 DIAGNOSIS — J449 Chronic obstructive pulmonary disease, unspecified: Secondary | ICD-10-CM | POA: Insufficient documentation

## 2014-11-11 LAB — URINALYSIS, ROUTINE W REFLEX MICROSCOPIC
Bilirubin Urine: NEGATIVE
Glucose, UA: NEGATIVE mg/dL
HGB URINE DIPSTICK: NEGATIVE
Ketones, ur: NEGATIVE mg/dL
NITRITE: NEGATIVE
PROTEIN: NEGATIVE mg/dL
Specific Gravity, Urine: 1.01 (ref 1.005–1.030)
UROBILINOGEN UA: 1 mg/dL (ref 0.0–1.0)
pH: 6.5 (ref 5.0–8.0)

## 2014-11-11 LAB — COMPREHENSIVE METABOLIC PANEL
ALBUMIN: 4.2 g/dL (ref 3.5–5.0)
ALT: 7 U/L — ABNORMAL LOW (ref 14–54)
AST: 23 U/L (ref 15–41)
Alkaline Phosphatase: 56 U/L (ref 38–126)
Anion gap: 11 (ref 5–15)
BUN: 16 mg/dL (ref 6–20)
CHLORIDE: 95 mmol/L — AB (ref 101–111)
CO2: 30 mmol/L (ref 22–32)
Calcium: 9.3 mg/dL (ref 8.9–10.3)
Creatinine, Ser: 0.43 mg/dL — ABNORMAL LOW (ref 0.44–1.00)
GFR calc Af Amer: >60 mL/min (ref >60)
GFR calc non Af Amer: >60 mL/min (ref >60)
GLUCOSE: 92 mg/dL (ref 65–99)
POTASSIUM: 4.4 mmol/L (ref 3.5–5.1)
SODIUM: 136 mmol/L (ref 135–145)
Total Bilirubin: 0.6 mg/dL (ref 0.3–1.2)
Total Protein: 8.7 g/dL — ABNORMAL HIGH (ref 6.5–8.1)

## 2014-11-11 LAB — CBC WITH DIFFERENTIAL/PLATELET
BASOS PCT: 0 %
Basophils Absolute: 0 10*3/uL (ref 0.0–0.1)
Eosinophils Absolute: 0.1 10*3/uL (ref 0.0–0.7)
Eosinophils Relative: 1 %
HCT: 40.9 % (ref 36.0–46.0)
Hemoglobin: 12.9 g/dL (ref 12.0–15.0)
Lymphocytes Relative: 14 %
Lymphs Abs: 1.2 10*3/uL (ref 0.7–4.0)
MCH: 31.7 pg (ref 26.0–34.0)
MCHC: 31.5 g/dL (ref 30.0–36.0)
MCV: 100.5 fL — ABNORMAL HIGH (ref 78.0–100.0)
MONO ABS: 0.6 10*3/uL (ref 0.1–1.0)
MONOS PCT: 7 %
Neutro Abs: 6.6 10*3/uL (ref 1.7–7.7)
Neutrophils Relative %: 78 %
PLATELETS: 267 10*3/uL (ref 150–400)
RBC: 4.07 MIL/uL (ref 3.87–5.11)
RDW: 13 % (ref 11.5–15.5)
WBC: 8.5 10*3/uL (ref 4.0–10.5)

## 2014-11-11 LAB — I-STAT CG4 LACTIC ACID, ED: Lactic Acid, Venous: 0.74 mmol/L (ref 0.5–2.0)

## 2014-11-11 LAB — URINE MICROSCOPIC-ADD ON

## 2014-11-11 LAB — LIPASE, BLOOD: Lipase: 23 U/L (ref 22–51)

## 2014-11-11 MED ORDER — SODIUM CHLORIDE 0.9 % IV BOLUS (SEPSIS)
1000.0000 mL | Freq: Once | INTRAVENOUS | Status: AC
  Administered 2014-11-11: 1000 mL via INTRAVENOUS

## 2014-11-11 MED ORDER — IOHEXOL 300 MG/ML  SOLN
50.0000 mL | Freq: Once | INTRAMUSCULAR | Status: DC | PRN
  Administered 2014-11-11: 50 mL via ORAL
  Filled 2014-11-11: qty 50

## 2014-11-11 MED ORDER — POLYETHYLENE GLYCOL 3350 17 G PO PACK: 17.0000 g | PACK | Freq: Every day | ORAL | Status: AC

## 2014-11-11 MED ORDER — SODIUM CHLORIDE 0.9 % IV BOLUS (SEPSIS)
1000.0000 mL | Freq: Once | INTRAVENOUS | Status: AC
Start: 2014-11-11 — End: 2014-11-11
  Administered 2014-11-11: 1000 mL via INTRAVENOUS

## 2014-11-11 MED ORDER — IOHEXOL 300 MG/ML  SOLN
100.0000 mL | Freq: Once | INTRAMUSCULAR | Status: AC | PRN
  Administered 2014-11-11: 80 mL via INTRAVENOUS

## 2014-11-11 NOTE — ED Notes (Signed)
Pt back from CT

## 2014-11-11 NOTE — Discharge Instructions (Signed)
Start taking miralax daily. Please follow-up with her primary care doctor for recheck and further evaluation. If develop worsening respiratory symptoms, please follow-up with her primary care doctor, a CT scan did show an area in your lung that is most likely chronic but could suggest early infection. Return if worsening.

## 2014-11-11 NOTE — ED Provider Notes (Signed)
CSN: [id number]     Arrival date & time 11/11/14  1023 History   First MD Initiated Contact with Patient 11/11/14 1100     Chief Complaint  Patient presents with  . Weakness  . Constipation     (Consider location/radiation/quality/duration/timing/severity/associated sxs/prior Treatment) HPI Bianca [name] is a 81 y.o. female with history of ovarian cancer with metastases to abdomen, presents to emergency department complaining of constipation, decreased urination, weakness. Patient states she hasn't been able to have a good bowel movement in over a week. She reports this could be due to pain medications that she is on. She states that the reason she came to emergency department is because now she is having decreased urination. She states she only urinated once yesterday and was a small amount. She denies any dysuria. She denies any fever or chills. She does report abdominal discomfort. No flank pain. No nausea or vomiting. Patient says she feels weak and dehydrated. States she is eating and drinking, however not as much as normal.  Past Medical History  Diagnosis Date  . DIVERTICULOSIS, COLON   . VITAMIN D DEFICIENCY   . HYPERLIPIDEMIA   . HYPERTENSION   . OSTEOPOROSIS   . COPD (chronic obstructive pulmonary disease) ([place])     PFTs 02/10/12: mild-mod  . Cancer [facility])     ovarian ca   Past Surgical History  Procedure Laterality Date  . Cystoscopy  07/2005  . Vertebroplasty  03/2005    Dr. Vernard Gambles   Family History  Problem Relation Age of Onset  . Heart disease Mother   . Hypertension Mother   . Heart disease Father   . Hypertension Father    Social History  Substance Use Topics  . Smoking status: Former Smoker -- 0.30 packs/day for 31 years    Types: Cigarettes    Start date: 02/04/1950    Quit date: 02/04/1981  . Smokeless tobacco: Never Used     Comment: Married, lives with spouse. retired  . Alcohol Use: 1.2 oz/week    2 Glasses of wine per week   OB History    No  data available     Review of Systems  Constitutional: Positive for fatigue. Negative for fever and chills.  Respiratory: Negative for cough, chest tightness and shortness of breath.   Cardiovascular: Negative for chest pain, palpitations and leg swelling.  Gastrointestinal: Positive for abdominal pain and constipation. Negative for nausea, vomiting and diarrhea.  Genitourinary: Negative for dysuria, urgency, frequency, flank pain and pelvic pain.       Decreased urination  Musculoskeletal: Negative for myalgias, arthralgias, neck pain and neck stiffness.  Skin: Negative for rash.  Neurological: Positive for weakness. Negative for dizziness and headaches.  All other systems reviewed and are negative.     Allergies  Aspirin; Codeine; Penicillins; Shrimp; and Tomato  Home Medications   Prior to Admission medications   Medication Sig Start Date End Date Taking? Authorizing Provider  acetaminophen (TYLENOL) 325 MG tablet Take 650 mg by mouth every 6 (six) hours as needed.    Historical Provider, MD  albuterol (PROVENTIL HFA;VENTOLIN HFA) 108 (90 BASE) MCG/ACT inhaler Inhale 2 puffs into the lungs every 6 (six) hours as needed for wheezing or shortness of breath. 09/14/14   Ladell Pier, MD  albuterol (PROVENTIL) (2.5 MG/3ML) 0.083% nebulizer solution Take 3 mLs (2.5 mg total) by nebulization every 6 (six) hours as needed for wheezing or shortness of breath. 09/14/14   Ladell Pier, MD  denosumab (  PROLIA) 60 MG/ML SOLN injection Inject 60 mg into the skin every 6 (six) months. Administer in upper arm, thigh, or abdomen. Dr Kathrin Penner office 04/15/11   Rowe Clack, MD  dexamethasone (DECADRON) 4 MG tablet Take 2.5 tablets (10 mg total) by mouth once. Take 10 mg at 10 pm night before 1st chemo, and 10 mg at 6 am the morning of 1st chemo Patient not taking: Reported on 08/09/2014 07/20/14   Ladell Pier, MD  HYDROcodone-acetaminophen (NORCO/VICODIN) 5-325 MG tablet Take 1 tablet by  mouth every 6 (six) hours as needed for moderate pain. 11/07/14   Ladell Pier, MD  loperamide (IMODIUM A-D) 2 MG tablet Take 2 mg by mouth as needed for diarrhea or loose stools.    Historical Provider, MD  LORazepam (ATIVAN) 0.5 MG tablet Take one half tablet (0.25 mg) by mouth or under the tongue every six hours as needed for nausea. 09/14/14   Ladell Pier, MD  Multiple Vitamins-Minerals (MULTI-VITAMIN GUMMIES) CHEW Chew 1 tablet by mouth daily.     Historical Provider, MD  ondansetron (ZOFRAN) 4 MG tablet Take 4 mg by mouth every 8 (eight) hours as needed for nausea or vomiting.    Historical Provider, MD  senna-docusate (SENOKOT-S) 8.6-50 MG per tablet Take 1 tablet by mouth 2 (two) times daily as needed. 10/04/14   Ladell Pier, MD  tiotropium (SPIRIVA) 18 MCG inhalation capsule Place 1 capsule (18 mcg total) into inhaler and inhale daily as needed (Shortness of breath). Patient not taking: Reported on 08/09/2014 02/04/14   Shawnee Knapp, MD  traMADol (ULTRAM) 50 MG tablet TAKE 1-2 TABLETS BY MOUTH EVERY 8 HOURS AS NEEDED FOR MODERATE PAIN 11/01/14   Ladell Pier, MD  trolamine salicylate (ASPERCREME) 10 % cream Apply 1 application topically as needed for muscle pain.    Historical Provider, MD   BP 163/82 mmHg  Pulse 94  Temp(Src) 97.4 F (36.3 C) (Oral)  Resp 18  SpO2 93% Physical Exam  Constitutional: She is oriented to person, place, and time. She appears well-developed and well-nourished. No distress.  HENT:  Head: Normocephalic.  Eyes: Conjunctivae are normal.  Neck: Neck supple.  Cardiovascular: Normal rate, regular rhythm and normal heart sounds.   Pulmonary/Chest: Effort normal and breath sounds normal. No respiratory distress. She has no wheezes. She has no rales.  Abdominal: Soft. Bowel sounds are normal. She exhibits no distension. There is tenderness. There is no rebound.  Diffusely tender  Musculoskeletal: She exhibits no edema.  Neurological: She is alert and  oriented to person, place, and time.  Skin: Skin is warm and dry.  Psychiatric: She has a normal mood and affect. Her behavior is normal.  Nursing note and vitals reviewed.   ED Course  Procedures (including critical care time) Labs Review Labs Reviewed  CBC WITH DIFFERENTIAL/PLATELET - Abnormal; Notable for the following:    MCV 100.5 (*)    All other components within normal limits  COMPREHENSIVE METABOLIC PANEL - Abnormal; Notable for the following:    Chloride 95 (*)    Creatinine, Ser 0.43 (*)    Total Protein 8.7 (*)    ALT 7 (*)    All other components within normal limits  URINALYSIS, ROUTINE W REFLEX MICROSCOPIC (NOT AT [facility]) - Abnormal; Notable for the following:    Leukocytes, UA MODERATE (*)    All other components within normal limits  URINE MICROSCOPIC-ADD ON - Abnormal; Notable for the following:    Squamous  Epithelial / LPF FEW (*)    All other components within normal limits  URINE CULTURE  LIPASE, BLOOD  I-STAT CG4 LACTIC ACID, ED  I-STAT CG4 LACTIC ACID, ED    Imaging Review Ct Abdomen Pelvis W Contrast  11/11/2014   CLINICAL DATA:  Diffuse abdominal pain  EXAM: CT ABDOMEN AND PELVIS WITH CONTRAST  TECHNIQUE: Multidetector CT imaging of the abdomen and pelvis was performed using the standard protocol following bolus administration of intravenous contrast.  CONTRAST:  16mL OMNIPAQUE IOHEXOL 300 MG/ML  SOLN  COMPARISON:  07/18/2014  FINDINGS: There is scarring at the right lung base. Associated consolidation at the posterior costophrenic angle has increased. There is mucoid filling of airways towards the posterior basal segment of the right lower lobe. This is worrisome for aspiration or endobronchial lesion.  Calcified granuloma in the liver  Gallbladder, spleen, adrenal glands are within normal limits  Pancreas is atrophic.  Stable hypodensities in the kidneys.  Stable left adnexal mass measuring 3.7 x 2.8 cm. Deep left pelvic implant has increased in size and now  measures 2.0 cm. Uterus and right adnexa are stable in appearance. Bladder is distended.  Atherosclerotic calcifications throughout the aorta and iliac vessels.  Left ovarian vein is distended, reflux is with contrast, and extends to left pelvic varices.  Small amount of free fluid in the right hemipelvis.  Stable lumbar spine with compression fractures at L2, L3, and L4. T12 vertebroplasty changes are also noted.  IMPRESSION: Stable left adnexal mass. Deep left pelvic implant has increased in size from 1.7 cm to 2.0 cm. Worsening malignancy is not excluded.  Increasing consolidation at the posterior right lung base associated with mucoid filling of the airways. This findings worrisome for aspiration pneumonia or endobronchial lesion. Consider bronchoscopy.   Electronically Signed   By: Marybelle Killings M.D.   On: 11/11/2014 15:20   I have personally reviewed and evaluated these images and lab results as part of my medical decision-making.   EKG Interpretation None      MDM   Final diagnoses:  Generalized abdominal pain    Patient with history of ovarian cancer with metastasis to the abdomen presents to emergency department complaining of possible constipation, weakness, decreased urinary output. Patient has diffuse tenderness on abdominal exam. Vital signs show hypertension otherwise unremarkable. Patient is not vomiting. Will get labs, CT abdomen and pelvis to rule out obstruction, will get urinalysis.  4:08 PM  Labs are unremarkable. A urinalysis shows moderate leukocytes however rare bacteria in 7-10 white blood cells. Samples mildly contaminated with few squamous epithelial cells. We will send urinalysis for culture, doubt infection. Patient is able to urinate and has urinated several times while in the emergency department. CT of the abdomen and pelvis showed possible worsening metastases, with possible aspiration pneumonia versus metastases to lung. Patient has no new lung complaints. She is  chronically on oxygen. No fever or chills here. Will refer back to the primary care doctor. Patient wants to go home. We'll discharge home with close outpatient follow-up.  Filed Vitals:   11/11/14 1038 11/11/14 1312 11/11/14 1534 11/11/14 1627  BP: 132/71 163/82  154/82  Pulse: 90 94 91 93  Temp: 97.7 F (36.5 C) 97.4 F (36.3 C) 98.3 F (36.8 C) 98.6 F (37 C)  TempSrc: Oral Oral Oral Oral  Resp: 16 18 18 18   SpO2: 100% 93%  100%     Jeannett Senior, PA-C 11/11/14 [place], MD 11/14/14 1559

## 2014-11-11 NOTE — ED Notes (Signed)
Constipation Decreased urinary output Generalized weakness

## 2014-11-11 NOTE — ED Notes (Signed)
Patient transported to CT 

## 2014-11-11 NOTE — ED Notes (Signed)
Bed: OK59 Expected date:  Expected time:  Means of arrival:  Comments: Constipation, weakness

## 2014-11-13 LAB — URINE CULTURE

## 2014-11-15 ENCOUNTER — Telehealth: Payer: Self-pay | Admitting: *Deleted

## 2014-11-15 ENCOUNTER — Ambulatory Visit (HOSPITAL_BASED_OUTPATIENT_CLINIC_OR_DEPARTMENT_OTHER): Admitting: Oncology

## 2014-11-15 ENCOUNTER — Telehealth: Payer: Self-pay | Admitting: Oncology

## 2014-11-15 ENCOUNTER — Ambulatory Visit

## 2014-11-15 VITALS — BP 148/86 | HR 96 | Temp 98.0°F | Resp 18 | Ht <= 58 in | Wt 79.9 lb

## 2014-11-15 DIAGNOSIS — J449 Chronic obstructive pulmonary disease, unspecified: Secondary | ICD-10-CM | POA: Diagnosis not present

## 2014-11-15 DIAGNOSIS — C562 Malignant neoplasm of left ovary: Secondary | ICD-10-CM | POA: Diagnosis not present

## 2014-11-15 DIAGNOSIS — M899 Disorder of bone, unspecified: Secondary | ICD-10-CM

## 2014-11-15 NOTE — Telephone Encounter (Signed)
Gave adn printed appt sched and avs fo rpt for NOV °

## 2014-11-15 NOTE — Progress Notes (Signed)
Pt seen by Dr Benay Spice. No injection needed.

## 2014-11-15 NOTE — Progress Notes (Signed)
  [name] OFFICE PROGRESS NOTE   Diagnosis: Ovarian cancer  INTERVAL HISTORY:   [name] returns as scheduled. She is followed by the [facility] program. She continues to have pain at the left pelvis. She was seen in the emergency room on 11/11/2014 with constipation. A CT of the abdomen and pelvis showed a stable left adnexal mass and slight enlargement of a deep left pelvic implant. Consolidation was noted at the posterior right lung base concerning for aspiration pneumonia or an endobronchial lesion. She denies new respiratory symptoms.  Objective:  Vital signs in last 24 hours:  Blood pressure 148/86, pulse 96, temperature 98 F (36.7 C), temperature source Oral, resp. rate 18, height 4' 8.25" (1.429 m), weight 79 lb 14.4 oz (36.242 kg), SpO2 94 %.    Resp: Bronchial sounds at the left upper posterior chest, distant breath sounds, no respiratory distress Cardio: Regular rate and rhythm GI: No hepatomegaly, no mass, no apparent ascites, nontender Vascular: No leg edema   Lab Results:  Lab Results  Component Value Date   WBC 8.5 11/11/2014   HGB 12.9 11/11/2014   HCT 40.9 11/11/2014   MCV 100.5* 11/11/2014   PLT 267 11/11/2014   NEUTROABS 6.6 11/11/2014     Medications: I have reviewed the patient's current medications.  Assessment/Plan: 1. Poorly different it adenocarcinoma involving a left adnexal mass with radiographic evidence of carcinomatosis, probable GYN primary  Status post 4 cycles of single agent carboplatin, last given 12/08/2013  CT of the abdomen/pelvis 11/29/2013, after 3 cycles of carboplatin, revealed improvement in the left adnexal mass and carcinomatosis  Therapy switch to tamoxifen on 01/07/2014  CT abdomen/pelvis 03/02/2014-left adnexal lesion and peritoneal lesions are smaller  CA 125 improved 03/02/2014  CA 125 higher 05/03/2014 and 06/28/2014  CT abdomen/pelvis 07/18/2014 revealed enlargement of the left  adnexal mass and deep left pelvic implant  CT abdomen/pelvis 11/11/2014 with a stable left adnexal mass, slight enlargement of a deep pelvic implant, increased consolidation in the right lower lung   2. Oxygen dependent COPD  3. History of left lower extremity pain, likely secondary to benign musculoskeletal disease involving the spine versus left pelvic sidewall tumor  4. Osteoporosis/compression fractures  5. History of C. difficile colitis     Disposition:  Bianca [name] appears stable. I reviewed the 11/11/2014 CT images with Ms. Glaus and her daughter. She does not wish to undergo further evaluation of the lung findings. She will contact us for a fever or increased cough. She will continue follow-up with the [facility] program. She will return for an office visit 12/27/2014.  Betsy Coder, MD  11/15/2014  4:57 PM

## 2014-11-15 NOTE — Telephone Encounter (Signed)
"  I need appointment confirmation.  I never heard anything from the office.  When am I to see Dr. Benay Spice?" Informed patient appointment is today at 12:00 pm.

## 2014-11-29 ENCOUNTER — Telehealth: Payer: Self-pay | Admitting: Family Medicine

## 2014-12-19 ENCOUNTER — Telehealth: Payer: Self-pay | Admitting: *Deleted

## 2014-12-19 NOTE — Telephone Encounter (Signed)
Message from pt requesting to cancel 11/22 office visit. She reports she is moving, does not need this appointment. Hospice continues to follow her. She does not wish to reschedule this appointment. Thanks Dr. Benay Spice for caring for her- stated, "I just love that doctor, he always makes me feel better after talking to him." She understands to call us or hospice RN as needed.

## 2014-12-27 ENCOUNTER — Telehealth: Payer: Self-pay | Admitting: *Deleted

## 2014-12-27 ENCOUNTER — Ambulatory Visit: Payer: PRIVATE HEALTH INSURANCE | Admitting: Oncology

## 2014-12-27 DIAGNOSIS — C562 Malignant neoplasm of left ovary: Secondary | ICD-10-CM

## 2014-12-27 MED ORDER — HYDROCODONE-ACETAMINOPHEN 5-325 MG PO TABS: 1.0000 | ORAL_TABLET | Freq: Four times a day (QID) | ORAL | Status: DC | PRN

## 2014-12-27 NOTE — Telephone Encounter (Signed)
Call from patient's son-n-law Delsa Sale requesting "refill on Hydrocodone-acetaminophen 5-325 mg for Bianca [name].  She also would like 100 pills instead of the sixty.   My understanding is Dr. Benay Spice said he'll be glad to increase the quantity.  No change in pain level she just needs to take it on a more regular basis."

## 2014-12-27 NOTE — Telephone Encounter (Signed)
Faxed narcotic Rx to [facility]. Spoke with pt, informed her of genetic counseling recommendation. She is agreeable to a genetics visit.

## 2014-12-28 ENCOUNTER — Telehealth: Payer: Self-pay | Admitting: Oncology

## 2014-12-28 NOTE — Telephone Encounter (Signed)
s.w. pt she did not want to sched genetics not felling well

## 2014-12-30 ENCOUNTER — Telehealth: Payer: Self-pay | Admitting: *Deleted

## 2014-12-30 NOTE — Telephone Encounter (Signed)
Call from pt reporting worsening pelvic pain. Stated Hydrocodone is not helping. She increased dose to 2 tablets which helped a little. She has not contacted hospice. Dr. Benay Spice is out of the office, instructed pt to contact hospice. She voiced understanding. Reviewed with Drue Second, NP.

## 2015-01-03 ENCOUNTER — Telehealth: Payer: Self-pay | Admitting: *Deleted

## 2015-01-03 NOTE — Telephone Encounter (Signed)
Hospice RN notifying office that pt is moving to independent living but will still be followed with Hospice; need to confirm if Dr. Benay Spice will continue to be attending MD.  Per Dr. Benay Spice, notified hospice RN that Dr. Benay Spice will be attending MD.  Hospice RN verbalized understanding.

## 2015-01-05 ENCOUNTER — Other Ambulatory Visit: Payer: Self-pay | Admitting: Family Medicine

## 2015-01-06 ENCOUNTER — Telehealth: Payer: Self-pay | Admitting: Genetic Counselor

## 2015-01-06 NOTE — Telephone Encounter (Signed)
PT DECLINED GENETIC COUNSELING REFERRAL AT THIS TIME.  WILL CONTACT OFFICE WHEN READY

## 2015-01-13 ENCOUNTER — Other Ambulatory Visit: Payer: Self-pay | Admitting: *Deleted

## 2015-01-13 DIAGNOSIS — C562 Malignant neoplasm of left ovary: Secondary | ICD-10-CM

## 2015-01-13 DIAGNOSIS — N838 Other noninflammatory disorders of ovary, fallopian tube and broad ligament: Secondary | ICD-10-CM

## 2015-01-13 MED ORDER — LORAZEPAM 0.5 MG PO TABS: ORAL_TABLET | ORAL | Status: AC

## 2015-01-13 MED ORDER — TRAMADOL HCL 50 MG PO TABS: ORAL_TABLET | ORAL | Status: AC

## 2015-01-19 ENCOUNTER — Telehealth: Payer: Self-pay | Admitting: *Deleted

## 2015-01-19 NOTE — Telephone Encounter (Addendum)
Received [employer] [facility] Physician's Report. Called pt, informed her this form is more appropriate for primary MD to fill out. She reports she does not have a PCP. Daughter requests referral to PCP. Pt and husband have moved in to [facility] and her husband is under care of the provider there. Pt is not. Pt is being followed by Hospice, they are seeing her weekly. Orders entered for office visit, pt will need to be seen to fill out these forms. She is in agreement.

## 2015-01-20 ENCOUNTER — Telehealth: Payer: Self-pay | Admitting: Oncology

## 2015-01-20 NOTE — Telephone Encounter (Signed)
s.w. pt and advised on Jan 2017 appt.....pt ok and aware °

## 2015-02-02 ENCOUNTER — Telehealth: Payer: Self-pay | Admitting: *Deleted

## 2015-02-02 NOTE — Telephone Encounter (Signed)
TC from pt's daughter stating that pt is experiencing increased pelvic pain and current pain meds are not helping. Pt's bowels are moving ok, but pt does not have much appetite.  Please call daughter with recommendations/ change in pain meds.

## 2015-02-02 NOTE — Telephone Encounter (Signed)
Per Dr. Benay Spice; notified pt's daughter that she can increase her hydrocodone to 2 tab every 6 hours as needed and also can increase Tramadol to every 6 hours as needed; call tomorrow afternoon with update.  Pt's daughter verbalized understanding and expressed appreciation for call back.

## 2015-02-03 ENCOUNTER — Other Ambulatory Visit: Payer: Self-pay | Admitting: *Deleted

## 2015-02-03 NOTE — Telephone Encounter (Signed)
Patient dtr Page left message on voicemail this morning requesting appointment with Dr. Benay Spice ASAP due to pain. Forwarded dtr's message to desk nurse and also routed this message to desk nurse and Dr. Benay Spice.

## 2015-02-03 NOTE — Telephone Encounter (Signed)
Spoke with dtr Page re appointment for 02/08/15 @ 11:15 am - date/time per 12/30 pof.

## 2015-02-07 NOTE — Telephone Encounter (Signed)
ERROR

## 2015-02-08 ENCOUNTER — Telehealth: Payer: Self-pay | Admitting: Oncology

## 2015-02-08 ENCOUNTER — Other Ambulatory Visit: Payer: Self-pay | Admitting: Oncology

## 2015-02-08 ENCOUNTER — Other Ambulatory Visit (HOSPITAL_BASED_OUTPATIENT_CLINIC_OR_DEPARTMENT_OTHER)

## 2015-02-08 ENCOUNTER — Other Ambulatory Visit: Payer: Self-pay | Admitting: *Deleted

## 2015-02-08 ENCOUNTER — Ambulatory Visit (HOSPITAL_BASED_OUTPATIENT_CLINIC_OR_DEPARTMENT_OTHER): Admitting: Oncology

## 2015-02-08 VITALS — BP 137/73 | HR 95 | Temp 98.8°F | Resp 17 | Ht <= 58 in | Wt 77.0 lb

## 2015-02-08 DIAGNOSIS — C801 Malignant (primary) neoplasm, unspecified: Secondary | ICD-10-CM | POA: Diagnosis not present

## 2015-02-08 DIAGNOSIS — C562 Malignant neoplasm of left ovary: Secondary | ICD-10-CM

## 2015-02-08 DIAGNOSIS — R338 Other retention of urine: Secondary | ICD-10-CM

## 2015-02-08 DIAGNOSIS — C7989 Secondary malignant neoplasm of other specified sites: Secondary | ICD-10-CM

## 2015-02-08 DIAGNOSIS — M810 Age-related osteoporosis without current pathological fracture: Secondary | ICD-10-CM

## 2015-02-08 DIAGNOSIS — R300 Dysuria: Secondary | ICD-10-CM

## 2015-02-08 LAB — CBC WITH DIFFERENTIAL/PLATELET
BASO%: 0.3 % (ref 0.0–2.0)
Basophils Absolute: 0 10*3/uL (ref 0.0–0.1)
EOS%: 0.7 % (ref 0.0–7.0)
Eosinophils Absolute: 0 10*3/uL (ref 0.0–0.5)
HCT: 40.6 % (ref 34.8–46.6)
HGB: 12.8 g/dL (ref 11.6–15.9)
LYMPH%: 17.4 % (ref 14.0–49.7)
MCH: 31.2 pg (ref 25.1–34.0)
MCHC: 31.5 g/dL (ref 31.5–36.0)
MCV: 99 fL (ref 79.5–101.0)
MONO#: 0.6 10*3/uL (ref 0.1–0.9)
MONO%: 10.6 % (ref 0.0–14.0)
NEUT#: 4.3 10*3/uL (ref 1.5–6.5)
NEUT%: 71 % (ref 38.4–76.8)
Platelets: 289 10*3/uL (ref 145–400)
RBC: 4.1 10*6/uL (ref 3.70–5.45)
RDW: 13 % (ref 11.2–14.5)
WBC: 6 10*3/uL (ref 3.9–10.3)
lymph#: 1.1 10*3/uL (ref 0.9–3.3)

## 2015-02-08 LAB — BASIC METABOLIC PANEL
ANION GAP: 10 meq/L (ref 3–11)
BUN: 17.2 mg/dL (ref 7.0–26.0)
CO2: 32 mEq/L — ABNORMAL HIGH (ref 22–29)
Calcium: 9.9 mg/dL (ref 8.4–10.4)
Chloride: 98 mEq/L (ref 98–109)
Creatinine: 0.7 mg/dL (ref 0.6–1.1)
EGFR: 82 mL/min/{1.73_m2} — ABNORMAL LOW (ref >90)
GLUCOSE: 86 mg/dL (ref 70–140)
Potassium: 4.2 mEq/L (ref 3.5–5.1)
Sodium: 139 mEq/L (ref 136–145)

## 2015-02-08 LAB — URINALYSIS, MICROSCOPIC - CHCC
Bilirubin (Urine): NEGATIVE
GLUCOSE UR CHCC: NEGATIVE mg/dL
Ketones: NEGATIVE mg/dL
NITRITE: NEGATIVE
PH: 6 (ref 4.6–8.0)
Protein: 30 mg/dL
Specific Gravity, Urine: 1.03 (ref 1.003–1.035)
UROBILINOGEN UR: 0.2 mg/dL (ref 0.2–1)

## 2015-02-08 MED ORDER — CIPROFLOXACIN HCL 250 MG PO TABS: 250.0000 mg | ORAL_TABLET | Freq: Two times a day (BID) | ORAL | Status: AC

## 2015-02-08 NOTE — Progress Notes (Signed)
  [place] OFFICE PROGRESS NOTE   Diagnosis: Ovarian cancer  INTERVAL HISTORY:   Ms. Daun now lives in a retirement community. She called on 02/02/2015 with a report of increased pelvic pain. She increased the hydrocodone and tramadol. She reports difficulty with urination for the past week. She states that she passes very little urine and has difficulty initiating the urinary stream. No burning or bleeding. She is having bowel movements. She reports adequate fluid intake. No leg weakness or numbness. Objective:  Vital signs in last 24 hours:  Blood pressure 137/73, pulse 95, temperature 98.8 F (37.1 C), temperature source Oral, resp. rate 17, height 4' 8.25" (1.429 m), weight 77 lb (34.927 kg), SpO2 93 %.   Resp: Distant breath sounds, no respiratory distress Cardio: Regular rate and rhythm GI: No hepatomegaly, no mass, mildly distended in the low abdomen, nontender Vascular: No leg edema Musculoskeletal: No tenderness at the sacrum or iliac, no pain with motion at the left hip      Lab Results:  Lab Results  Component Value Date   WBC 6.0 02/08/2015   HGB 12.8 02/08/2015   HCT 40.6 02/08/2015   MCV 99.0 02/08/2015   PLT 289 02/08/2015   NEUTROABS 4.3 02/08/2015   BUN 17.2, creatinine 0.7, calcium 9.9 Urinalysis: Many bacteria, 11-20 white cells, 3-6 red cells, leukocyte esterase moderate  Medications: I have reviewed the patient's current medications.  Assessment/Plan: 1. Poorly different it adenocarcinoma involving a left adnexal mass with radiographic evidence of carcinomatosis, probable GYN primary  Status post 4 cycles of single agent carboplatin, last given 12/08/2013  CT of the abdomen/pelvis 11/29/2013, after 3 cycles of carboplatin, revealed improvement in the left adnexal mass and carcinomatosis  Therapy switch to tamoxifen on 01/07/2014  CT abdomen/pelvis 03/02/2014-left adnexal lesion and peritoneal lesions are smaller  CA 125  improved 03/02/2014  CA 125 higher 05/03/2014 and 06/28/2014  CT abdomen/pelvis 07/18/2014 revealed enlargement of the left adnexal mass and deep left pelvic implant  CT abdomen/pelvis 11/11/2014 with a stable left adnexal mass, slight enlargement of a deep pelvic implant, increased consolidation in the right lower lung   2. Oxygen dependent COPD  3. History of left lower extremity pain, likely secondary to benign musculoskeletal disease involving the spine versus left pelvic sidewall tumor  4. Osteoporosis/compression fractures  5. History of C. difficile colitis  6.  Urinary hesitancy-likely secondary to urinary tract infection, we prescribe ciprofloxacin today    Disposition:  Ms. Alamin has metastatic adenocarcinoma, likely a GYN primary. She is enrolled in the Hospice program with the plan for comfort care. She will continue hydrocodone and tramadol for pain. The urinary hesitancy is most likely secondary to the urinary tract infection. I have a low clinical suspicion for a neurogenic bladder. A post void residual by bladder scan today measured only 67 mL. She will complete a course of ciprofloxacin and contact us if the symptoms do not improve. It is possible the urinary hesitancy is in part related to using increased amounts of narcotic analgesics.  Ms. Froning will return for an office visit in 2 months.  Betsy Coder, MD  02/08/2015  1:49 PM

## 2015-02-08 NOTE — Progress Notes (Signed)
Bladder scan complete after pt gave urine specimen. 67 mL residual urine. Dr. Benay Spice made aware.

## 2015-02-08 NOTE — Telephone Encounter (Signed)
Gv pt appt for 04/05/15.

## 2015-02-09 LAB — CA 125: CA 125: 3297 U/mL — ABNORMAL HIGH (ref <35)

## 2015-02-09 LAB — URINE CULTURE

## 2015-03-06 ENCOUNTER — Ambulatory Visit: Admitting: Oncology

## 2015-03-23 ENCOUNTER — Telehealth: Payer: Self-pay | Admitting: *Deleted

## 2015-03-23 NOTE — Telephone Encounter (Signed)
"  I'm calling for Bianca [name] who has an appointment with Dr. Benay Spice and lost the paper work."  Informed appointment is April 05, 2015 at 10:30 am.  Bianca [name] will notify patient of date and time of next scheduled F/U.

## 2015-03-28 ENCOUNTER — Other Ambulatory Visit: Payer: Self-pay | Admitting: *Deleted

## 2015-03-28 ENCOUNTER — Telehealth: Payer: Self-pay | Admitting: Oncology

## 2015-03-28 NOTE — Progress Notes (Signed)
Pt.'s daughter Page called this AM asking if Dr. Benay Spice was available to see pt earlier then scheduled appt on 04/05/15.  Appt made for L. Thomas NP to see pt on 03/29/15 at [place].  Phone call placed to Page and she is aware of appt time for tomorrow.  Pt.'s daughter appreciative of call.

## 2015-03-28 NOTE — Telephone Encounter (Signed)
MD visit r/s with NP/LT per 02/21 POF, pt is aware... KJ

## 2015-03-29 ENCOUNTER — Telehealth: Payer: Self-pay | Admitting: Nurse Practitioner

## 2015-03-29 ENCOUNTER — Ambulatory Visit (HOSPITAL_BASED_OUTPATIENT_CLINIC_OR_DEPARTMENT_OTHER): Admitting: Nurse Practitioner

## 2015-03-29 VITALS — BP 145/77 | HR 90 | Temp 98.3°F | Resp 18 | Ht <= 58 in | Wt 75.6 lb

## 2015-03-29 DIAGNOSIS — C562 Malignant neoplasm of left ovary: Secondary | ICD-10-CM

## 2015-03-29 DIAGNOSIS — C801 Malignant (primary) neoplasm, unspecified: Secondary | ICD-10-CM | POA: Diagnosis not present

## 2015-03-29 MED ORDER — OXYCODONE-ACETAMINOPHEN 5-325 MG PO TABS: 0.5000 | ORAL_TABLET | Freq: Four times a day (QID) | ORAL | Status: DC | PRN

## 2015-03-29 MED ORDER — MORPHINE SULFATE ER 15 MG PO TBCR: 15.0000 mg | EXTENDED_RELEASE_TABLET | Freq: Every day | ORAL | Status: DC

## 2015-03-29 NOTE — Progress Notes (Addendum)
[place] OFFICE PROGRESS NOTE   Diagnosis:  Ovarian cancer  INTERVAL HISTORY:   Bianca [name] returns prior to scheduled follow-up for evaluation of increased pain. She reports increased pain at the right lower abdomen/pelvis for the past 2 months. The pain occurs intermittently multiple times a day. The pain can last up to 1-1/2 hours. She is taking 4-5 pain pills a day, either tramadol or hydrocodone. She notes that the tramadol is more effective. Bowels are moving. No nausea or vomiting. She has noted some vaginal bleeding.  Objective:  Vital signs in last 24 hours:  Blood pressure 145/77, pulse 90, temperature 98.3 F (36.8 C), temperature source Oral, resp. rate 18, height 4' 8.25" (1.429 m), weight 75 lb 9.6 oz (34.292 kg), SpO2 100 %.    HEENT: No thrush or ulcers. Resp: Distant breath sounds. Cardio: Regular rate and rhythm. GI: Abdomen is soft. No hepatomegaly. Tenderness with associated fullness right lower abdomen. No discrete mass. Vascular: No leg edema. Musculoskeletal: Nontender over the pubic bone. No pain with internal/external rotation of the left hip.  Lab Results:  Lab Results  Component Value Date   WBC 6.0 02/08/2015   HGB 12.8 02/08/2015   HCT 40.6 02/08/2015   MCV 99.0 02/08/2015   PLT 289 02/08/2015   NEUTROABS 4.3 02/08/2015    Imaging:  No results found.  Medications: I have reviewed the patient's current medications.  Assessment/Plan: 1. Poorly different it adenocarcinoma involving a left adnexal mass with radiographic evidence of carcinomatosis, probable GYN primary  Status post 4 cycles of single agent carboplatin, last given 12/08/2013  CT of the abdomen/pelvis 11/29/2013, after 3 cycles of carboplatin, revealed improvement in the left adnexal mass and carcinomatosis  Therapy switch to tamoxifen on 01/07/2014  CT abdomen/pelvis 03/02/2014-left adnexal lesion and peritoneal lesions are smaller  CA 125 improved  03/02/2014  CA 125 higher 05/03/2014 and 06/28/2014  CT abdomen/pelvis 07/18/2014 revealed enlargement of the left adnexal mass and deep left pelvic implant  CT abdomen/pelvis 11/11/2014 with a stable left adnexal mass, slight enlargement of a deep pelvic implant, increased consolidation in the right lower lung   2. Oxygen dependent COPD  3. History of left lower extremity pain, likely secondary to benign musculoskeletal disease involving the spine versus left pelvic sidewall tumor  4. Osteoporosis/compression fractures  5. History of C. difficile colitis  6. 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 She is enrolled in the hospice program. She presents today with increased pain at the right low abdomen/pelvis. She declines radiographic evaluation. She is most comfortable with an adjustment to her pain medication regimen. She will begin MS Contin 15 mg daily. For breakthrough pain she will try Percocet one half to one tablet every 6 hours as needed. We will adjust the pain medications accordingly pending her tolerance of the new medications as well as the effectiveness.  She will return for a follow-up visit in one month.  Patient seen with Dr. Benay Spice. 25 minutes were spent face-to-face at today's visit with the majority of that time involved in counseling/coordination of care.    Ned Card ANP/GNP-BC   03/29/2015  9:17 AM This was a shared visit with Ned Card. Bianca [name] was interviewed and examined. She now has pelvic pain, potentially related to progressive ovarian cancer. She does not wish to undergo further evaluation of the pain. She will begin a trial of MS Contin  and Percocet.  She continues follow-up with the [facility] program. Bianca [name] will return for an office visit in one month. We will see her sooner as  needed.  Julieanne Manson, M.D.

## 2015-03-29 NOTE — Telephone Encounter (Signed)
As per Owens Shark NP call placed to Hospice Nurse [place] RN to inform her that pt. Is requesting a portable oxygen tank. Inquired if that was done by her or [facility], She stated she would take care of the ordering the portable oxygen tank for the Pt.

## 2015-03-29 NOTE — Telephone Encounter (Signed)
per pof to sh pt appt-gave pt copy of avs °

## 2015-04-05 ENCOUNTER — Ambulatory Visit: Admitting: Oncology

## 2015-04-11 ENCOUNTER — Telehealth: Payer: Self-pay | Admitting: *Deleted

## 2015-04-11 DIAGNOSIS — C562 Malignant neoplasm of left ovary: Secondary | ICD-10-CM

## 2015-04-11 MED ORDER — OXYCODONE-ACETAMINOPHEN 5-325 MG PO TABS: 0.5000 | ORAL_TABLET | Freq: Four times a day (QID) | ORAL | Status: DC | PRN

## 2015-04-11 NOTE — Telephone Encounter (Signed)
"  Hospice patient is out of (percocet) oxycodone-APAP 5-325 mg tablets.  Uses [facility] on [address]."  Message left for collaborative with this request.

## 2015-04-11 NOTE — Telephone Encounter (Signed)
Prescription faxed to [facility] as requested.

## 2015-04-12 ENCOUNTER — Telehealth: Payer: Self-pay | Admitting: *Deleted

## 2015-04-12 NOTE — Telephone Encounter (Signed)
Onyeje, Hospice RN returned call. Pt had sufficient amount of MS Contin when she last visited, hospice has not adjusted dose. She will see pt 04/13/15 to review medications and will call with an update. Informed hospice RN that daughter reports she has #3 tablets left.

## 2015-04-12 NOTE — Telephone Encounter (Addendum)
Call from pt's daughter requesting a refill on MS Contin. Pt has 3 tablets left. MS Contin is listed as once daily on our med list, Rx written 2/22. Daughter stated she has "no idea" what happened to the meds. "She's hospice, does that help?". Informed her I will have to review with MD for refill. We need to be able to account for narcotics. Left message for Onyeje, hospice RN to clarify dosing instructions. Has the hospice MD changed dose?

## 2015-04-13 ENCOUNTER — Telehealth: Payer: Self-pay | Admitting: *Deleted

## 2015-04-13 DIAGNOSIS — C562 Malignant neoplasm of left ovary: Secondary | ICD-10-CM

## 2015-04-13 NOTE — Telephone Encounter (Signed)
Call from [place], [place] with Hospice. Patient is out of MS Contin. Patient reports that she only takes once a day. [place] educated daughter and son-in-law on correct administration of medication. Onyeje's contact number [phone].

## 2015-04-14 MED ORDER — MORPHINE SULFATE ER 15 MG PO TBCR: 15.0000 mg | EXTENDED_RELEASE_TABLET | Freq: Every day | ORAL | Status: DC

## 2015-04-14 NOTE — Telephone Encounter (Addendum)
Reviewed with Ned Card, NP. We will write for one early refill. Pt's family will need to ensure she is taking one tablet daily and keep meds in a safe place so they are not able to be tampered with, lost or taken inconsistent with prescribed instructions. Rx will be faxed to pharmacy. Attempted to call pt, no answer at home.  Reviewed with Onyeje, Hospice RN: She will reinforce with pt. She reports pt was adamant during her visit that she has not taken more than one tablet per day. Hospice RN asks that I alert family to pick up Rx.

## 2015-04-14 NOTE — Addendum Note (Signed)
Addended by: Brien Few on: 04/14/2015 04:20 PM   Modules accepted: Orders

## 2015-04-14 NOTE — Telephone Encounter (Signed)
Left message on voicemail for daughter, Page informing her Rx has been faxed to pharmacy. We will not be able to fill any early prescriptions going forward.

## 2015-04-17 ENCOUNTER — Encounter: Payer: Self-pay | Admitting: *Deleted

## 2015-04-17 NOTE — Progress Notes (Signed)
Pt.'s Daughter called on call service on Saturday morning "concerned about possible overdosing" and stated that "her mother is going through her medicine way too fast" and spoke with on call MD.  Call placed this morning by this RN to Page, pt.'s daughter to check on pt.'s status at this time.  Pt.'s daughter states that pt "is fine now and that we have her medicines straightened out for her."  Encouraged pt.'s daughter to call us with any other concerns or questions.  She has none at this time and appreciated phone call.

## 2015-04-24 ENCOUNTER — Telehealth: Payer: Self-pay | Admitting: Oncology

## 2015-04-24 NOTE — Telephone Encounter (Signed)
pt called to cx appt ems pick her up

## 2015-04-26 ENCOUNTER — Ambulatory Visit: Admitting: Oncology

## 2015-05-01 ENCOUNTER — Telehealth: Payer: Self-pay | Admitting: Nurse Practitioner

## 2015-05-01 ENCOUNTER — Telehealth: Payer: Self-pay | Admitting: *Deleted

## 2015-05-01 ENCOUNTER — Other Ambulatory Visit: Payer: Self-pay | Admitting: Nurse Practitioner

## 2015-05-01 DIAGNOSIS — C562 Malignant neoplasm of left ovary: Secondary | ICD-10-CM

## 2015-05-01 MED ORDER — OXYCODONE-ACETAMINOPHEN 5-325 MG PO TABS: 0.5000 | ORAL_TABLET | Freq: Four times a day (QID) | ORAL | Status: DC | PRN

## 2015-05-01 MED ORDER — MORPHINE SULFATE ER 15 MG PO TBCR: 15.0000 mg | EXTENDED_RELEASE_TABLET | Freq: Every day | ORAL | Status: DC

## 2015-05-01 NOTE — Telephone Encounter (Signed)
"  This is Bianca [name] Bianca [name] daughter of Bianca [name].  She is in pain and needs refills with perhaps a dose change so she doesn't need a refill as often.  I know she is early.  She controls her medicine.  I told the doctor and the Hospice doctors but was told she has cancer is why she takes so much.  She only has two morphine pills and two oxycodone pills left.  [facility]ses [facility] on [address]."   Will notify Dr. Benay Spice.  Last MS Contin 15 mg number 30 ordered 04-14-2015.  Oxycodone 5-325 mg take 0.5 to 1 pill every 6 hrs, qty 30 last ordered 04-11-2015.  In range for refills.  Bianca [name] can be reached via mobile [phone].

## 2015-05-01 NOTE — Telephone Encounter (Signed)
per pof to sch pt appt-cld & spoke to pt and gave tp time & date of appt for 4/5 @ 11:45

## 2015-05-01 NOTE — Addendum Note (Signed)
Addended by: Brien Few on: 05/01/2015 05:23 PM   Modules accepted: Orders

## 2015-05-01 NOTE — Telephone Encounter (Addendum)
Called pt, she reports she has #2 MS Contin left though she states she is taking one per day. While reviewing meds over the phone, pt reports someone has written on her MS Contin bottle "2/day." She doesn't think she has been taking 2/ day. Instructed her to continue one daily. Take Oxycodone PRN for breakthrough pain. She voiced understanding, wrote instructions down. Rx will be faxed to pharmacy.

## 2015-05-02 MED ORDER — MORPHINE SULFATE ER 15 MG PO TBCR: 15.0000 mg | EXTENDED_RELEASE_TABLET | Freq: Two times a day (BID) | ORAL | Status: DC

## 2015-05-02 NOTE — Addendum Note (Signed)
Addended by: Brien Few on: 05/02/2015 11:24 AM   Modules accepted: Orders

## 2015-05-02 NOTE — Telephone Encounter (Signed)
Noted call from [place], Hospice RN. Pt's MS Contin dose was increased by Dr. Lyman Speller, Hospice MD. [facility] pharmacy. They did not fill MS Contin Rx written 3/27 - too early to fill. Will fax new Rx with updated instructions.

## 2015-05-02 NOTE — Addendum Note (Signed)
Addended by: Brien Few on: 05/02/2015 02:32 PM   Modules accepted: Orders

## 2015-05-02 NOTE — Telephone Encounter (Signed)
"  Onyeji RN with Hospice calling in reference to refills for morphine and oxycodone.  Ms. Mamon needs these refilled and I assured them I will call this morning.  Dr. Lyman Speller went to the home instructed her to take two morphine per day.  This is why she ran out early.  If Dr. Benay Spice has any questions he can call Dr. Lyman Speller."

## 2015-05-10 ENCOUNTER — Ambulatory Visit: Admitting: Nurse Practitioner

## 2015-05-10 ENCOUNTER — Telehealth: Payer: Self-pay | Admitting: *Deleted

## 2015-05-10 NOTE — Telephone Encounter (Signed)
Patient's daughter called asking for a "phone conference visit because patient is in pain, can't get out of bed, weak with her speech due to s.o.b and yesterday was confused to the date and day of week.  Can't come in today for appointment.  I think she needs to be admitted for pain management."    advised she call Hospice for evaluation in the home.  A.P.P.notified and will cancel today's appointment.

## 2015-05-15 ENCOUNTER — Other Ambulatory Visit: Payer: Self-pay | Admitting: *Deleted

## 2015-05-16 ENCOUNTER — Other Ambulatory Visit: Payer: Self-pay | Admitting: *Deleted

## 2015-05-16 DIAGNOSIS — C562 Malignant neoplasm of left ovary: Secondary | ICD-10-CM

## 2015-05-16 MED ORDER — OXYCODONE-ACETAMINOPHEN 5-325 MG PO TABS: 0.5000 | ORAL_TABLET | Freq: Four times a day (QID) | ORAL | Status: DC | PRN

## 2015-05-16 MED ORDER — MORPHINE SULFATE ER 15 MG PO TBCR: 15.0000 mg | EXTENDED_RELEASE_TABLET | Freq: Two times a day (BID) | ORAL | Status: DC

## 2015-05-16 NOTE — Telephone Encounter (Signed)
Refilled patient perscription (Oxycodone/Morphine)

## 2015-05-25 ENCOUNTER — Telehealth: Payer: Self-pay | Admitting: *Deleted

## 2015-05-25 NOTE — Telephone Encounter (Signed)
Onyeje from hospice called to clarify: the UA was collected d/t burning with urination. She wanted Dr Benay Spice to know.

## 2015-05-25 NOTE — Telephone Encounter (Signed)
Urinalysis reviewed by Dr. Benay Spice: Will await culture. Returned call to [facility], she reports pt is reporting pelvic pain similar to her usual pain. She is not having urinary symptoms at this time. She will call office if symptoms worsen before culture result is available.

## 2015-05-25 NOTE — Telephone Encounter (Signed)
Call from [place], [place] with hospice reporting pt had urinary symptoms. UA and culture was ordered by hospice MD on 4/18. Asking if Dr. Benay Spice has seen result. Informed her results have not yet been received. Returned call to [place], received fax today. Will review with MD and call back.

## 2015-05-29 ENCOUNTER — Encounter: Payer: Self-pay | Admitting: *Deleted

## 2015-05-29 NOTE — Progress Notes (Signed)
Received fax from [facility] regarding positive urine culture for pt.  Call placed to [facility] and they have placed pt on antibiotic at this time.

## 2015-06-01 ENCOUNTER — Emergency Department (HOSPITAL_COMMUNITY)
Admission: EM | Admit: 2015-06-01 | Discharge: 2015-06-01 | Disposition: A | Discharge status: Home / Self Care | Payer: Medicare Other | Attending: Emergency Medicine | Admitting: Emergency Medicine

## 2015-06-01 ENCOUNTER — Encounter (HOSPITAL_COMMUNITY): Payer: Self-pay | Admitting: Emergency Medicine

## 2015-06-01 DIAGNOSIS — J449 Chronic obstructive pulmonary disease, unspecified: Secondary | ICD-10-CM | POA: Diagnosis not present

## 2015-06-01 DIAGNOSIS — R52 Pain, unspecified: Secondary | ICD-10-CM

## 2015-06-01 DIAGNOSIS — Z87891 Personal history of nicotine dependence: Secondary | ICD-10-CM | POA: Diagnosis not present

## 2015-06-01 DIAGNOSIS — C562 Malignant neoplasm of left ovary: Secondary | ICD-10-CM

## 2015-06-01 DIAGNOSIS — G893 Neoplasm related pain (acute) (chronic): Secondary | ICD-10-CM | POA: Insufficient documentation

## 2015-06-01 DIAGNOSIS — Z8719 Personal history of other diseases of the digestive system: Secondary | ICD-10-CM | POA: Insufficient documentation

## 2015-06-01 DIAGNOSIS — Z79899 Other long term (current) drug therapy: Secondary | ICD-10-CM | POA: Insufficient documentation

## 2015-06-01 DIAGNOSIS — Z8739 Personal history of other diseases of the musculoskeletal system and connective tissue: Secondary | ICD-10-CM | POA: Diagnosis not present

## 2015-06-01 DIAGNOSIS — Z8639 Personal history of other endocrine, nutritional and metabolic disease: Secondary | ICD-10-CM | POA: Insufficient documentation

## 2015-06-01 DIAGNOSIS — Z880 Allergy status to penicillin: Secondary | ICD-10-CM | POA: Diagnosis not present

## 2015-06-01 DIAGNOSIS — I10 Essential (primary) hypertension: Secondary | ICD-10-CM | POA: Insufficient documentation

## 2015-06-01 MED ORDER — FENTANYL 50 MCG/HR TD PT72: 50.0000 ug | MEDICATED_PATCH | TRANSDERMAL | Status: DC

## 2015-06-01 MED ORDER — FENTANYL 25 MCG/HR TD PT72
50.0000 ug | MEDICATED_PATCH | TRANSDERMAL | Status: DC
  Administered 2015-06-01: 50 ug via TRANSDERMAL
  Filled 2015-06-01: qty 2

## 2015-06-01 NOTE — ED Notes (Signed)
Pt with family member at bedside seeking placement as prior triage note states. Pt needs pain control for existing diagnosis. Pt alert and oriented. Pain 4 post med admin.

## 2015-06-01 NOTE — ED Notes (Signed)
Pt with [facility] from home with metastatic cancer. Pt and family seeking pain management help, currently with hospice. Meds po morphine and norco 5-325 but is not having relief.  Per [facility] 150 mcg Fentanyl IV push and 481ml Bolus in route. Wears home oxygen. Pt calm and oriented.

## 2015-06-01 NOTE — ED Provider Notes (Signed)
CSN: [id number]     Arrival date & time 06/01/15  1418 History   First MD Initiated Contact with Patient 06/01/15 1514     Chief Complaint  Patient presents with  . Pain Management   . Seeking Placement  PT IS HERE WITH PAIN FROM METASTATIC OVARIAN CANCER.  SHE IS CURRENTLY WITH [facility] AND TAKES MS CONTIN, PERCOCET AND TRAMADOL FOR PAIN.  PT RECEIVED FENTANYL 150 MCG IV BY EMS AND FEELS MUCH BETTER.  PAIN IS GONE.  THE PT JUST WANTS DIFFERENT PAIN MEDS.  THE PT DOES NOT WANT ANY OTHER EVAL FOR PAIN AS IT IS KNOWN CARCINOMATOSIS.   (Consider location/radiation/quality/duration/timing/severity/associated sxs/prior Treatment) The history is provided by the patient.    Past Medical History  Diagnosis Date  . DIVERTICULOSIS, COLON   . VITAMIN D DEFICIENCY   . HYPERLIPIDEMIA   . HYPERTENSION   . OSTEOPOROSIS   . COPD (chronic obstructive pulmonary disease) ([place])     PFTs 02/10/12: mild-mod  . Cancer [facility])     ovarian ca   Past Surgical History  Procedure Laterality Date  . Cystoscopy  07/2005  . Vertebroplasty  03/2005    Dr. Vernard Gambles   Family History  Problem Relation Age of Onset  . Heart disease Mother   . Hypertension Mother   . Heart disease Father   . Hypertension Father    Social History  Substance Use Topics  . Smoking status: Former Smoker -- 0.30 packs/day for 31 years    Types: Cigarettes    Start date: 02/04/1950    Quit date: 02/04/1981  . Smokeless tobacco: Never Used     Comment: Married, lives with spouse. retired  . Alcohol Use: 1.2 oz/week    2 Glasses of wine per week   OB History    No data available     Review of Systems  Gastrointestinal: Positive for abdominal pain.  Musculoskeletal: Positive for back pain.  All other systems reviewed and are negative.     Allergies  Aspirin; Codeine; Penicillins; Shrimp; and Tomato  Home Medications   Prior to Admission medications   Medication Sig Start Date End Date Taking? Authorizing  Provider  morphine (MS CONTIN) 15 MG 12 hr tablet Take 1 tablet (15 mg total) by mouth every 12 (twelve) hours. 05/16/15  Yes Ladell Pier, MD  oxyCODONE-acetaminophen (PERCOCET/ROXICET) 5-325 MG tablet Take 0.5-1 tablets by mouth every 6 (six) hours as needed for severe pain. 05/16/15  Yes Ladell Pier, MD  senna-docusate (SENOKOT-S) 8.6-50 MG per tablet Take 1 tablet by mouth 2 (two) times daily as needed. Patient taking differently: Take 1 tablet by mouth 2 (two) times daily as needed.  10/04/14  Yes Ladell Pier, MD  SPIRIVA HANDIHALER 18 MCG inhalation capsule INHALE CONTENTS OF 1 CAPSULE ONCE DAILY USING HANDIHALER 02/08/15  Yes Ladell Pier, MD  traMADol (ULTRAM) 50 MG tablet TAKE 1-2 TABLETS BY MOUTH EVERY 8 HOURS AS NEEDED FOR MODERATE PAIN 01/13/15  Yes Ladell Pier, MD  albuterol (PROVENTIL HFA;VENTOLIN HFA) 108 (90 BASE) MCG/ACT inhaler Inhale 2 puffs into the lungs every 6 (six) hours as needed for wheezing or shortness of breath. Patient not taking: Reported on 06/01/2015 09/14/14   Ladell Pier, MD  albuterol (PROVENTIL) (2.5 MG/3ML) 0.083% nebulizer solution Take 3 mLs (2.5 mg total) by nebulization every 6 (six) hours as needed for wheezing or shortness of breath. Patient not taking: Reported on 11/15/2014 09/14/14   Ladell Pier, MD  ciprofloxacin (CIPRO) 250 MG tablet Take 1 tablet (250 mg total) by mouth 2 (two) times daily. Patient not taking: Reported on 06/01/2015 02/08/15   Ladell Pier, MD  fentaNYL (DURAGESIC) 50 MCG/HR Place 1 patch (50 mcg total) onto the skin every 3 (three) days. 06/01/15   Isla Pence, MD  HYDROcodone-acetaminophen (NORCO/VICODIN) 5-325 MG tablet Take 1 tablet by mouth every 6 (six) hours as needed for moderate pain. Patient not taking: Reported on 06/01/2015 12/27/14   Ladell Pier, MD  LORazepam (ATIVAN) 0.5 MG tablet Take one half tablet (0.25 mg) by mouth or under the tongue every six hours as needed for nausea. Patient not taking:  Reported on 06/01/2015 01/13/15   Ladell Pier, MD  polyethylene glycol [facility]) packet Take 17 g by mouth daily. Patient not taking: Reported on 06/01/2015 11/11/14   Tatyana Kirichenko, PA-C   BP 124/62 mmHg  Pulse 84  Temp(Src) 98.3 F (36.8 C) (Oral)  Resp 23  SpO2 100% Physical Exam  Constitutional: She appears cachectic. She is easily aroused.  HENT:  Head: Normocephalic and atraumatic.  Eyes: Conjunctivae and EOM are normal. Pupils are equal, round, and reactive to light.  Neck: Normal range of motion. Neck supple.  Cardiovascular: Normal rate, regular rhythm and normal heart sounds.   Pulmonary/Chest: Effort normal and breath sounds normal.  Abdominal: There is tenderness.  Musculoskeletal: Normal range of motion.  Neurological: She is alert and easily aroused.  Skin: Skin is warm and dry.  Psychiatric: She has a normal mood and affect.  Nursing note and vitals reviewed.   ED Course  Procedures (including critical care time) Labs Review Labs Reviewed - No data to display  Imaging Review No results found. I have personally reviewed and evaluated these images and lab results as part of my medical decision-making.   EKG Interpretation None      MDM  I LEFT A PHONE MESSAGE WITH HOSPICE REGARDING REQUEST FOR DIFFERENT PAIN MEDS. Final diagnoses:  Ovarian cancer on left [facility])  Inadequate pain control      Isla Pence, MD 06/01/15 2329

## 2015-06-01 NOTE — ED Notes (Signed)
Bed: [id number] Expected date:  Expected time:  Means of arrival:  Comments: EMS-pain control-cancer patient

## 2015-06-02 ENCOUNTER — Encounter: Payer: Self-pay | Admitting: *Deleted

## 2015-06-02 NOTE — Progress Notes (Signed)
Call received from [place] with [facility] to confirm pt.'s pain medications.  Pt started on Fentanyl patch in the ED on 06/01/14.  Per L. Thomas NP, pt to continue Oxycodone and Tramadol, but not MS Contin with Fentanyl patch.  Onije verbalizes an understanding of orders and has no further questions at this time.

## 2015-06-05 ENCOUNTER — Telehealth: Payer: Self-pay

## 2015-06-05 ENCOUNTER — Telehealth: Payer: Self-pay | Admitting: *Deleted

## 2015-06-05 DIAGNOSIS — C562 Malignant neoplasm of left ovary: Secondary | ICD-10-CM

## 2015-06-05 MED ORDER — OXYCODONE-ACETAMINOPHEN 5-325 MG PO TABS: 0.5000 | ORAL_TABLET | Freq: Four times a day (QID) | ORAL | Status: AC | PRN

## 2015-06-05 NOTE — Telephone Encounter (Signed)
Maura from [place] called regarding patient's current pain medication regimen.  Requesting to speak to MD's nurse.  Call transferred to [place], [place]

## 2015-06-05 NOTE — Telephone Encounter (Signed)
Call from [place], [facility] RN requesting clarification of pt's narcotic regimen. Pt has 43mcg Fentanyl patch on along with 50 mcg patch. Per hospice notes and ED documentation, pt is to be on 50 mcg patch. Pt will continue on 50 mcg patch. Pt is requesting refill on Oxycodone.  Reviewed with Dr. Benay Spice: Oxycodone refill will be faxed to pharmacy.

## 2015-06-12 ENCOUNTER — Other Ambulatory Visit: Payer: Self-pay | Admitting: *Deleted

## 2015-06-16 ENCOUNTER — Encounter: Payer: Self-pay | Admitting: *Deleted

## 2015-06-16 NOTE — Progress Notes (Signed)
Call received from [place] stating that per pt.'s daughter, pt is taking two Percocet tablets every 6 hours for pain.  When patient asked by the Hospice nurse, pt states that she is not taking Percocet as told by her daughter, but is taking it as needed.  Will inform Dr. Benay Spice.

## 2015-06-21 ENCOUNTER — Emergency Department (HOSPITAL_COMMUNITY)
Admission: EM | Admit: 2015-06-21 | Discharge: 2015-06-21 | Disposition: A | Discharge status: Home / Self Care | Attending: Emergency Medicine | Admitting: Emergency Medicine

## 2015-06-21 ENCOUNTER — Telehealth: Payer: Self-pay | Admitting: *Deleted

## 2015-06-21 ENCOUNTER — Encounter (HOSPITAL_COMMUNITY): Payer: Self-pay

## 2015-06-21 DIAGNOSIS — Z79891 Long term (current) use of opiate analgesic: Secondary | ICD-10-CM | POA: Insufficient documentation

## 2015-06-21 DIAGNOSIS — K5730 Diverticulosis of large intestine without perforation or abscess without bleeding: Secondary | ICD-10-CM | POA: Insufficient documentation

## 2015-06-21 DIAGNOSIS — I10 Essential (primary) hypertension: Secondary | ICD-10-CM | POA: Diagnosis not present

## 2015-06-21 DIAGNOSIS — E785 Hyperlipidemia, unspecified: Secondary | ICD-10-CM | POA: Diagnosis not present

## 2015-06-21 DIAGNOSIS — Z792 Long term (current) use of antibiotics: Secondary | ICD-10-CM | POA: Insufficient documentation

## 2015-06-21 DIAGNOSIS — M810 Age-related osteoporosis without current pathological fracture: Secondary | ICD-10-CM | POA: Diagnosis not present

## 2015-06-21 DIAGNOSIS — J449 Chronic obstructive pulmonary disease, unspecified: Secondary | ICD-10-CM | POA: Insufficient documentation

## 2015-06-21 DIAGNOSIS — Z7951 Long term (current) use of inhaled steroids: Secondary | ICD-10-CM | POA: Diagnosis not present

## 2015-06-21 DIAGNOSIS — Z87891 Personal history of nicotine dependence: Secondary | ICD-10-CM | POA: Diagnosis not present

## 2015-06-21 DIAGNOSIS — Z79899 Other long term (current) drug therapy: Secondary | ICD-10-CM | POA: Insufficient documentation

## 2015-06-21 DIAGNOSIS — C569 Malignant neoplasm of unspecified ovary: Secondary | ICD-10-CM | POA: Diagnosis not present

## 2015-06-21 DIAGNOSIS — C799 Secondary malignant neoplasm of unspecified site: Secondary | ICD-10-CM

## 2015-06-21 DIAGNOSIS — G893 Neoplasm related pain (acute) (chronic): Secondary | ICD-10-CM | POA: Diagnosis present

## 2015-06-21 MED ORDER — FENTANYL 25 MCG/HR TD PT72
75.0000 ug | MEDICATED_PATCH | TRANSDERMAL | Status: DC
  Administered 2015-06-21: 75 ug via TRANSDERMAL
  Filled 2015-06-21: qty 3

## 2015-06-21 MED ORDER — FENTANYL 75 MCG/HR TD PT72: 75.0000 ug | MEDICATED_PATCH | TRANSDERMAL | Status: AC

## 2015-06-21 NOTE — Telephone Encounter (Signed)
Received walk-in form from lobby: Pt is being discharged from ED with 75 mcg Fentanyl patch. Daughter wants to be sure pt does not run out of patches in the future. Per note, pt had been telling hospice RN "that patch doesn't work." Daughter thinks pt is confused. Left message for hospice RN, Onyeje to call office to discuss pt. Returned call to [place]: She reports they have a prescription for #6 patches.  Instructed her to call office for refill when pt # 5 is applied. Paige voiced understanding. Encouraged her to call us or hospice for symptom management before going to ED. The benefit of hospice is to prevent frequent trips to ED if possible.  Onyeje returned call, informed her of above. She is scheduled to see pt again and will reinforce teaching re: Fentanyl patches and breakthrough pain medication.

## 2015-06-21 NOTE — ED Notes (Signed)
MD at bedside. [place]

## 2015-06-21 NOTE — Discharge Instructions (Signed)
Metastatic Cancer Metastatic cancer is cancer that has spread from the place where it started (primary site) to another part of the body. The process of cancer spreading from the primary site is called metastasis. When cancer cells metastasize, they do not change the way they look or the way they affect the body. Primary lung cancer that spreads to the brain is metastatic lung cancer, not brain cancer. Cancer cells can spread:  Directly from one part of the body to a nearby area (local invasion).  Into a lymph vessel and be carried through the lymph system to lymph nodes and other parts of the body. The lymph system is a network of vessels and nodes that carry fluid throughout the body and help to protect against infections.  Into the blood vessels and be carried to other parts of the body through the bloodstream. WHAT TYPES OF CANCER CAN SPREAD? All types of cancer can spread. Some cancers are more likely to metastasize than others. The most common places that cancers metastasize to are:  Bones.  Liver.  Lungs. Cancers that are more advanced when they are diagnosed and treated are more likely to metastasize. Some primary cancers are more likely to metastasize to a specific part of the body. For example:  Breast cancer may spread to:  Bones.  Brain.  Liver.  Lungs.  Lung cancer may spread to:  Bones.  Brain.  Liver.  Adrenal gland.  Other lung.  Melanoma skin cancer may spread to:  Bones.  Brain.  Liver.  Lungs.  Muscles.  Prostate cancer may spread to:  Bones.  Liver.  Lungs.  Adrenal gland.  Colon cancer may spread to:  Tissue that lines the abdominal wall and covers most of the abdominal organs.  Liver.  Lungs. WHAT ARE THE RISKS FOR METASTATIC CANCER? Your risk for metastatic cancer depends on:  The type of cancer that you have.  The stage and grade of your primary cancer at the time of diagnosis. Tumors are graded by looking at tumor  cells under a microscope. Grading predicts how quickly the tumor cells will grow. Your health care provider will use the stage and the grade of your primary cancer to determine the chances of metastasis. This helps your health care provider to find the best treatment for you. Risk for metastasis may go up with:  A larger primary tumor.  A higher grade of tumor.  Deeper growth of tumor.  Lymph node involvement. HOW IS METASTATIC CANCER DIAGNOSED? Your health care provider may suspect metastatic cancer from your signs and symptoms. Some people do not have any symptoms. Their cancer is found through imaging or other tests.  Symptoms may include:  Weakness.  Lack of energy.  Pain.  Weight loss.  Trouble breathing.  Signs may include:  Fluid buildup in your lungs or belly.  Tumor growths that can be felt or seen.  An enlarged liver. Your health care provider will also do a physical exam. This may include:  Blood tests to check for certain substances that are secreted by tumors (tumor markers).  Tumor markers that increase after treatment can indicate metastasis.  Tumor markers may be used to help diagnose metastasis in colon and prostate cancer.  Not all cancers have tumor markers.  Imaging studies, such as:  X-rays.  Ultrasound.  MRI.  Other imaging tests, such as CT scans, bone scans, and PET scans.  Biopsy.  This involves checking a small piece of tissue from a new cancer site to  see if the cells are similar to cancer cells from the primary site. This can confirm metastatic cancer.  Biopsies may be done by surgically removing a piece of tissue or using a needle to get a tissue sample.  Testing fluid samples from the lungs, spine, or belly for metastatic cancer cells. WHAT ARE THE TREATMENT OPTIONS FOR METASTATIC CANCER? There are many options for treating metastatic cancer. Your treatment will depend on:  The type of cancer that you have.  How far your  cancer has advanced.  Your general health. Treatment may not be able to cure metastatic cancer, but it can often relieve the symptoms. In many cases, you may have a combination of treatments. Options may include:  Surgery.  Cancer-killing drugs (chemotherapy).  X-ray treatment (radiation therapy).  Hormone therapy.  Treatments that help your body to fight cancer (biologic therapy). CAN METASTATIC CANCER BE PREVENTED? The only way to prevent metastatic cancer is to find your primary cancer early and treat it successfully. Talk with your health care provider about cancer screening. Screening exams for early detection are available for some types of cancer, including:  Breast.  Colon.  Prostate.  Lung.  Cervical. HOW CAN I LEARN MORE? The following websites provide more information.  [place] ([place]) http://www.hicks.com/  [place] (ACS) http://www.cancer.org/treatment/understandingyourdiagnosis/advancedcancer/advanced-cancer-what-is-metastatic   This information is not intended to replace advice given to you by your health care provider. Make sure you discuss any questions you have with your health care provider.   Document Released: 05/28/2004 Document Revised: 02/11/2014 Document Reviewed: 05/06/2013 Elsevier Interactive Patient Education [employer].

## 2015-06-21 NOTE — ED Provider Notes (Signed)
CSN: [id number]     Arrival date & time 06/21/15  45 History   First MD Initiated Contact with Patient 06/21/15 1341     Chief Complaint  Patient presents with  . Pain  . Ovarian Cancer  . HOSPICE     HPI Pt has history of ovarian cancer.  Dr Ammie Dalton has been seeing her but she is not getting any active cancer treatments now.  Pt came into the ED today because she started having pain againThat she associates with her cancer. The pain is all over..  Pt does not want any treatments but wants better pain control.  She has been taking oxycodone but it does not help.  She called ems and was given fentanyl and her pain is much better now.  She had been on fentany patches after a previous ed visit and it helped a lot but she ran out.  She tried calling hospice but they have not increased her medications.  Past Medical History  Diagnosis Date  . DIVERTICULOSIS, COLON   . VITAMIN D DEFICIENCY   . HYPERLIPIDEMIA   . HYPERTENSION   . OSTEOPOROSIS   . COPD (chronic obstructive pulmonary disease) ([place])     PFTs 02/10/12: mild-mod  . Cancer [facility])     ovarian ca   Past Surgical History  Procedure Laterality Date  . Cystoscopy  07/2005  . Vertebroplasty  03/2005    Dr. Vernard Gambles   Family History  Problem Relation Age of Onset  . Heart disease Mother   . Hypertension Mother   . Heart disease Father   . Hypertension Father    Social History  Substance Use Topics  . Smoking status: Former Smoker -- 0.30 packs/day for 31 years    Types: Cigarettes    Start date: 02/04/1950    Quit date: 02/04/1981  . Smokeless tobacco: Never Used     Comment: Married, lives with spouse. retired  . Alcohol Use: 1.2 oz/week    2 Glasses of wine per week   OB History    No data available     Review of Systems  Constitutional: Negative for fever.  Gastrointestinal: Negative for vomiting and diarrhea.  Genitourinary: Negative for dysuria.  All other systems reviewed and are negative.     Allergies   Aspirin; Codeine; Penicillins; Shrimp; and Tomato  Home Medications   Prior to Admission medications   Medication Sig Start Date End Date Taking? Authorizing Provider  albuterol (PROVENTIL HFA;VENTOLIN HFA) 108 (90 BASE) MCG/ACT inhaler Inhale 2 puffs into the lungs every 6 (six) hours as needed for wheezing or shortness of breath. Patient not taking: Reported on 06/01/2015 09/14/14   Ladell Pier, MD  albuterol (PROVENTIL) (2.5 MG/3ML) 0.083% nebulizer solution Take 3 mLs (2.5 mg total) by nebulization every 6 (six) hours as needed for wheezing or shortness of breath. Patient not taking: Reported on 11/15/2014 09/14/14   Ladell Pier, MD  ciprofloxacin (CIPRO) 250 MG tablet Take 1 tablet (250 mg total) by mouth 2 (two) times daily. Patient not taking: Reported on 06/01/2015 02/08/15   Ladell Pier, MD  fentaNYL (DURAGESIC - DOSED MCG/HR) 75 MCG/HR Place 1 patch (75 mcg total) onto the skin every 3 (three) days. 06/21/15   Dorie Rank, MD  LORazepam (ATIVAN) 0.5 MG tablet Take one half tablet (0.25 mg) by mouth or under the tongue every six hours as needed for nausea. Patient not taking: Reported on 06/01/2015 01/13/15   Ladell Pier, MD  oxyCODONE-acetaminophen (  PERCOCET/ROXICET) 5-325 MG tablet Take 0.5-1 tablets by mouth every 6 (six) hours as needed for severe pain. 06/05/15   Ladell Pier, MD  polyethylene glycol [facility] Floria Raveling) packet Take 17 g by mouth daily. Patient not taking: Reported on 06/01/2015 11/11/14   Tatyana Kirichenko, PA-C  senna-docusate (SENOKOT-S) 8.6-50 MG per tablet Take 1 tablet by mouth 2 (two) times daily as needed. Patient taking differently: Take 1 tablet by mouth 2 (two) times daily as needed.  10/04/14   Ladell Pier, MD  SPIRIVA HANDIHALER 18 MCG inhalation capsule INHALE CONTENTS OF 1 CAPSULE ONCE DAILY USING HANDIHALER 02/08/15   Ladell Pier, MD  traMADol (ULTRAM) 50 MG tablet TAKE 1-2 TABLETS BY MOUTH EVERY 8 HOURS AS NEEDED FOR MODERATE PAIN  01/13/15   Ladell Pier, MD   BP 125/76 mmHg  Pulse 94  Temp(Src) 98.6 F (37 C) (Oral)  Resp 16  SpO2 100% Physical Exam  Constitutional: No distress.  Underweight  HENT:  Head: Normocephalic and atraumatic.  Right Ear: External ear normal.  Left Ear: External ear normal.  Eyes: Conjunctivae are normal. Right eye exhibits no discharge. Left eye exhibits no discharge. No scleral icterus.  Neck: Neck supple. No tracheal deviation present.  Cardiovascular: Normal rate, regular rhythm and intact distal pulses.   Pulmonary/Chest: Effort normal and breath sounds normal. No stridor. No respiratory distress. She has no wheezes. She has no rales.  Abdominal: Soft. Bowel sounds are normal. She exhibits no distension. There is no tenderness. There is no rebound and no guarding.  Musculoskeletal: She exhibits no edema or tenderness.  Neurological: She is alert. She has normal strength. No cranial nerve deficit (no facial droop, extraocular movements intact, no slurred speech) or sensory deficit. She exhibits normal muscle tone. She displays no seizure activity. Coordination normal.  Skin: Skin is warm and dry. No rash noted. She is not diaphoretic.  Psychiatric: She has a normal mood and affect.  Nursing note and vitals reviewed.   ED Course  Procedures (including critical care time)   MDM   Final diagnoses:  Metastatic cancer ([place])    Patient's symptoms had resolved prior to her evaluation in the emergency room. I had a long discussion with the patient as well as her husband and her daughter. The daughter mentions that the patient gets a little bit confused. Possible that she has not been clearly conveying the pain that she has been in. Patient told me that she was taking all of her pain medications and none of them are working. She also says that no one has been calling her back. I did review the notes in the computer system with her. This nurses have called Dr. Benay Spice for refills of  her medications. Ever there was no indication that they needed a refill of the fentanyl in the electronic record. There is also no mention that the medications have not been working.  The patient will contact Dr. Ammie Dalton  to discuss her pain management. Ever a refill of fentanyl 75 g which is increased from the 50 g that she was given previously since she does not feel that has helped enough.    Dorie Rank, MD 06/21/15 [id number]

## 2015-06-21 NOTE — ED Notes (Addendum)
Per [facility]- Pt resides at home. Pt under HOSPICE. Several attempts made to contact Hospice without success. Hospice visited several days ago "did nothing" per family. Pt has taken RX for pain without relief. Here for pain management. No there complaints  Fentanyl 100 mcg IVP given in route

## 2015-06-27 ENCOUNTER — Other Ambulatory Visit: Payer: Self-pay | Admitting: Oncology

## 2015-06-28 ENCOUNTER — Encounter: Payer: Self-pay | Admitting: *Deleted

## 2015-06-28 NOTE — Progress Notes (Signed)
Call received from [place] with [facility] to inform Dr. Benay Spice that pt has declined since her visit on Monday and is "shakey and aggitated" at this time.  [facility] is requesting order from Dr. Benay Spice to transfer patient to [facility] if bed is available.  Call placed to Dr. Benay Spice by L. Thomas NP and order received to transfer pt to [facility].  Return call placed to [facility] to inform her of OK for transfer per Dr. Benay Spice.

## 2015-07-17 ENCOUNTER — Encounter: Payer: Self-pay | Admitting: *Deleted

## 2015-07-17 NOTE — Progress Notes (Signed)
Call received from [facility] to inform Dr. Benay Spice that patient passed away on 08/10/15 at 10:50PM.

## 2015-07-18 ENCOUNTER — Telehealth: Payer: Self-pay | Admitting: *Deleted

## 2015-07-18 NOTE — Telephone Encounter (Signed)
Message from [place] with [employer] requesting death certificate to be returned. 1618: Message from pt's daughter requesting death certificate to be faxed to [facility] [employer]. Signed death certificate was taken to [facility] dept around 1330 today. Informed Bianca [name] they should receive it by 6/14 early AM.

## 2015-07-19 NOTE — Telephone Encounter (Signed)
1040: Spoke with [facility], they have not received faxed death certificate. Faxed death certificate copy to [facility] [employer]. Noted original copy on MD desk for signature.

## 2015-08-05 DEATH — deceased

## 2015-08-21 IMAGING — CT CT ABD-PELV W/ CM
2 of 5 series · 16 of 46 positions shown, 18 images · IV contrast (OMNIPAQUE)
Comparison: 11/29/2013.

CLINICAL DATA: Ovarian cancer with completed chemotherapy. Left hip
and left leg pain.

EXAM:
CT ABDOMEN AND PELVIS WITH CONTRAST
TECHNIQUE: Multidetector CT imaging of the abdomen and pelvis was performed
using the standard protocol following bolus administration of
intravenous contrast.
CONTRAST:  50mL OMNIPAQUE IOHEXOL 300 MG/ML SOLN, 80mL OMNIPAQUE
IOHEXOL 300 MG/ML SOLN

[Series 2: rtn a/p with · axial · 0.64mm/px · z∈[-400,-110]mm · 13 of 66 slices shown, 15 images]
[im 4/66  soft-tissue]
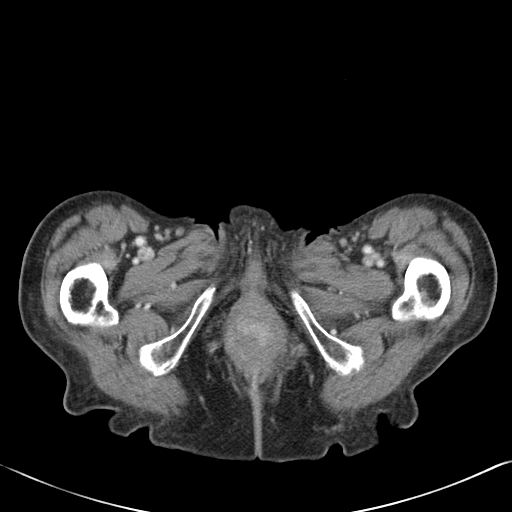
[im 4/66  bone]
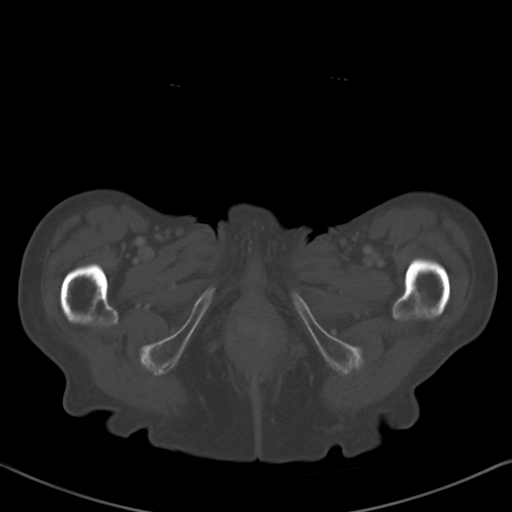
[im 11/66  soft-tissue]
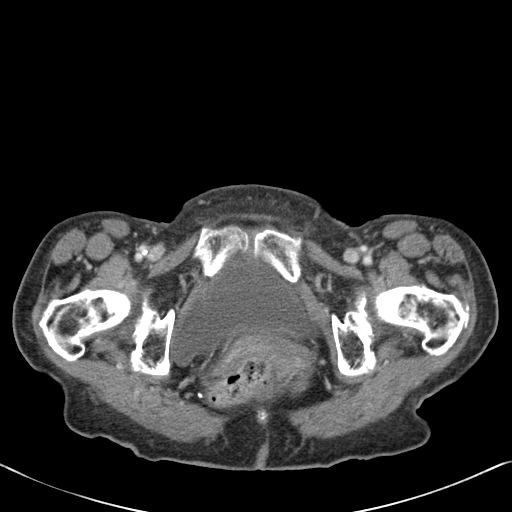
[im 14/66  soft-tissue]
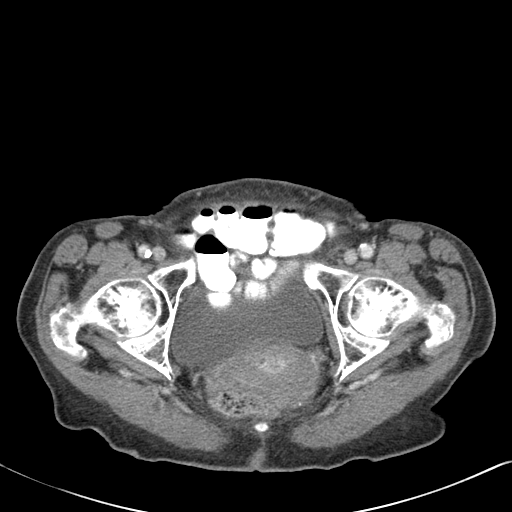
[im 18/66  soft-tissue]
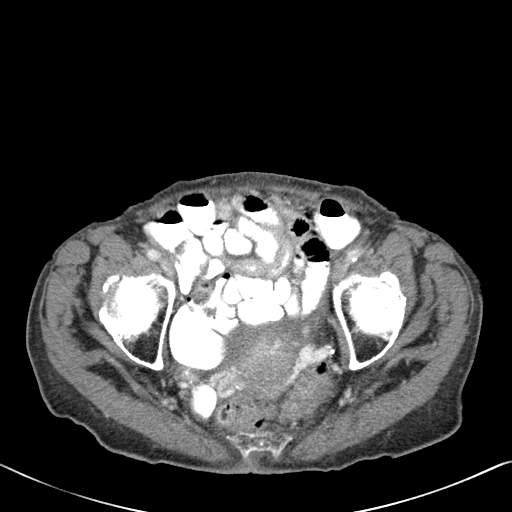
[im 24/66  soft-tissue]
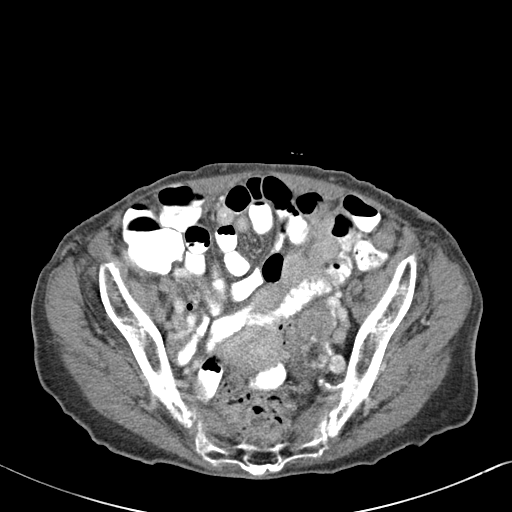
[im 28/66  soft-tissue]
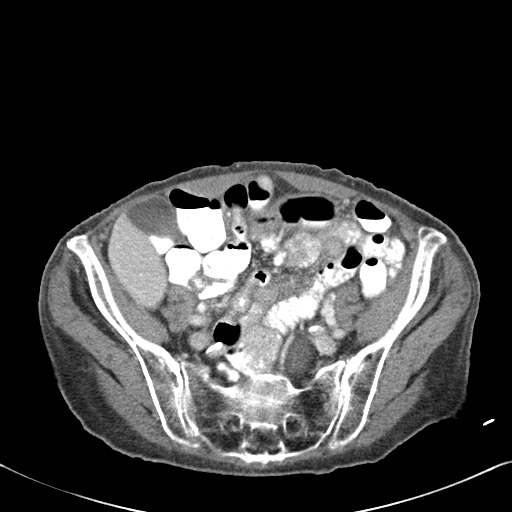
[im 35/66  soft-tissue]
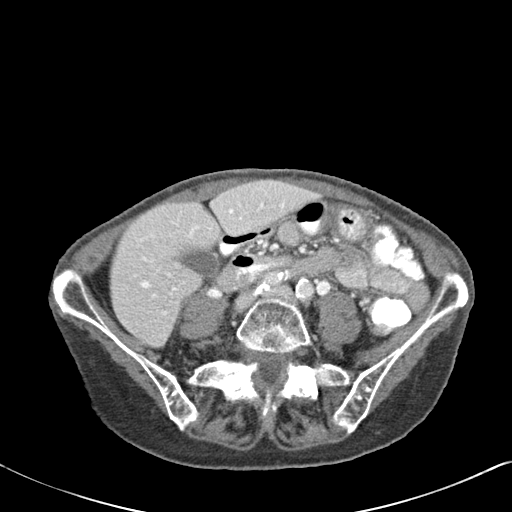
[im 38/66  soft-tissue]
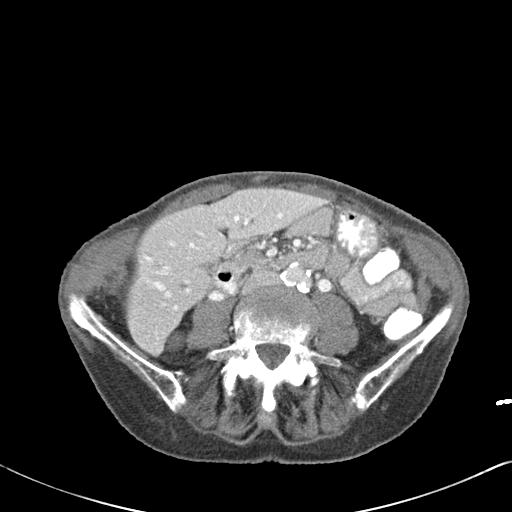
[im 42/66  soft-tissue]
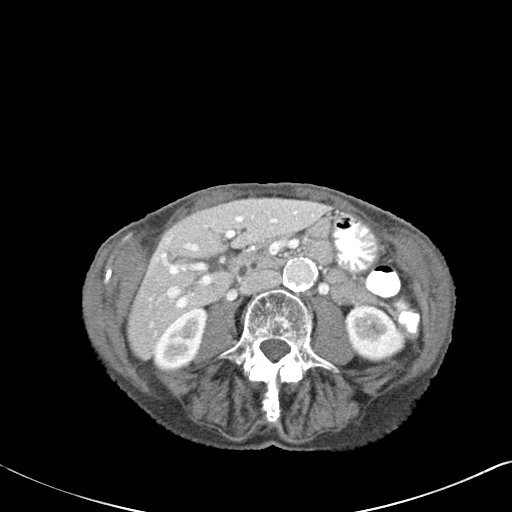
[im 42/66  bone]
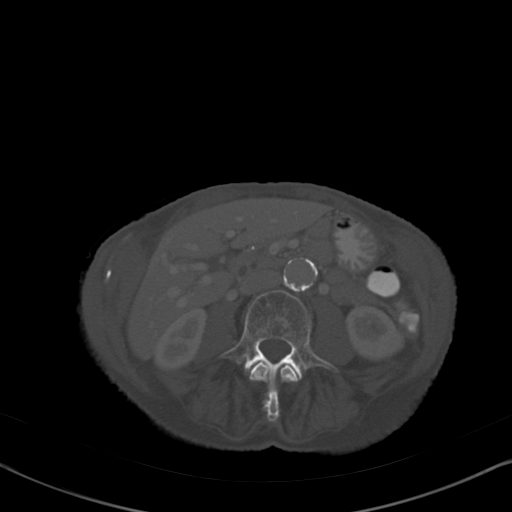
[im 48/66  soft-tissue]
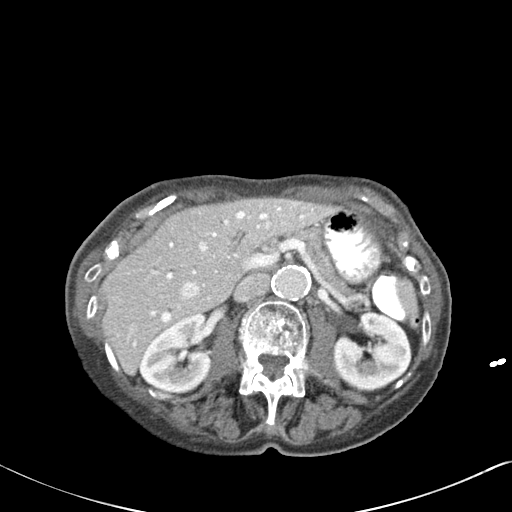
[im 52/66  soft-tissue]
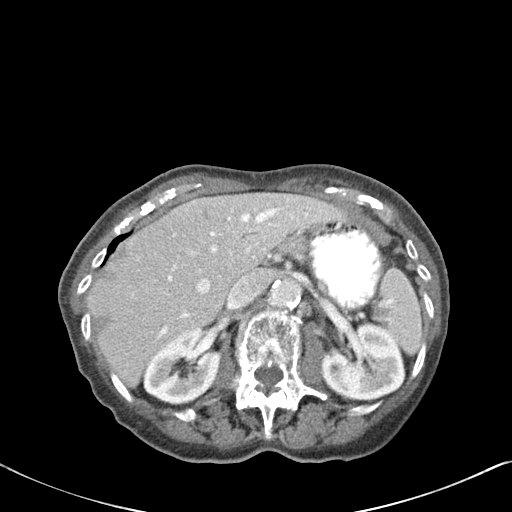
[im 55/66  soft-tissue]
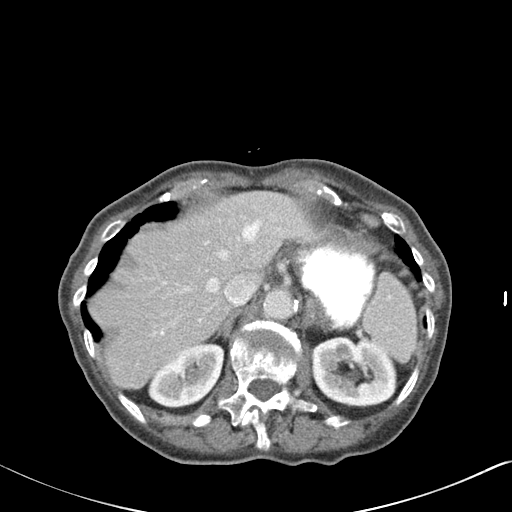
[im 62/66  soft-tissue]
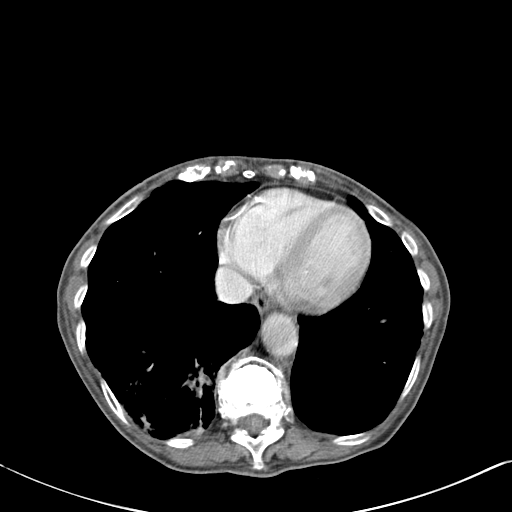

[Series 602: <mpr thick range> · coronal · 0.64mm/px · 3 of 75 slices shown]
[im 25/75  soft-tissue]
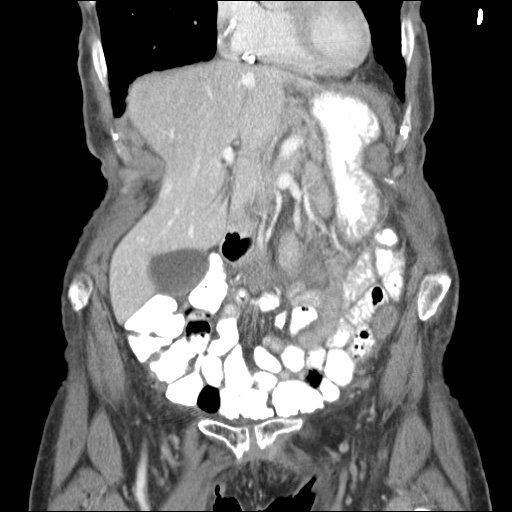
[im 33/75  soft-tissue]
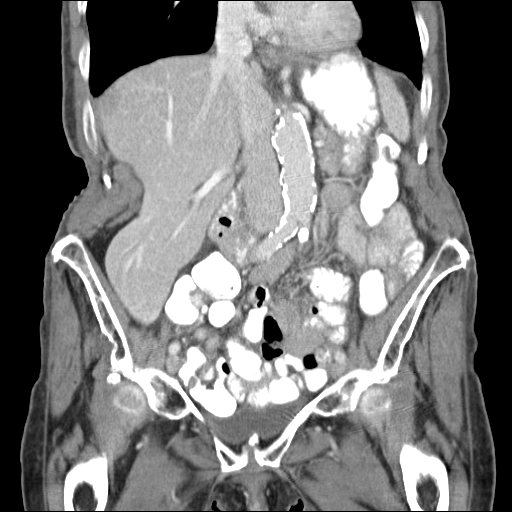
[im 42/75  soft-tissue]
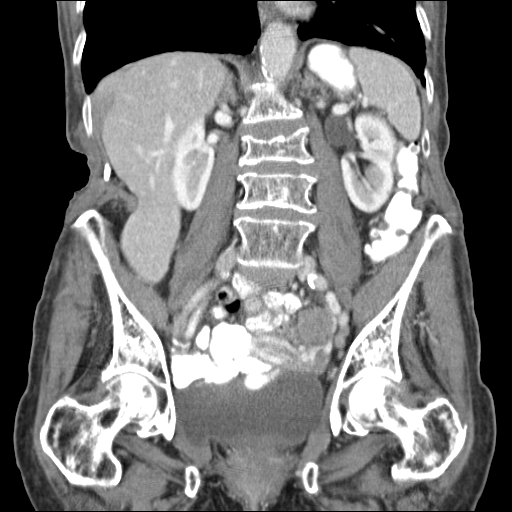

[16 of 46 positions shown; findings below may reference images not displayed]

FINDINGS: Lower chest: Mild scarring and volume loss in the right lower lobe.
Emphysema. Heart size normal. Coronary artery calcification. No
pericardial or pleural effusion.

Hepatobiliary: Liver is unremarkable. Mild intrahepatic and
extrahepatic biliary ductal dilatation appears unchanged.
Gallbladder is unremarkable.

Pancreas: Pancreatic duct measures at the upper limits of normal, 3
mm. Pancreas is otherwise unremarkable.

Spleen: Negative.

Adrenals/Urinary Tract: Adrenal glands are unremarkable.
Low-attenuation lesions in the kidneys measure up to 1.3 cm on the
left, are unchanged from 11/29/2013 and likely represent cysts.
Ureters are decompressed. Bladder is grossly unremarkable.

Stomach/Bowel: Stomach, small bowel, appendix and colon are grossly
unremarkable.

Vascular/Lymphatic: Atherosclerotic calcification of the arterial
vasculature without abdominal aortic aneurysm. Prominent pelvic
veins are seen associated with the uterus, which can be seen with
pelvic congestion syndrome. No pathologically enlarged lymph nodes.

Reproductive: Uterus is visualized. 2.5 cm low-attenuation lesion in
the left adnexa measures slightly smaller, previously 3.1 cm, and
may arise from the left ovary.

Other: Peritoneal implant is seen in the anterior left lower
quadrant, measuring 1.3 x 2.0 cm

(series 2, image 42), previously 2.0 x 2.6 cm. A peritoneal implant
is seen along the left pelvic sidewall, measuring 1.9 x 2.3 cm
(image 43), likely stable when remeasured on the prior study. 10 mm
nodule in the lower right anatomic pelvis/perisigmoid region (image
51) is stable or slightly decreased in size (1.4 x 1.5 cm).
Additional sites of peritoneal metastatic disease, previously seen
in the pelvis on 11/29/2013 are not readily identified, possibly due
to decompressed bowel. No free fluid.

Musculoskeletal: T12 vertebroplasty. L2 and L3 compression fractures
are stable. Prominent Schmorl's node along the inferior endplate of
L4.
IMPRESSION: Overall slight decrease in peritoneal metastatic burden with
decrease in size of measured lesions. Some lesions previously seen
in the low posterior anatomic pelvis are less well seen on the
current study, possibly due to lack of colonic distention with oral
contrast.

## 2016-03-03 ENCOUNTER — Encounter: Payer: Self-pay | Admitting: Student
